# Patient Record
Sex: Female | Born: 1975
Health system: Southern US, Community
[De-identification: ages and names within clinical notes are randomized; demographics above are authoritative.]

## PROBLEM LIST (undated history)

## (undated) ENCOUNTER — Inpatient Hospital Stay: Admission: EM | Payer: Self-pay

## (undated) DIAGNOSIS — F419 Anxiety disorder, unspecified: Secondary | ICD-10-CM

## (undated) DIAGNOSIS — F329 Major depressive disorder, single episode, unspecified: Secondary | ICD-10-CM

## (undated) DIAGNOSIS — G709 Myoneural disorder, unspecified: Secondary | ICD-10-CM

## (undated) DIAGNOSIS — M1990 Unspecified osteoarthritis, unspecified site: Secondary | ICD-10-CM

## (undated) DIAGNOSIS — N946 Dysmenorrhea, unspecified: Secondary | ICD-10-CM

## (undated) DIAGNOSIS — E669 Obesity, unspecified: Secondary | ICD-10-CM

## (undated) DIAGNOSIS — I10 Essential (primary) hypertension: Secondary | ICD-10-CM

## (undated) DIAGNOSIS — G8929 Other chronic pain: Secondary | ICD-10-CM

## (undated) DIAGNOSIS — T7840XA Allergy, unspecified, initial encounter: Secondary | ICD-10-CM

## (undated) DIAGNOSIS — F319 Bipolar disorder, unspecified: Secondary | ICD-10-CM

## (undated) DIAGNOSIS — F32A Depression, unspecified: Secondary | ICD-10-CM

## (undated) HISTORY — DX: Obesity, unspecified: E66.9

## (undated) HISTORY — DX: Major depressive disorder, single episode, unspecified: F32.9

## (undated) HISTORY — DX: Depression, unspecified: F32.A

## (undated) HISTORY — PX: WISDOM TOOTH EXTRACTION: SHX21

## (undated) HISTORY — DX: Dysmenorrhea, unspecified: N94.6

## (undated) HISTORY — DX: Anxiety disorder, unspecified: F41.9

## (undated) HISTORY — DX: Myoneural disorder, unspecified: G70.9

## (undated) HISTORY — DX: Allergy, unspecified, initial encounter: T78.40XA

## (undated) HISTORY — DX: Unspecified osteoarthritis, unspecified site: M19.90

## (undated) HISTORY — PX: SPINE SURGERY: SHX786

## (undated) HISTORY — DX: Other chronic pain: G89.29

---

## 2001-09-21 HISTORY — PX: KNEE ARTHROSCOPY: SUR90

## 2005-09-21 HISTORY — PX: SHOULDER ARTHROSCOPY: SHX128

## 2005-09-21 HISTORY — PX: KNEE ARTHROSCOPY: SUR90

## 2005-09-21 HISTORY — PX: KIDNEY STONE SURGERY: SHX686

## 2009-09-29 ENCOUNTER — Emergency Department (HOSPITAL_COMMUNITY): Admission: EM | Admit: 2009-09-29 | Discharge: 2009-09-29 | Payer: Self-pay | Admitting: Family Medicine

## 2009-12-01 ENCOUNTER — Emergency Department (HOSPITAL_COMMUNITY): Admission: EM | Admit: 2009-12-01 | Discharge: 2009-12-01 | Payer: Self-pay | Admitting: Emergency Medicine

## 2010-04-23 ENCOUNTER — Emergency Department (HOSPITAL_COMMUNITY): Admission: EM | Admit: 2010-04-23 | Discharge: 2010-04-23 | Payer: Self-pay | Admitting: Family Medicine

## 2010-05-21 ENCOUNTER — Emergency Department (HOSPITAL_COMMUNITY): Admission: EM | Admit: 2010-05-21 | Discharge: 2010-05-21 | Payer: Self-pay | Admitting: Family Medicine

## 2010-07-04 ENCOUNTER — Ambulatory Visit: Payer: Self-pay | Admitting: Internal Medicine

## 2010-07-04 LAB — CONVERTED CEMR LAB
ALT: 54 units/L — ABNORMAL HIGH (ref 0–35)
AST: 35 units/L (ref 0–37)
Albumin: 4.7 g/dL (ref 3.5–5.2)
CO2: 26 meq/L (ref 19–32)
Calcium: 10.8 mg/dL — ABNORMAL HIGH (ref 8.4–10.5)
Creatinine, Ser: 0.78 mg/dL (ref 0.40–1.20)
Glucose, Bld: 84 mg/dL (ref 70–99)
Total Bilirubin: 0.3 mg/dL (ref 0.3–1.2)
Total Protein: 7.4 g/dL (ref 6.0–8.3)

## 2010-07-31 ENCOUNTER — Emergency Department (HOSPITAL_COMMUNITY)
Admission: EM | Admit: 2010-07-31 | Discharge: 2010-07-31 | Payer: Self-pay | Source: Home / Self Care | Admitting: Family Medicine

## 2010-11-10 ENCOUNTER — Inpatient Hospital Stay (INDEPENDENT_AMBULATORY_CARE_PROVIDER_SITE_OTHER)
Admission: RE | Admit: 2010-11-10 | Discharge: 2010-11-10 | Disposition: A | Discharge status: Home / Self Care | Payer: Self-pay | Source: Ambulatory Visit | Attending: Emergency Medicine | Admitting: Emergency Medicine

## 2010-11-10 DIAGNOSIS — L259 Unspecified contact dermatitis, unspecified cause: Secondary | ICD-10-CM

## 2010-11-10 DIAGNOSIS — J019 Acute sinusitis, unspecified: Secondary | ICD-10-CM

## 2010-11-10 LAB — POCT URINALYSIS DIPSTICK: Specific Gravity, Urine: 1.025 (ref 1.005–1.030)

## 2010-11-10 LAB — COMPREHENSIVE METABOLIC PANEL
ALT: 32 U/L (ref 0–35)
Alkaline Phosphatase: 51 U/L (ref 39–117)
Chloride: 106 mEq/L (ref 96–112)
Creatinine, Ser: 0.66 mg/dL (ref 0.4–1.2)
GFR calc non Af Amer: >60 mL/min (ref >60)
Glucose, Bld: 110 mg/dL — ABNORMAL HIGH (ref 70–99)
Total Bilirubin: 0.4 mg/dL (ref 0.3–1.2)
Total Protein: 7.2 g/dL (ref 6.0–8.3)

## 2010-11-10 LAB — DIFFERENTIAL
Basophils Absolute: 0.1 10*3/uL (ref 0.0–0.1)
Basophils Relative: 0 % (ref 0–1)
Eosinophils Absolute: 0.4 10*3/uL (ref 0.0–0.7)
Eosinophils Relative: 3 % (ref 0–5)
Lymphocytes Relative: 20 % (ref 12–46)
Lymphs Abs: 2.5 10*3/uL (ref 0.7–4.0)
Monocytes Relative: 7 % (ref 3–12)
Neutro Abs: 8.7 10*3/uL — ABNORMAL HIGH (ref 1.7–7.7)

## 2010-11-10 LAB — CBC
MCH: 28 pg (ref 26.0–34.0)
RBC: 4.78 MIL/uL (ref 3.87–5.11)
WBC: 12.5 10*3/uL — ABNORMAL HIGH (ref 4.0–10.5)

## 2010-12-05 LAB — POCT I-STAT, CHEM 8
HCT: 43 % (ref 36.0–46.0)
TCO2: 24 mmol/L (ref 0–100)

## 2010-12-15 LAB — POCT RAPID STREP A (OFFICE): Streptococcus, Group A Screen (Direct): NEGATIVE

## 2011-02-17 ENCOUNTER — Other Ambulatory Visit: Payer: Self-pay | Admitting: Internal Medicine

## 2011-02-17 NOTE — Telephone Encounter (Addendum)
Is this our patient?  Previous patient of Dr. Reche Dixon at [facility].  Never seen in [facility].  Prescription refill request was refused and sent back to the pharmacy with reason.

## 2011-07-15 ENCOUNTER — Inpatient Hospital Stay (INDEPENDENT_AMBULATORY_CARE_PROVIDER_SITE_OTHER)
Admission: RE | Admit: 2011-07-15 | Discharge: 2011-07-15 | Disposition: A | Discharge status: Home / Self Care | Payer: Self-pay | Source: Ambulatory Visit | Attending: Family Medicine | Admitting: Family Medicine

## 2011-07-15 DIAGNOSIS — G43009 Migraine without aura, not intractable, without status migrainosus: Secondary | ICD-10-CM

## 2011-10-28 ENCOUNTER — Encounter (HOSPITAL_COMMUNITY): Payer: Self-pay | Admitting: Emergency Medicine

## 2011-10-28 ENCOUNTER — Emergency Department (INDEPENDENT_AMBULATORY_CARE_PROVIDER_SITE_OTHER)
Admission: EM | Admit: 2011-10-28 | Discharge: 2011-10-28 | Disposition: A | Discharge status: Home / Self Care | Payer: Self-pay | Source: Home / Self Care

## 2011-10-28 DIAGNOSIS — R05 Cough: Secondary | ICD-10-CM

## 2011-10-28 DIAGNOSIS — R059 Cough, unspecified: Secondary | ICD-10-CM

## 2011-10-28 DIAGNOSIS — J019 Acute sinusitis, unspecified: Secondary | ICD-10-CM

## 2011-10-28 HISTORY — DX: Essential (primary) hypertension: I10

## 2011-10-28 MED ORDER — AMOXICILLIN 875 MG PO TABS: 875.0000 mg | ORAL_TABLET | Freq: Two times a day (BID) | ORAL | Status: AC

## 2011-10-28 NOTE — ED Provider Notes (Signed)
History     CSN: [id number]  Arrival date & time 10/28/11  1026   None     Chief Complaint  Patient presents with  . Sinusitis  . URI    (Consider location/radiation/quality/duration/timing/severity/associated sxs/prior treatment) HPI Comments: Patient presents with c/o sinus pressure, nasal congestion, post nasal drainage and cough x 1 week. Her chest hurts with deep breath only. Cough comes in "coughing fits" and is usually nonproductive. Her throat is sore from post nasal drainage and her ears are also beginning to hurt. She has been taking an over the counter decongestant without improvement.    Past Medical History  Diagnosis Date  . Hypertension     Past Surgical History  Procedure Date  . Shoulder arthroscopy   . Knee arthroscopy   . Kidney stone surgery     History reviewed. No pertinent family history.  History  Substance Use Topics  . Smoking status: Never Smoker   . Smokeless tobacco: Not on file  . Alcohol Use: No    OB History    Grav Para Term Preterm Abortions TAB SAB Ect Mult Living                  Review of Systems  Constitutional: Negative for fever, chills and fatigue.  HENT: Positive for ear pain, congestion, sore throat, rhinorrhea, postnasal drip and sinus pressure.   Respiratory: Positive for cough. Negative for shortness of breath and wheezing.   Cardiovascular: Negative for chest pain and palpitations.    Allergies  Clindamycin/lincomycin and Nubain  Home Medications   Current Outpatient Rx  Name Route Sig Dispense Refill  . CLONIDINE HCL 0.1 MG PO TABS Oral Take 0.1 mg by mouth 2 (two) times daily. RAN OUT    . CLOZAPINE 100 MG PO TABS Oral Take 100 mg by mouth daily.    Marland Kitchen GABAPENTIN 100 MG PO CAPS Oral Take 100 mg by mouth 3 (three) times daily.    Marland Kitchen LAMOTRIGINE 200 MG PO TABS Oral Take 200 mg by mouth daily.    Marland Kitchen LITHIUM CARBONATE 600 MG PO CAPS Oral Take 600 mg by mouth 3 (three) times daily with meals.    Marland Kitchen QUETIAPINE  FUMARATE 200 MG PO TABS Oral Take 200 mg by mouth at bedtime.    . AMOXICILLIN 875 MG PO TABS Oral Take 1 tablet (875 mg total) by mouth 2 (two) times daily. 20 tablet 0    BP 137/82  Pulse 100  Temp(Src) 98.2 F (36.8 C) (Oral)  Resp 16  SpO2 98%  LMP 09/14/2011  Physical Exam  Nursing note and vitals reviewed. Constitutional: She appears well-developed and well-nourished. No distress.  HENT:  Head: Normocephalic and atraumatic.  Right Ear: Tympanic membrane, external ear and ear canal normal.  Left Ear: Tympanic membrane, external ear and ear canal normal.  Nose: Mucosal edema (severe) present. Right sinus exhibits maxillary sinus tenderness. Left sinus exhibits maxillary sinus tenderness.  Mouth/Throat: Uvula is midline, oropharynx is clear and moist and mucous membranes are normal. No oropharyngeal exudate, posterior oropharyngeal edema or posterior oropharyngeal erythema.  Neck: Neck supple.  Cardiovascular: Normal rate, regular rhythm and normal heart sounds.   Pulmonary/Chest: Effort normal and breath sounds normal. No respiratory distress.  Lymphadenopathy:    She has no cervical adenopathy.  Neurological: She is alert.  Skin: Skin is warm and dry.  Psychiatric: She has a normal mood and affect.    ED Course  Procedures (including critical care time)  Labs Reviewed -  No data to display No results found.   1. Acute sinusitis   2. Cough       MDM          Melody Comas, PA 10/28/11 1128

## 2011-10-28 NOTE — ED Notes (Signed)
PT HERE WITH SINUS SX THAT STARTED LAST WEEK WITH SORE THROAT,INTERMITT COUGH WITH MIN GREEN PHLEGM THROB H/A AND NOW BILAT EARS DISCOMFORT.PT ALSO REPORTS OF FREQ CHEST PRESSURE WITH REST OR MOVEMENT.DENIES VOMITING BUT C/O NAUSEA.PT HAS BEEN TAKING OTC SINUS COLD MEDS BUT NO RELIEF

## 2011-11-02 NOTE — ED Provider Notes (Signed)
Medical screening examination/treatment/procedure(s) were performed by non-physician practitioner and as supervising physician I was immediately available for consultation/collaboration.  Corrie Mckusick, MD 11/02/11 1433

## 2011-11-17 ENCOUNTER — Emergency Department (INDEPENDENT_AMBULATORY_CARE_PROVIDER_SITE_OTHER)
Admission: EM | Admit: 2011-11-17 | Discharge: 2011-11-17 | Disposition: A | Discharge status: Home / Self Care | Payer: Self-pay | Source: Home / Self Care | Attending: Emergency Medicine | Admitting: Emergency Medicine

## 2011-11-17 ENCOUNTER — Encounter (HOSPITAL_COMMUNITY): Payer: Self-pay | Admitting: Emergency Medicine

## 2011-11-17 DIAGNOSIS — J040 Acute laryngitis: Secondary | ICD-10-CM

## 2011-11-17 DIAGNOSIS — J329 Chronic sinusitis, unspecified: Secondary | ICD-10-CM

## 2011-11-17 HISTORY — DX: Bipolar disorder, unspecified: F31.9

## 2011-11-17 LAB — POCT RAPID STREP A: Streptococcus, Group A Screen (Direct): NEGATIVE

## 2011-11-17 MED ORDER — CEFDINIR 300 MG PO CAPS: 300.0000 mg | ORAL_CAPSULE | Freq: Two times a day (BID) | ORAL | Status: AC

## 2011-11-17 NOTE — ED Notes (Signed)
Repositioned exam table, offered blankets, given pillow/dimmed lights

## 2011-11-17 NOTE — Discharge Instructions (Signed)
Laryngitis At the top of your windpipe is your voice box. It is the source of your voice. Inside your voice box are 2 bands of muscles called vocal cords. When you breathe, your vocal cords are relaxed and open so that air can get into the lungs. When you decide to say something, these cords come together and vibrate. The sound from these vibrations goes into your throat and comes out through your mouth as sound. Laryngitis is an inflammation of the vocal cords that causes hoarseness, cough, loss of voice, sore throat, and dry throat. Laryngitis can often be related to a viral infection, excessive smoking, excessive talking or yelling, inhalation of toxic fumes, allergies, and reflux of acid from your stomach. CAUSES Laryngitis can be temporary (acute) or long-term (chronic). Most cases of acute laryngitis improve with time. The typical cause of acute laryngitis is viral infection, vocal strain, measles or mumps, or bacterial infections. Chronic laryngitis lasts for more than 3 weeks. It is usually caused by vocal cord strain, vocal cord injury, postnasal drip, growths on the vocal cords, or acid reflux. RISK FACTORS  Respiratory infections.   Exposure to irritating substances, such as cigarette smoke, excessive amounts of alcohol, stomach acids, and workplace chemicals.   Voice trauma, such as vocal cord injury from shouting or speaking too loud.  DIAGNOSIS  The most common sign of laryngitis is hoarseness. This can range from partial to total loss of your voice. Laryngoscopy may be necessary. This allows your caregiver to look into the larynx in order to diagnose your condition. HOME CARE INSTRUCTIONS  Drink enough fluids to keep your urine clear or pale yellow.   Rest until you no longer have symptoms or as directed by your caregiver.   Breathe in moist air.   Take all medicine as directed by your caregiver.   Do not smoke.   Talk as little as possible (this includes whispering).    Write on paper instead of talking until your voice is back to normal.   Follow up with your caregiver if your condition has not improved after 10 days.  SEEK MEDICAL CARE IF:   You have trouble breathing.   You cough up blood.   You have persistent fever.   You have increasing pain.   You have difficulty swallowing.  Document Released: 09/07/2005 Document Revised: 05/20/2011 Document Reviewed: 11/13/2010 [facility] Patient Information 2012 [place], [place].Sinusitis Sinuses are air pockets within the bones of your face. The growth of bacteria within a sinus leads to infection. The infection prevents the sinuses from draining. This infection is called sinusitis. SYMPTOMS  There will be different areas of pain depending on which sinuses have become infected.  The maxillary sinuses often produce pain beneath the eyes.   Frontal sinusitis may cause pain in the middle of the forehead and above the eyes.  Other problems (symptoms) include:  Toothaches.   Colored, pus-like (purulent) drainage from the nose.   Swelling, warmth, and tenderness over the sinus areas may be signs of infection.  TREATMENT  Sinusitis is most often determined by an exam.X-rays may be taken. If x-rays have been taken, make sure you obtain your results or find out how you are to obtain them. Your caregiver may give you medications (antibiotics). These are medications that will help kill the bacteria causing the infection. You may also be given a medication (decongestant) that helps to reduce sinus swelling.  HOME CARE INSTRUCTIONS   Only take over-the-counter or prescription medicines for pain, discomfort, or  fever as directed by your caregiver.   Drink extra fluids. Fluids help thin the mucus so your sinuses can drain more easily.   Applying either moist heat or ice packs to the sinus areas may help relieve discomfort.   Use saline nasal sprays to help moisten your sinuses. The sprays can be found at your  local drugstore.  SEEK IMMEDIATE MEDICAL CARE IF:  You have a fever.   You have increasing pain, severe headaches, or toothache.   You have nausea, vomiting, or drowsiness.   You develop unusual swelling around the face or trouble seeing.  MAKE SURE YOU:   Understand these instructions.   Will watch your condition.   Will get help right away if you are not doing well or get worse.  Document Released: 09/07/2005 Document Revised: 05/20/2011 Document Reviewed: 04/06/2007 [facility] Patient Information 2012 [place], [place].

## 2011-11-17 NOTE — ED Provider Notes (Signed)
Chief Complaint  Patient presents with  . Sore Throat    History of Present Illness:   The patient has had a six-day history of a sore throat, hoarseness, sore ears, headache, and a slight cough. She had the same thing about 2 weeks ago and was told she had sinusitis. She was given amoxicillin and finished this up. She feels better, but doesn't feel like her symptoms have completely gone away. She denies fever, chills, nausea, vomiting, or diarrhea. She still does have some nasal congestion and drainage.  Review of Systems:  Other than noted above, the patient denies any of the following symptoms. Systemic:  No fever, chills, sweats, fatigue, myalgias, headache, or anorexia. Eye:  No redness, pain or drainage. ENT:  No earache, nasal congestion, rhinorrhea, sinus pressure, or sore throat. Lungs:  No cough, sputum production, wheezing, shortness of breath. Or chest pain. GI:  No nausea, vomiting, abdominal pain or diarrhea. Skin:  No rash or itching.  PMFSH:  Past medical history, family history, social history, meds, and allergies were reviewed.  Physical Exam:   Vital signs:  BP 125/78  Pulse 81  Temp(Src) 98.5 F (36.9 C) (Oral)  Resp 18  SpO2 99%  LMP 10/14/2010 General:  Alert, in no distress. Eye:  No conjunctival injection or drainage. ENT:  TMs and canals were normal, without erythema or inflammation.  Nasal mucosa was clear and uncongested, without drainage.  Mucous membranes were moist.  Pharynx was clear, without exudate or drainage.  There were no oral ulcerations or lesions. Neck:  Supple, no adenopathy, tenderness or mass. Lungs:  No respiratory distress.  Lungs were clear to auscultation, without wheezes, rales or rhonchi.  Breath sounds were clear and equal bilaterally. Heart:  Regular rhythm, without gallops, murmers or rubs. Skin:  Clear, warm, and dry, without rash or lesions.  Labs:   Results for orders placed during the hospital encounter of 11/17/11  POCT RAPID  STREP A ([facility] URG CARE ONLY)      Component Value Range   Streptococcus, Group A Screen (Direct) NEGATIVE  NEGATIVE      Radiology:  No results found.  Assessment:   Diagnoses that have been ruled out:  None  Diagnoses that are still under consideration:  None  Final diagnoses:  Laryngitis  Sinusitis      Plan:   1.  The following meds were prescribed:   New Prescriptions   CEFDINIR (OMNICEF) 300 MG CAPSULE    Take 1 capsule (300 mg total) by mouth 2 (two) times daily.   2.  The patient was instructed in symptomatic care and handouts were given. 3.  The patient was told to return if becoming worse in any way, if no better in 3 or 4 days, and given some red flag symptoms that would indicate earlier return.   Roque Lias, MD 11/17/11 1710

## 2011-11-17 NOTE — ED Notes (Signed)
Reports onset of symptoms was 11/14/2011.  Reports sore throat, headache, laryngitis.

## 2011-11-24 ENCOUNTER — Other Ambulatory Visit: Payer: Self-pay | Admitting: Family Medicine

## 2012-02-01 ENCOUNTER — Encounter: Payer: Self-pay | Admitting: Physical Medicine and Rehabilitation

## 2012-02-05 ENCOUNTER — Ambulatory Visit (HOSPITAL_BASED_OUTPATIENT_CLINIC_OR_DEPARTMENT_OTHER): Payer: Self-pay | Attending: Family Medicine

## 2012-02-05 VITALS — Ht 65.0 in | Wt 255.0 lb

## 2012-02-05 DIAGNOSIS — G4733 Obstructive sleep apnea (adult) (pediatric): Secondary | ICD-10-CM | POA: Insufficient documentation

## 2012-02-13 DIAGNOSIS — G4733 Obstructive sleep apnea (adult) (pediatric): Secondary | ICD-10-CM

## 2012-02-14 NOTE — Procedures (Signed)
NAME:  Maria Powers, Maria Powers NO.:  [id number]  MEDICAL RECORD NO.:  [id number]          PATIENT TYPE:  OUT  LOCATION:  SLEEP CENTER                 FACILITY[facility]  [facility]  PHYSICIAN:  Veasna Santibanez D. Maple Hudson, MD, FCCP, FACPDATE OF BIRTH:  05-11-1977  DATE OF STUDY:  02/05/2012                           NOCTURNAL POLYSOMNOGRAM  REFERRING PHYSICIAN:  Jaclyn Shaggy, MD  REFERRING PHYSICIAN:  Jaclyn Shaggy, MD  INDICATION FOR STUDY:  Hypersomnia with sleep apnea.  EPWORTH SLEEPINESS SCORE:  4/24.  BMI 42.4, weight 255 pounds, height 65 inches, neck 16 inches.  MEDICATIONS:  Home medications are charted and reviewed.  SLEEP ARCHITECTURE:  Total sleep time 391 minute with sleep efficiency 91.8%.  Stage I was 5.5%, stage II, 80.7%, stage III absent.  REM 13.8% of total sleep time.  Sleep latency 20.5 minutes.  REM latency 209 minutes.  Awake after sleep onset 15 minutes.  Arousal index 5.2.  BEDTIME MEDICATION:  Lithium, Seroquel, Lamictal, Neurontin, clonazepam, clonidine, Lo/Ovral, tramadol.  RESPIRATORY DATA:  Apnea-hypopnea index (AHI) 1.1 per hour.  A total of 7 events were scored all as hypopneas associated with supine sleep position and REM.  REM AHI 5.6 per hour.  There were insufficient numbers of events to meet protocol requirements for initiation of split protocol CPAP titration on this study night.  OXYGEN DATA:  Moderately loud snoring with oxygen desaturation to a nadir of 88% and mean oxygen saturation through the study of 95.4% on room air.  CARDIAC DATA:  Sinus rhythm with occasional PVC.  MOVEMENT-PARASOMNIA:  No significant movement disturbance.  No bathroom trips.  IMPRESSIONS-RECOMMENDATIONS: 1. Occasional respiratory event with sleep disturbance, within normal     limits.  AHI 1.1 per hour (the normal range for adults is from 0-5     events per hour).  Moderately loud snoring with oxygen desaturation     to a nadir of 88% and mean oxygen saturation through  the study of     95.4% on room air. 2. Her complaint is of daytime sleepiness and difficulty     concentrating.  Consider effects of her significant medication     list.     Prithvi Kooi D. Maple Hudson, MD, [facility] [place], FACP Diplomate, American Board of Sleep Medicine    CDY/MEDQ  D:  02/13/2012 16:18:00  T:  02/14/2012 00:20:05  Job:  [id number]

## 2012-05-18 ENCOUNTER — Encounter: Payer: Self-pay | Admitting: Physical Medicine and Rehabilitation

## 2012-05-18 ENCOUNTER — Ambulatory Visit (HOSPITAL_COMMUNITY)
Admission: RE | Admit: 2012-05-18 | Discharge: 2012-05-18 | Disposition: A | Discharge status: Home / Self Care | Payer: Self-pay | Source: Ambulatory Visit | Attending: Physical Medicine and Rehabilitation | Admitting: Physical Medicine and Rehabilitation

## 2012-05-18 ENCOUNTER — Encounter: Payer: Self-pay | Attending: Physical Medicine and Rehabilitation | Admitting: Physical Medicine and Rehabilitation

## 2012-05-18 VITALS — BP 152/97 | HR 78 | Resp 14 | Ht 64.0 in | Wt 241.0 lb

## 2012-05-18 DIAGNOSIS — I10 Essential (primary) hypertension: Secondary | ICD-10-CM | POA: Insufficient documentation

## 2012-05-18 DIAGNOSIS — M545 Low back pain, unspecified: Secondary | ICD-10-CM | POA: Insufficient documentation

## 2012-05-18 DIAGNOSIS — M5144 Schmorl's nodes, thoracic region: Secondary | ICD-10-CM | POA: Insufficient documentation

## 2012-05-18 DIAGNOSIS — M79604 Pain in right leg: Secondary | ICD-10-CM | POA: Insufficient documentation

## 2012-05-18 DIAGNOSIS — Q7649 Other congenital malformations of spine, not associated with scoliosis: Secondary | ICD-10-CM | POA: Insufficient documentation

## 2012-05-18 DIAGNOSIS — F319 Bipolar disorder, unspecified: Secondary | ICD-10-CM | POA: Insufficient documentation

## 2012-05-18 MED ORDER — DICLOFENAC SODIUM 1 % TD GEL: 2.0000 g | Freq: Four times a day (QID) | TRANSDERMAL | Status: DC

## 2012-05-18 NOTE — Patient Instructions (Signed)
Advised patient to walk , start walking for about 15 min, and then slowly increase as tolerated.

## 2012-05-18 NOTE — Progress Notes (Signed)
Subjective:    Patient ID: Maria Powers, female    DOB: 1975/09/27, 36 y.o.   MRN: [id number]  HPI The patient is a 36 year old female, who presents with LBP which radiates into the right posterior hip . The symptoms started 13 years ago, when she got pregnant. The patient complains about moderate to severe pain, which has gotten worse in the last couple month, but now she is back to baseline. She describes the pain as dull and aching pain . Applying heat,or ice, taking medications , changing positions alleviate the symptoms. Prolonged sitting aggrevates the symptoms. The patient grades her pain as a 2 /10. Patient takes Tramadol and Flexeril 1-2 times per day. Had ESI, with no success, triggerpoint injections helped some. Has not tried PT. Last MRI, X-ray over 36 years old. Pain Inventory Average Pain 2 Pain Right Now 2 My pain is intermittent  In the last 24 hours, has pain interfered with the following? General activity 0 Relation with others 0 Enjoyment of life 0 What TIME of day is your pain at its worst? varies Sleep (in general) Fair  Pain is worse with: some activites Pain improves with: not sure Relief from Meds: none  Mobility walk without assistance how many minutes can you walk? 10-15 ability to climb steps?  yes do you drive?  yes Do you have any goals in this area?  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  no  Neuro/Psych anxiety  Prior Studies Any changes since last visit?  no  Physicians involved in your care Any changes since last visit?  no   Family History  Problem Relation Age of Onset  . Addison's disease Mother   . Diabetes Mother   . Bipolar disorder Mother    History   Social History  . Marital Status: Single    Spouse Name: N/A    Number of Children: N/A  . Years of Education: N/A   Social History Main Topics  . Smoking status: Never Smoker   . Smokeless tobacco: None  .  Alcohol Use: No  . Drug Use: No  . Sexually Active:    Other Topics Concern  . None   Social History Narrative  . None   Past Surgical History  Procedure Date  . Shoulder arthroscopy   . Knee arthroscopy   . Kidney stone surgery    Past Medical History  Diagnosis Date  . Hypertension   . Bipolar 1 disorder    BP 152/97  Pulse 78  Resp 14  Ht 5\' 4"  (1.626 m)  Wt 241 lb (109.317 kg)  BMI 41.37 kg/m2  SpO2 100%     Review of Systems  Musculoskeletal: Positive for myalgias, back pain and arthralgias.  Psychiatric/Behavioral: The patient is nervous/anxious.   All other systems reviewed and are negative.       Objective:   Physical Exam  Constitutional: She is oriented to person, place, and time. She appears well-developed and well-nourished.  HENT:  Head: Normocephalic.  Neck: Neck supple.  Musculoskeletal: She exhibits tenderness.  Neurological: She is alert and oriented to person, place, and time.  Skin: Skin is warm and dry.  Psychiatric: She has a normal mood and affect.    Symmetric normal motor tone is noted throughout. Normal muscle bulk. Muscle testing reveals 5/5 muscle strength of the upper extremity, and 5/5 of the lower extremity. Full range of motion in upper and lower  extremities. ROM of spine is not restricted. Fine motor movements are normal in both hands. Sensory is intact and symmetric to light touch, pinprick and proprioception. DTR in the upper and lower extremity are present and symmetric 2+. No clonus is noted.  Patient arises from chair without difficulty. Narrow based gait with normal arm swing bilateral , able to stand on heels and toes . Tandem walk is stable. No pronator drift. Rhomberg negative. Good finger to nose and heel to shin testing. No tremor, dystaxia or dysmetria noted.  Patient is obese and deconditioned.       Assessment & Plan:  This is a 36 year old female female with 1.LBP 2.Bipolar   Plan : X-ray of L-spine,  consider treatment after, consider PT. Prescribed Voltaren gel for intermittend inflammation/ exacerbation, of L-spine/SI joint. Patient is on Lithium, considered prescribing an oral anti-inflammatory, but there is too much interaction, risk of Lithium toxicity. Follow up in 1 month

## 2012-06-07 ENCOUNTER — Emergency Department (INDEPENDENT_AMBULATORY_CARE_PROVIDER_SITE_OTHER): Payer: Self-pay

## 2012-06-07 ENCOUNTER — Emergency Department (INDEPENDENT_AMBULATORY_CARE_PROVIDER_SITE_OTHER)
Admission: EM | Admit: 2012-06-07 | Discharge: 2012-06-07 | Disposition: A | Discharge status: Home / Self Care | Payer: Self-pay | Source: Home / Self Care | Attending: Family Medicine | Admitting: Family Medicine

## 2012-06-07 ENCOUNTER — Encounter (HOSPITAL_COMMUNITY): Payer: Self-pay | Admitting: *Deleted

## 2012-06-07 DIAGNOSIS — S93609A Unspecified sprain of unspecified foot, initial encounter: Secondary | ICD-10-CM

## 2012-06-07 DIAGNOSIS — S93602A Unspecified sprain of left foot, initial encounter: Secondary | ICD-10-CM

## 2012-06-07 MED ORDER — DICLOFENAC POTASSIUM 50 MG PO TABS: 50.0000 mg | ORAL_TABLET | Freq: Three times a day (TID) | ORAL | Status: DC

## 2012-06-07 NOTE — ED Notes (Signed)
Pt  Reports  Symptoms  Of  Headache   l  Earache         With  Pain      Pain l  Side face /  Head    No  Vomiting  Pt  Reports  Symptoms  Since  Beginning of  Last  Month        -  She  Also  Reports pain l  Foot         -  Pt  Ambulatory  On affected  Foot

## 2012-06-07 NOTE — ED Notes (Signed)
Large  Female  Ortho  Shoe  Applied  To l  Foot

## 2012-06-07 NOTE — ED Provider Notes (Signed)
History     CSN: [id number]  Arrival date & time 06/07/12  1421   First MD Initiated Contact with Patient 06/07/12 1430      Chief Complaint  Patient presents with  . Headache    (Consider location/radiation/quality/duration/timing/severity/associated sxs/prior treatment) Patient is a 36 y.o. female presenting with lower extremity pain. The history is provided by the patient.  Foot Pain This is a new problem. The current episode started more than 1 week ago (for 1.5 mo , since being at beach--twisted in ocean). The problem has been gradually worsening. The symptoms are aggravated by walking.    Past Medical History  Diagnosis Date  . Hypertension   . Bipolar 1 disorder     Past Surgical History  Procedure Date  . Shoulder arthroscopy   . Knee arthroscopy   . Kidney stone surgery     Family History  Problem Relation Age of Onset  . Addison's disease Mother   . Diabetes Mother   . Bipolar disorder Mother     History  Substance Use Topics  . Smoking status: Never Smoker   . Smokeless tobacco: Not on file  . Alcohol Use: No    OB History    Grav Para Term Preterm Abortions TAB SAB Ect Mult Living                  Review of Systems  Constitutional: Negative.   Musculoskeletal: Positive for gait problem.    Allergies  Clindamycin/lincomycin and Nubain  Home Medications   Current Outpatient Rx  Name Route Sig Dispense Refill  . CLONAZEPAM 1 MG PO TABS Oral Take 1 mg by mouth 2 (two) times daily as needed.    Marland Kitchen CLONIDINE HCL 0.1 MG PO TABS Oral Take 0.2 mg by mouth 2 (two) times daily. RAN OUT    . CYCLOBENZAPRINE HCL 10 MG PO TABS Oral Take 10 mg by mouth 2 (two) times daily as needed.    Marland Kitchen DICLOFENAC POTASSIUM 50 MG PO TABS Oral Take 1 tablet (50 mg total) by mouth 3 (three) times daily. As needed for pain 30 tablet 0  . DICLOFENAC SODIUM 1 % TD GEL Topical Apply 2 g topically 4 (four) times daily. 2 Tube 2  . GABAPENTIN 100 MG PO CAPS Oral Take 100 mg  by mouth 3 (three) times daily.    Marland Kitchen LAMOTRIGINE 200 MG PO TABS Oral Take 150 mg by mouth 2 (two) times daily.     Marland Kitchen LITHIUM CARBONATE 600 MG PO CAPS Oral Take 300 mg by mouth 2 (two) times daily.     Marland Kitchen NORGESTREL-ETHINYL ESTRADIOL 0.3-30 MG-MCG PO TABS Oral Take 1 tablet by mouth daily.    . QUETIAPINE FUMARATE 200 MG PO TABS Oral Take 200 mg by mouth at bedtime.    . TRAMADOL HCL 50 MG PO TABS Oral Take 50 mg by mouth every 6 (six) hours as needed.      BP 151/78  Pulse 92  Temp 98.1 F (36.7 C) (Oral)  Resp 18  SpO2 100%  LMP 05/31/2012  Physical Exam  Nursing note and vitals reviewed. Constitutional: She appears well-developed and well-nourished.  Musculoskeletal: She exhibits tenderness.       Feet:  Skin: Skin is warm and dry.    ED Course  Procedures (including critical care time)  Labs Reviewed - No data to display Dg Foot Complete Left  06/07/2012  *RADIOLOGY REPORT*  Clinical Data: Left foot pain  LEFT FOOT - COMPLETE  3+ VIEW  Comparison: 12/01/2009  Findings: Negative for fracture.  Normal alignment.  No significant degenerative change or erosion is identified.  Joint spaces are maintained.  IMPRESSION: Negative   Original Report Authenticated By: Camelia Phenes, M.D.      1. Sprain of foot, left       MDM          Linna Hoff, MD 06/07/12 [phone]

## 2012-06-15 ENCOUNTER — Encounter: Payer: Self-pay | Admitting: Physical Medicine and Rehabilitation

## 2012-08-10 ENCOUNTER — Emergency Department (HOSPITAL_COMMUNITY): Admit: 2012-08-10 | Payer: Self-pay

## 2012-08-10 ENCOUNTER — Encounter (HOSPITAL_COMMUNITY): Payer: Self-pay | Admitting: Emergency Medicine

## 2012-08-10 ENCOUNTER — Emergency Department (INDEPENDENT_AMBULATORY_CARE_PROVIDER_SITE_OTHER)
Admission: EM | Admit: 2012-08-10 | Discharge: 2012-08-10 | Disposition: A | Discharge status: Home / Self Care | Payer: Self-pay | Source: Home / Self Care

## 2012-08-10 DIAGNOSIS — M79604 Pain in right leg: Secondary | ICD-10-CM

## 2012-08-10 DIAGNOSIS — I10 Essential (primary) hypertension: Secondary | ICD-10-CM

## 2012-08-10 DIAGNOSIS — Z23 Encounter for immunization: Secondary | ICD-10-CM

## 2012-08-10 DIAGNOSIS — M545 Low back pain: Secondary | ICD-10-CM

## 2012-08-10 DIAGNOSIS — F319 Bipolar disorder, unspecified: Secondary | ICD-10-CM

## 2012-08-10 DIAGNOSIS — F419 Anxiety disorder, unspecified: Secondary | ICD-10-CM

## 2012-08-10 MED ORDER — CLONIDINE HCL 0.2 MG PO TABS: 0.2000 mg | ORAL_TABLET | Freq: Two times a day (BID) | ORAL | Status: DC

## 2012-08-10 MED ORDER — TRAMADOL HCL 50 MG PO TABS: 50.0000 mg | ORAL_TABLET | Freq: Four times a day (QID) | ORAL | Status: DC | PRN

## 2012-08-10 MED ORDER — CYCLOBENZAPRINE HCL 10 MG PO TABS: 10.0000 mg | ORAL_TABLET | Freq: Two times a day (BID) | ORAL | Status: DC | PRN

## 2012-08-10 MED ORDER — INFLUENZA VIRUS VACC SPLIT PF IM SUSP
0.5000 mL | Freq: Once | INTRAMUSCULAR | Status: AC
  Administered 2012-08-10: 0.5 mL via INTRAMUSCULAR

## 2012-08-10 NOTE — ED Provider Notes (Signed)
History     CSN: [id number]  Arrival date & time 08/10/12  1336   None    CC: out of medications  HPI Pt presents for refills for medications.  She has bipolar disorder.  She says that she has DDD of lumbar spine, and chronic knee pain.  She says that she has been out of her BP meds for the past several days, namely clonidine.  She has an anxiety disorder and is followed by mental health specialists.     Past Medical History  Diagnosis Date  . Hypertension   . Bipolar 1 disorder     Past Surgical History  Procedure Date  . Shoulder arthroscopy   . Knee arthroscopy   . Kidney stone surgery     Family History  Problem Relation Age of Onset  . Addison's disease Mother   . Diabetes Mother   . Bipolar disorder Mother     History  Substance Use Topics  . Smoking status: Never Smoker   . Smokeless tobacco: Not on file  . Alcohol Use: No    OB History    Grav Para Term Preterm Abortions TAB SAB Ect Mult Living                  Review of Systems  Constitutional: Negative.   HENT: Negative.   Eyes: Negative.   Respiratory: Negative.   Cardiovascular: Negative.   Gastrointestinal: Negative.   Musculoskeletal: Positive for back pain and arthralgias.  Neurological: Negative.   Hematological: Negative.   Psychiatric/Behavioral: Positive for sleep disturbance and agitation.    Allergies  Clindamycin/lincomycin and Nubain  Home Medications   Current Outpatient Rx  Name  Route  Sig  Dispense  Refill  . CLONAZEPAM 1 MG PO TABS   Oral   Take 1 mg by mouth 2 (two) times daily as needed.         Marland Kitchen CLONIDINE HCL 0.1 MG PO TABS   Oral   Take 0.2 mg by mouth 2 (two) times daily. RAN OUT         . CYCLOBENZAPRINE HCL 10 MG PO TABS   Oral   Take 10 mg by mouth 2 (two) times daily as needed.         Marland Kitchen DICLOFENAC POTASSIUM 50 MG PO TABS   Oral   Take 1 tablet (50 mg total) by mouth 3 (three) times daily. As needed for pain   30 tablet   0   . DICLOFENAC  SODIUM 1 % TD GEL   Topical   Apply 2 g topically 4 (four) times daily.   2 Tube   2   . GABAPENTIN 100 MG PO CAPS   Oral   Take 100 mg by mouth 3 (three) times daily.         Marland Kitchen LAMOTRIGINE 200 MG PO TABS   Oral   Take 150 mg by mouth 2 (two) times daily.          Marland Kitchen LITHIUM CARBONATE 600 MG PO CAPS   Oral   Take 300 mg by mouth 2 (two) times daily.          Marland Kitchen NORGESTREL-ETHINYL ESTRADIOL 0.3-30 MG-MCG PO TABS   Oral   Take 1 tablet by mouth daily.         . QUETIAPINE FUMARATE 200 MG PO TABS   Oral   Take 200 mg by mouth at bedtime.         . TRAMADOL HCL 50  MG PO TABS   Oral   Take 50 mg by mouth every 6 (six) hours as needed.           There were no vitals taken for this visit.  Physical Exam  Constitutional: She is oriented to person, place, and time. She appears well-developed and well-nourished. No distress.  HENT:  Head: Normocephalic and atraumatic.  Eyes: Conjunctivae normal are normal. Pupils are equal, round, and reactive to light.  Neck: Normal range of motion. Neck supple. No JVD present. No thyromegaly present.  Cardiovascular: Normal rate, regular rhythm and normal heart sounds.   Pulmonary/Chest: Effort normal and breath sounds normal.  Abdominal: Soft. Bowel sounds are normal.  Musculoskeletal:       Tight and tender paraspinal muscles bilateral    Neurological: She is alert and oriented to person, place, and time.  Skin: Skin is warm and dry.  Psychiatric: She has a normal mood and affect. Her behavior is normal. Judgment and thought content normal.    ED Course  Procedures (including critical care time)  Labs Reviewed - No data to display No results found.   No diagnosis found.    MDM  IMPRESSION  Hypertension  Bipolar disorder  Chronic back and knee pain  RECOMMENDATIONS / PLAN  Refilled medications for patient today.   Flu vaccine today    FOLLOW UP 3 months for regular medical check.  Pt says she is trying  to establish with [facility] and may follow up there if accepted.    The patient was given clear instructions to go to ER or return to medical center if symptoms don't improve, worsen or new problems develop.  The patient verbalized understanding.  The patient was told to call to get lab results if they haven't heard anything in the next week.           Cleora Fleet, MD 08/10/12 [phone]

## 2012-08-10 NOTE — ED Notes (Signed)
Angelique Blonder, lpn triaged patient.  Medication refill

## 2012-10-11 MED ORDER — TRAMADOL HCL 50 MG PO TABS: 50.0000 mg | ORAL_TABLET | Freq: Four times a day (QID) | ORAL | Status: DC | PRN

## 2012-10-11 MED ORDER — CLONIDINE HCL 0.2 MG PO TABS: 0.2000 mg | ORAL_TABLET | Freq: Two times a day (BID) | ORAL | Status: DC

## 2012-10-11 MED ORDER — CYCLOBENZAPRINE HCL 10 MG PO TABS: 10.0000 mg | ORAL_TABLET | Freq: Two times a day (BID) | ORAL | Status: DC | PRN

## 2012-10-11 NOTE — Progress Notes (Addendum)
The patient came in requesting refills on couple of her medications.  She needed refills for Flexeril clonidine and tramadol.  We sent the prescriptions electronically to [facility] on [address].  The patient also made her followup appointment for late February 2014.  Patient says she's getting blood work done through her mental health specialist next week and will make sure the results get forwarded to our office.  Rodney Langton, MD, CDE, FAAFP [facility] [place], [place]

## 2012-12-21 ENCOUNTER — Emergency Department (HOSPITAL_COMMUNITY)
Admission: EM | Admit: 2012-12-21 | Discharge: 2012-12-21 | Disposition: A | Discharge status: Home / Self Care | Payer: BC Managed Care – PPO | Source: Home / Self Care

## 2012-12-21 ENCOUNTER — Encounter (HOSPITAL_COMMUNITY): Payer: Self-pay | Admitting: *Deleted

## 2012-12-21 DIAGNOSIS — G43909 Migraine, unspecified, not intractable, without status migrainosus: Secondary | ICD-10-CM

## 2012-12-21 MED ORDER — CYCLOBENZAPRINE HCL 10 MG PO TABS: 10.0000 mg | ORAL_TABLET | Freq: Two times a day (BID) | ORAL | Status: DC | PRN

## 2012-12-21 MED ORDER — ALMOTRIPTAN MALATE 6.25 MG PO TABS: 6.2500 mg | ORAL_TABLET | Freq: Two times a day (BID) | ORAL | Status: DC | PRN

## 2012-12-21 MED ORDER — TRAMADOL HCL 50 MG PO TABS: 50.0000 mg | ORAL_TABLET | Freq: Three times a day (TID) | ORAL | Status: DC | PRN

## 2012-12-21 MED ORDER — CLONIDINE HCL 0.2 MG PO TABS: 0.2000 mg | ORAL_TABLET | Freq: Two times a day (BID) | ORAL | Status: DC

## 2012-12-21 NOTE — ED Notes (Signed)
Patient presents for follow of hypertension medication also new back pain that feels like pins and needles. Also patient states she had a very bad headache this morning and took 0.4 Clonadine instead of prescribed 0.2 mg.

## 2012-12-21 NOTE — ED Notes (Signed)
Patient Demographics  Maria Powers, is a 37 y.o. female  WUJ:[id number]  [id number]  DOB - Feb 02, 1976  Chief Complaint  Patient presents with  . Follow-up        Subjective:   Maria Powers with history of hypertension and migraine comes in for an attack of migraine which has been going on for about 3 days now, she describes a intense headache which is throbbing in nature constant, around the right frontal area, causes mild nausea, she prefers to stay in a dark room as it relieves her headache, no exacerbating symptoms, denies any neck stiffness, denies any fever chills, denies any weakness tingling numbness in any extremity. She says she gets migraine attacks about once every 6 months and usually medication AXERT relieves it.  Objective:   Past Medical History  Diagnosis Date  . Hypertension   . Bipolar 1 disorder       Past Surgical History  Procedure Laterality Date  . Shoulder arthroscopy    . Knee arthroscopy    . Kidney stone surgery       Filed Vitals:   12/21/12 1539  BP: 122/82  Pulse: 85  Temp: 97.8 F (36.6 C)  TempSrc: Oral  Resp: 18  SpO2: 100%     Exam  Awake Alert, Oriented X 3, No new F.N deficits, Normal affect [place].AT,PERRAL Supple Neck,No JVD, No cervical lymphadenopathy appriciated.  Symmetrical Chest wall movement, Good air movement bilaterally, CTAB RRR,No Gallops,Rubs or new Murmurs, No Parasternal Heave +ve B.Sounds, Abd Soft, Non tender, No organomegaly appriciated, No rebound - guarding or rigidity. No Cyanosis, Clubbing or edema, No new Rash or bruise      Data Review   CBC No results found for this basename: WBC, HGB, HCT, PLT, MCV, MCH, MCHC, RDW, NEUTRABS, LYMPHSABS, MONOABS, EOSABS, BASOSABS, BANDABS, BANDSABD,  in the last 168 hours  Chemistries   No results found for this basename: NA, K, CL, CO2, GLUCOSE, BUN, CREATININE, GFRCGP, CALCIUM, MG, AST, ALT, ALKPHOS, BILITOT,  in the last 168  hours ------------------------------------------------------------------------------------------------------------------ No results found for this basename: HGBA1C,  in the last 72 hours ------------------------------------------------------------------------------------------------------------------ No results found for this basename: CHOL, HDL, LDLCALC, TRIG, CHOLHDL, LDLDIRECT,  in the last 72 hours ------------------------------------------------------------------------------------------------------------------ No results found for this basename: TSH, T4TOTAL, FREET3, T3FREE, THYROIDAB,  in the last 72 hours ------------------------------------------------------------------------------------------------------------------ No results found for this basename: VITAMINB12, FOLATE, FERRITIN, TIBC, IRON, RETICCTPCT,  in the last 72 hours  Coagulation profile  No results found for this basename: INR, PROTIME,  in the last 168 hours     Prior to Admission medications   Medication Sig Start Date End Date Taking? Authorizing Provider  almotriptan (AXERT) 6.25 MG tablet Take 1 tablet (6.25 mg total) by mouth 2 (two) times daily as needed for migraine. may repeat in 2 hours if needed 12/21/12   Leroy Sea, MD  clonazePAM (KLONOPIN) 1 MG tablet Take 1 mg by mouth 2 (two) times daily as needed.    Historical Provider, MD  cloNIDine (CATAPRES) 0.2 MG tablet Take 1 tablet (0.2 mg total) by mouth 2 (two) times daily. RAN OUT 12/21/12   Leroy Sea, MD  cyclobenzaprine (FLEXERIL) 10 MG tablet Take 1 tablet (10 mg total) by mouth 2 (two) times daily as needed for muscle spasms. 12/21/12   Leroy Sea, MD  gabapentin (NEURONTIN) 100 MG capsule Take 100 mg by mouth 3 (three) times daily.    Historical Provider, MD  lamoTRIgine (LAMICTAL) 200 MG tablet Take  150 mg by mouth 2 (two) times daily.     Historical Provider, MD  lithium 600 MG capsule Take 300 mg by mouth 2 (two) times daily.     Historical  Provider, MD  norgestrel-ethinyl estradiol (LO/OVRAL,CRYSELLE) 0.3-30 MG-MCG tablet Take 1 tablet by mouth daily.    Historical Provider, MD  QUEtiapine (SEROQUEL) 200 MG tablet Take 200 mg by mouth at bedtime.    Historical Provider, MD  traMADol (ULTRAM) 50 MG tablet Take 1 tablet (50 mg total) by mouth every 8 (eight) hours as needed for pain. 12/21/12   Leroy Sea, MD     Assessment & Plan   Migraine headaches.- Typically patient gets relief from Axert which was prescribed, also told to take over the counter Motrin up to 800 mg 3 times a day when necessary. Patient was advised that if headaches are not better in 24 hours she can go to the nearest ER.   Hypertension stable clonidine refilled   Chronic backache and muscle spasms. Flexeril along with tramadol refilled.   Depression anxiety stable will follow with her psychiatrist, denies any severe depression, not suicidal homicidal.    Follow-up Information   Follow up In 1 month. (As needed)        Leroy Sea M.D on 12/21/2012 at 3:55 PM   Leroy Sea, MD 12/21/12 [phone]

## 2013-01-20 ENCOUNTER — Emergency Department (HOSPITAL_COMMUNITY)
Admission: EM | Admit: 2013-01-20 | Discharge: 2013-01-20 | Disposition: A | Discharge status: Home Health | Payer: BC Managed Care – PPO | Source: Home / Self Care

## 2013-01-20 ENCOUNTER — Encounter (HOSPITAL_COMMUNITY): Payer: Self-pay

## 2013-01-20 DIAGNOSIS — I10 Essential (primary) hypertension: Secondary | ICD-10-CM

## 2013-01-20 DIAGNOSIS — F319 Bipolar disorder, unspecified: Secondary | ICD-10-CM

## 2013-01-20 DIAGNOSIS — M545 Low back pain, unspecified: Secondary | ICD-10-CM

## 2013-01-20 DIAGNOSIS — G8929 Other chronic pain: Secondary | ICD-10-CM

## 2013-01-20 MED ORDER — TRAMADOL HCL 50 MG PO TABS: 50.0000 mg | ORAL_TABLET | Freq: Four times a day (QID) | ORAL | Status: DC | PRN

## 2013-01-20 NOTE — ED Provider Notes (Signed)
Patient Demographics  Maria Powers, is a 37 y.o. female  ZOX:[id number]  [id number]:[id number]  DOB - 1975-11-27  Chief Complaint  Patient presents with  . Follow-up        Subjective:   Maria Powers today is here for a follow up visit. Patient has a past medical history of hypertension, chronic back pain and bipolar disorder. Her mood is currently stable. She has no major complaints and wants a refill on her tramadol and the blood pressure medications.  Patient has No headache, No chest pain, No abdominal pain - No Nausea, No new weakness tingling or numbness, No Cough - SOB.  Objective:    Filed Vitals:   01/20/13 1516  BP: 141/97  Pulse: 98  Temp: 98.1 F (36.7 C)  TempSrc: Oral  Resp: 16  SpO2: 98%     ALLERGIES:   Allergies  Allergen Reactions  . Clindamycin/Lincomycin   . Nubain (Nalbuphine Hcl)     PAST MEDICAL HISTORY: Past Medical History  Diagnosis Date  . Hypertension   . Bipolar 1 disorder     MEDICATIONS AT HOME: Prior to Admission medications   Medication Sig Start Date End Date Taking? Authorizing Provider  almotriptan (AXERT) 6.25 MG tablet Take 1 tablet (6.25 mg total) by mouth 2 (two) times daily as needed for migraine. may repeat in 2 hours if needed 12/21/12   Leroy Sea, MD  clonazePAM (KLONOPIN) 1 MG tablet Take 1 mg by mouth 2 (two) times daily as needed.    Historical Provider, MD  cloNIDine (CATAPRES) 0.2 MG tablet Take 1 tablet (0.2 mg total) by mouth 2 (two) times daily. RAN OUT 12/21/12   Leroy Sea, MD  cyclobenzaprine (FLEXERIL) 10 MG tablet Take 1 tablet (10 mg total) by mouth 2 (two) times daily as needed for muscle spasms. 12/21/12   Leroy Sea, MD  gabapentin (NEURONTIN) 100 MG capsule Take 100 mg by mouth 3 (three) times daily.    Historical Provider, MD  lamoTRIgine (LAMICTAL) 200 MG tablet Take 150 mg by mouth 2 (two) times daily.     Historical Provider, MD  lithium 600 MG capsule Take 300 mg by mouth 2 (two)  times daily.     Historical Provider, MD  norgestrel-ethinyl estradiol (LO/OVRAL,CRYSELLE) 0.3-30 MG-MCG tablet Take 1 tablet by mouth daily.    Historical Provider, MD  QUEtiapine (SEROQUEL) 200 MG tablet Take 200 mg by mouth at bedtime.    Historical Provider, MD  traMADol (ULTRAM) 50 MG tablet Take 1 tablet (50 mg total) by mouth every 6 (six) hours as needed for pain. 01/20/13   Marcianna Daily Levora Dredge, MD     Exam  General appearance :Awake, alert, not in any distress. Speech Clear. Not toxic Looking HEENT: Atraumatic and Normocephalic, pupils equally reactive to light and accomodation Neck: supple, no JVD. No cervical lymphadenopathy.  Chest:Good air entry bilaterally, no added sounds  CVS: S1 S2 regular, no murmurs.  Abdomen: Bowel sounds present, Non tender and not distended with no gaurding, rigidity or rebound. Extremities: B/L Lower Ext shows no edema, both legs are warm to touch Neurology: Awake alert, and oriented X 3, CN II-XII intact, Non focal Skin:No Rash Wounds:N/A    Data Review   CBC No results found for this basename: WBC, HGB, HCT, PLT, MCV, MCH, MCHC, RDW, NEUTRABS, LYMPHSABS, MONOABS, EOSABS, BASOSABS, BANDABS, BANDSABD,  in the last 168 hours  Chemistries   No results found for this basename: NA, K, CL, CO2, GLUCOSE, BUN, CREATININE,  GFRCGP, CALCIUM, MG, AST, ALT, ALKPHOS, BILITOT,  in the last 168 hours ------------------------------------------------------------------------------------------------------------------ No results found for this basename: HGBA1C,  in the last 72 hours ------------------------------------------------------------------------------------------------------------------ No results found for this basename: CHOL, HDL, LDLCALC, TRIG, CHOLHDL, LDLDIRECT,  in the last 72 hours ------------------------------------------------------------------------------------------------------------------ No results found for this basename: TSH, T4TOTAL,  FREET3, T3FREE, THYROIDAB,  in the last 72 hours ------------------------------------------------------------------------------------------------------------------ No results found for this basename: VITAMINB12, FOLATE, FERRITIN, TIBC, IRON, RETICCTPCT,  in the last 72 hours  Coagulation profile  No results found for this basename: INR, PROTIME,  in the last 168 hours    Assessment & Plan   Hypertension - Stable - Continue with clonidine  Chronic pain syndrome - Refill tramadol-90 tablets  Bipolar 1 disorder - Stable-this is being managed by her primary psychiatrist  Migraine headaches - currently headache free -on prn almotriptan   Follow up in 6 weeks-have ordered for labs prior to next visit-please follow next visit.  Follow-up Information   Schedule an appointment as soon as possible for a visit in 6 weeks to follow up.      Maretta Bees, MD 01/20/13 1623

## 2013-01-20 NOTE — ED Notes (Signed)
Follow up/ migraines

## 2013-02-27 ENCOUNTER — Ambulatory Visit: Payer: BC Managed Care – PPO | Attending: Family Medicine | Admitting: Internal Medicine

## 2013-02-27 VITALS — BP 155/108 | HR 99 | Temp 98.4°F | Resp 18 | Ht 64.5 in | Wt 245.0 lb

## 2013-02-27 DIAGNOSIS — I10 Essential (primary) hypertension: Secondary | ICD-10-CM

## 2013-02-27 MED ORDER — CYCLOBENZAPRINE HCL 10 MG PO TABS: 10.0000 mg | ORAL_TABLET | Freq: Three times a day (TID) | ORAL | Status: DC | PRN

## 2013-02-27 MED ORDER — CLONIDINE HCL 0.2 MG PO TABS: 0.2000 mg | ORAL_TABLET | Freq: Two times a day (BID) | ORAL | Status: DC

## 2013-02-27 MED ORDER — TRAMADOL HCL 50 MG PO TABS: 50.0000 mg | ORAL_TABLET | Freq: Four times a day (QID) | ORAL | Status: DC | PRN

## 2013-02-27 MED ORDER — ALMOTRIPTAN MALATE 6.25 MG PO TABS: 6.2500 mg | ORAL_TABLET | Freq: Two times a day (BID) | ORAL | Status: DC | PRN

## 2013-02-27 NOTE — Progress Notes (Signed)
Patient states she needs refills on BP rx and states that she has been trying to refill authorization from doctors in this office for her Axert.

## 2013-02-27 NOTE — Progress Notes (Signed)
Patient ID: Maria Powers, female   DOB: [birth year], 37 y.o.   MRN: [id number]  CC: Medication refill  HPI: Patient is 37 year old female who presents to clinic requiring refill on her medications for blood pressure. She denies chest pain or shortness of breath, no abdominal or urinary concerns. She explains she has been checking her blood pressure regularly and the numbers are usually less than 130/90. She denies recent sicknesses or hospitalizations, denies drug or alcohol use.  Allergies  Allergen Reactions  . Clindamycin/Lincomycin   . Nubain (Nalbuphine Hcl)    Past Medical History  Diagnosis Date  . Hypertension   . Bipolar 1 disorder    Current Outpatient Prescriptions on File Prior to Visit  Medication Sig Dispense Refill  . clonazePAM (KLONOPIN) 1 MG tablet Take 1 mg by mouth 2 (two) times daily as needed.      . gabapentin (NEURONTIN) 100 MG capsule Take 100 mg by mouth 3 (three) times daily.      Marland Kitchen lamoTRIgine (LAMICTAL) 200 MG tablet Take 150 mg by mouth 2 (two) times daily.       Marland Kitchen lithium 600 MG capsule Take 300 mg by mouth 2 (two) times daily.       . QUEtiapine (SEROQUEL) 200 MG tablet Take 200 mg by mouth at bedtime.      . norgestrel-ethinyl estradiol (LO/OVRAL,CRYSELLE) 0.3-30 MG-MCG tablet Take 1 tablet by mouth daily.       No current facility-administered medications on file prior to visit.   Family History  Problem Relation Age of Onset  . Addison's disease Mother   . Diabetes Mother   . Bipolar disorder Mother    History   Social History  . Marital Status: Single    Spouse Name: N/A    Number of Children: N/A  . Years of Education: N/A   Occupational History  . Not on file.   Social History Main Topics  . Smoking status: Never Smoker   . Smokeless tobacco: Not on file  . Alcohol Use: No  . Drug Use: No  . Sexually Active:    Other Topics Concern  . Not on file   Social History Narrative  . No narrative on file    Review of Systems   Constitutional: Negative for fever, chills, diaphoresis, activity change, appetite change and fatigue.  HENT: Negative for ear pain, nosebleeds, congestion, facial swelling, rhinorrhea, neck pain, neck stiffness and ear discharge.   Eyes: Negative for pain, discharge, redness, itching and visual disturbance.  Respiratory: Negative for cough, choking, chest tightness, shortness of breath, wheezing and stridor.   Cardiovascular: Negative for chest pain, palpitations and leg swelling.  Gastrointestinal: Negative for abdominal distention.  Genitourinary: Negative for dysuria, urgency, frequency, hematuria, flank pain, decreased urine volume, difficulty urinating and dyspareunia.  Musculoskeletal: Negative for back pain, joint swelling, arthralgias and gait problem.  Neurological: Negative for dizziness, tremors, seizures, syncope, facial asymmetry, speech difficulty, weakness, light-headedness, numbness and headaches.  Hematological: Negative for adenopathy. Does not bruise/bleed easily.  Psychiatric/Behavioral: Negative for hallucinations, behavioral problems, confusion, dysphoric mood, decreased concentration and agitation.    Objective:   Filed Vitals:   02/27/13 1550  BP: 155/108  Pulse: 99  Temp: 98.4 F (36.9 C)  Resp: 18    Physical Exam  Constitutional: Appears well-developed and well-nourished. No distress.  CVS: RRR, S1/S2 +, no murmurs, no gallops, no carotid bruit.  Pulmonary: Effort and breath sounds normal, no stridor, rhonchi, wheezes, rales.  Abdominal: Soft. BS +,  no distension, tenderness, rebound or guarding.  Neuro: Alert. Normal reflexes, muscle tone coordination. No cranial nerve deficit.   Lab Results  Component Value Date   WBC 12.5* 11/10/2010   HGB 13.4 11/10/2010   HCT 40.3 11/10/2010   MCV 84.3 11/10/2010   PLT 246 11/10/2010   Lab Results  Component Value Date   CREATININE 0.66 11/10/2010   BUN 8 11/10/2010   NA 140 11/10/2010   K 3.5 11/10/2010   CL  106 11/10/2010   CO2 25 11/10/2010    No results found for this basename: HGBA1C   Lipid Panel  No results found for this basename: chol, trig, hdl, cholhdl, vldl, ldlcalc       Assessment and plan:   Patient Active Problem List   Diagnosis Date Noted  .  hypertension  - slightly above target range today, we have discussed importance of compliance with medications, I have also advised patient to check her blood pressure regularly and to call me back if the numbers are persistently higher to 140/90. We'll provide prescription for clonidine 0.2 mg tablet to take twice daily by mouth. We may need to add additional antihypertensive regimen if blood pressure remains persistently uncontrolled. We have discussed obtaining electrolyte panel and patient explains that she has already had that done and will bring [place] the results to the clinic.  05/18/2012

## 2013-02-27 NOTE — Patient Instructions (Signed)
Migraine Headache A migraine headache is an intense, throbbing pain on one or both sides of your head. A migraine can last for 30 minutes to several hours. CAUSES  The exact cause of a migraine headache is not always known. However, a migraine may be caused when nerves in the brain become irritated and release chemicals that cause inflammation. This causes pain. SYMPTOMS  Pain on one or both sides of your head.  Pulsating or throbbing pain.  Severe pain that prevents daily activities.  Pain that is aggravated by any physical activity.  Nausea, vomiting, or both.  Dizziness.  Pain with exposure to bright lights, loud noises, or activity.  General sensitivity to bright lights, loud noises, or smells. Before you get a migraine, you may get warning signs that a migraine is coming (aura). An aura may include:  Seeing flashing lights.  Seeing bright spots, halos, or zig-zag lines.  Having tunnel vision or blurred vision.  Having feelings of numbness or tingling.  Having trouble talking.  Having muscle weakness. MIGRAINE TRIGGERS  Alcohol.  Smoking.  Stress.  Menstruation.  Aged cheeses.  Foods or drinks that contain nitrates, glutamate, aspartame, or tyramine.  Lack of sleep.  Chocolate.  Caffeine.  Hunger.  Physical exertion.  Fatigue.  Medicines used to treat chest pain (nitroglycerine), birth control pills, estrogen, and some blood pressure medicines. DIAGNOSIS  A migraine headache is often diagnosed based on:  Symptoms.  Physical examination.  A CT scan or MRI of your head. TREATMENT Medicines may be given for pain and nausea. Medicines can also be given to help prevent recurrent migraines.  HOME CARE INSTRUCTIONS  Only take over-the-counter or prescription medicines for pain or discomfort as directed by your caregiver. The use of long-term narcotics is not recommended.  Lie down in a dark, quiet room when you have a migraine.  Keep a journal  to find out what may trigger your migraine headaches. For example, write down:  What you eat and drink.  How much sleep you get.  Any change to your diet or medicines.  Limit alcohol consumption.  Quit smoking if you smoke.  Get 7 to 9 hours of sleep, or as recommended by your caregiver.  Limit stress.  Keep lights dim if bright lights bother you and make your migraines worse. SEEK IMMEDIATE MEDICAL CARE IF:   Your migraine becomes severe.  You have a fever.  You have a stiff neck.  You have vision loss.  You have muscular weakness or loss of muscle control.  You start losing your balance or have trouble walking.  You feel faint or pass out.  You have severe symptoms that are different from your first symptoms. MAKE SURE YOU:   Understand these instructions.  Will watch your condition.  Will get help right away if you are not doing well or get worse. Document Released: 09/07/2005 Document Revised: 11/30/2011 Document Reviewed: 08/28/2011 [employer] Patient Information 2014 [employer].  

## 2013-05-15 ENCOUNTER — Ambulatory Visit: Payer: BC Managed Care – PPO

## 2013-07-10 ENCOUNTER — Ambulatory Visit: Payer: BC Managed Care – PPO | Attending: Internal Medicine | Admitting: Internal Medicine

## 2013-07-10 ENCOUNTER — Encounter: Payer: Self-pay | Admitting: Internal Medicine

## 2013-07-10 VITALS — BP 167/106 | HR 102 | Temp 97.9°F | Resp 16 | Ht 65.0 in | Wt 240.0 lb

## 2013-07-10 DIAGNOSIS — G43909 Migraine, unspecified, not intractable, without status migrainosus: Secondary | ICD-10-CM | POA: Insufficient documentation

## 2013-07-10 DIAGNOSIS — I10 Essential (primary) hypertension: Secondary | ICD-10-CM

## 2013-07-10 DIAGNOSIS — Z23 Encounter for immunization: Secondary | ICD-10-CM

## 2013-07-10 DIAGNOSIS — H9209 Otalgia, unspecified ear: Secondary | ICD-10-CM | POA: Insufficient documentation

## 2013-07-10 DIAGNOSIS — H9202 Otalgia, left ear: Secondary | ICD-10-CM

## 2013-07-10 MED ORDER — CLONIDINE HCL 0.1 MG PO TABS
0.1000 mg | ORAL_TABLET | Freq: Once | ORAL | Status: AC
  Administered 2013-07-10: 0.1 mg via ORAL

## 2013-07-10 MED ORDER — CLONIDINE HCL 0.2 MG PO TABS: 0.2000 mg | ORAL_TABLET | Freq: Two times a day (BID) | ORAL | Status: DC

## 2013-07-10 MED ORDER — CYCLOBENZAPRINE HCL 10 MG PO TABS: 10.0000 mg | ORAL_TABLET | Freq: Three times a day (TID) | ORAL | Status: DC | PRN

## 2013-07-10 MED ORDER — TRAMADOL HCL 50 MG PO TABS: 50.0000 mg | ORAL_TABLET | Freq: Four times a day (QID) | ORAL | Status: DC | PRN

## 2013-07-10 NOTE — Patient Instructions (Signed)

## 2013-07-10 NOTE — Progress Notes (Signed)
Pt is here for a f/u visit. Pt needs a medication refill. Pt is requesting a flu shot and have her ear checked out. Pt reports having migraines and not being able to get medications for that due to prior authorization.

## 2013-07-10 NOTE — Progress Notes (Signed)
Patient ID: Maria Powers, female   DOB: Aug 11, 1976, 37 y.o.   MRN: [id number] Patient Demographics  Maria Powers, is a 37 y.o. female  WUJ:[id number]  [id number]  DOB - 1976-08-27  Chief Complaint  Patient presents with  . Follow-up        Subjective:   Maria Powers is a 37 y.o. female here today for a follow up visit. Patient wants to get flu shot, she also noted a refill on 2 of her medications including tramadol. She has no major complaints today except that she has not gotten her medications for migraine due to insurance prior authorization daily. She is on clonidine for hypertension but she doesn't take it as she should.  Patient has No headache, No chest pain, No abdominal pain - No Nausea, No new weakness tingling or numbness, No Cough - SOB.  ALLERGIES: Allergies  Allergen Reactions  . Clindamycin/Lincomycin   . Nubain [Nalbuphine Hcl]     PAST MEDICAL HISTORY: Past Medical History  Diagnosis Date  . Hypertension   . Bipolar 1 disorder     MEDICATIONS AT HOME: Prior to Admission medications   Medication Sig Start Date End Date Taking? Authorizing Provider  clonazePAM (KLONOPIN) 1 MG tablet Take 1 mg by mouth 2 (two) times daily as needed.   Yes Historical Provider, MD  cloNIDine (CATAPRES) 0.2 MG tablet Take 1 tablet (0.2 mg total) by mouth 2 (two) times daily. RAN OUT 07/10/13  Yes Jeanann Lewandowsky, MD  cyclobenzaprine (FLEXERIL) 10 MG tablet Take 1 tablet (10 mg total) by mouth 3 (three) times daily as needed for muscle spasms. 07/10/13  Yes Jeanann Lewandowsky, MD  gabapentin (NEURONTIN) 100 MG capsule Take 100 mg by mouth 3 (three) times daily.   Yes Historical Provider, MD  lamoTRIgine (LAMICTAL) 200 MG tablet Take 150 mg by mouth 2 (two) times daily.    Yes Historical Provider, MD  lithium 600 MG capsule Take 300 mg by mouth 2 (two) times daily.    Yes Historical Provider, MD  norgestrel-ethinyl estradiol (LO/OVRAL,CRYSELLE) 0.3-30 MG-MCG tablet Take  1 tablet by mouth daily.   Yes Historical Provider, MD  QUEtiapine (SEROQUEL) 200 MG tablet Take 200 mg by mouth at bedtime.   Yes Historical Provider, MD  traMADol (ULTRAM) 50 MG tablet Take 1 tablet (50 mg total) by mouth every 6 (six) hours as needed for pain. 07/10/13  Yes Jeanann Lewandowsky, MD  almotriptan (AXERT) 6.25 MG tablet Take 1 tablet (6.25 mg total) by mouth 2 (two) times daily as needed for migraine. may repeat in 2 hours if needed 02/27/13   Dorothea Ogle, MD     Objective:   Filed Vitals:   07/10/13 1805  BP: 167/106  Pulse: 102  Temp: 97.9 F (36.6 C)  TempSrc: Oral  Resp: 16  Height: 5\' 5"  (1.651 m)  Weight: 240 lb (108.863 kg)  SpO2: 99%    Exam General appearance : Awake, alert, not in any distress. Speech Clear. Not toxic looking HEENT: Atraumatic and Normocephalic, pupils equally reactive to light and accomodation Neck: supple, no JVD. No cervical lymphadenopathy.  Chest:Good air entry bilaterally, no added sounds  CVS: S1 S2 regular, no murmurs.  Abdomen: Bowel sounds present, Non tender and not distended with no gaurding, rigidity or rebound. Extremities: B/L Lower Ext shows no edema, both legs are warm to touch Neurology: Awake alert, and oriented X 3, CN II-XII intact, Non focal Skin:No Rash Wounds:N/A   Data Review   CBC No results found  for this basename: WBC, HGB, HCT, PLT, MCV, MCH, MCHC, RDW, NEUTRABS, LYMPHSABS, MONOABS, EOSABS, BASOSABS, BANDABS, BANDSABD,  in the last 168 hours  Chemistries   No results found for this basename: NA, K, CL, CO2, GLUCOSE, BUN, CREATININE, GFRCGP, CALCIUM, MG, AST, ALT, ALKPHOS, BILITOT,  in the last 168 hours ------------------------------------------------------------------------------------------------------------------ No results found for this basename: HGBA1C,  in the last 72 hours ------------------------------------------------------------------------------------------------------------------ No  results found for this basename: CHOL, HDL, LDLCALC, TRIG, CHOLHDL, LDLDIRECT,  in the last 72 hours ------------------------------------------------------------------------------------------------------------------ No results found for this basename: TSH, T4TOTAL, FREET3, T3FREE, THYROIDAB,  in the last 72 hours ------------------------------------------------------------------------------------------------------------------ No results found for this basename: VITAMINB12, FOLATE, FERRITIN, TIBC, IRON, RETICCTPCT,  in the last 72 hours  Coagulation profile  No results found for this basename: INR, PROTIME,  in the last 168 hours    Assessment & Plan   Patient Active Problem List   Diagnosis Date Noted  . Ear pain 07/10/2013  . Migraine headache 07/10/2013  . Accelerated hypertension 02/27/2013  . Low back pain radiating to right leg 05/18/2012     Plan: Clonidine 0.1 mg tablet by mouth now for accelerated hypertension  Refill medications  Patient encouraged to be compliant with medications, blood pressure goals were discussed and the complications of uncontrolled hypertension discussed. I educated patient about uncontrolled hypertension complicating migraine headache, make her headache worse.  Health Maintenance -Vaccinations:  -Influenza given today  Follow up in 3 months or when necessary   The patient was given clear instructions to go to ER or return to medical center if symptoms don't improve, worsen or new problems develop. The patient verbalized understanding. The patient was told to call to get lab results if they haven't heard anything in the next week.    Jeanann Lewandowsky, MD, MHA, FACP, FAAP [facility] [place], [place] [phone]   07/10/2013, 6:23 PM

## 2013-08-08 ENCOUNTER — Other Ambulatory Visit: Payer: Self-pay | Admitting: Internal Medicine

## 2013-08-22 ENCOUNTER — Telehealth: Payer: Self-pay | Admitting: Internal Medicine

## 2013-08-22 NOTE — Telephone Encounter (Signed)
Pt calling because she is running out of cyclobenzaprine (FLEXERIL) 10 MG tablet prescribed on 08/08/13. Pt says has been taking her med 3x per day as was prescribed in the past by a different  physician and had not noticed that script was changed 2 times daily until her pharmacist pointed it out to her. Please f/u with pt.

## 2013-08-24 MED ORDER — CYCLOBENZAPRINE HCL 10 MG PO TABS: 10.0000 mg | ORAL_TABLET | Freq: Three times a day (TID) | ORAL | Status: DC

## 2013-08-24 NOTE — Telephone Encounter (Signed)
New script for flexeril for three times a days  Sent to the [facility] on [address]

## 2013-09-25 ENCOUNTER — Other Ambulatory Visit: Payer: Self-pay | Admitting: Internal Medicine

## 2013-09-25 MED ORDER — CYCLOBENZAPRINE HCL 10 MG PO TABS: 10.0000 mg | ORAL_TABLET | Freq: Three times a day (TID) | ORAL | Status: DC

## 2013-10-10 ENCOUNTER — Ambulatory Visit: Payer: BC Managed Care – PPO

## 2013-10-18 ENCOUNTER — Other Ambulatory Visit: Payer: Self-pay | Admitting: Internal Medicine

## 2013-10-23 ENCOUNTER — Telehealth: Payer: Self-pay | Admitting: Emergency Medicine

## 2013-10-23 ENCOUNTER — Telehealth: Payer: Self-pay | Admitting: Internal Medicine

## 2013-10-23 ENCOUNTER — Other Ambulatory Visit: Payer: Self-pay | Admitting: Internal Medicine

## 2013-10-23 NOTE — Telephone Encounter (Signed)
Left message for pt to call clinic regarding script reorder. [facility]

## 2013-10-23 NOTE — Telephone Encounter (Signed)
Pt is calling in today to see if she can get a refill on script cyclobenzaprine (FLEXERIL) 10 MG tablet; Pt is completely out of medication, please f/u

## 2013-10-30 ENCOUNTER — Other Ambulatory Visit: Payer: Self-pay | Admitting: Internal Medicine

## 2013-10-31 ENCOUNTER — Telehealth: Payer: Self-pay

## 2013-10-31 NOTE — Telephone Encounter (Signed)
Patient requesting refill on tramadol Will need hard script

## 2013-11-05 ENCOUNTER — Encounter: Payer: Self-pay | Admitting: Internal Medicine

## 2013-11-08 ENCOUNTER — Telehealth: Payer: Self-pay

## 2013-11-08 NOTE — Telephone Encounter (Signed)
This patient would like a refill on her tramadol

## 2013-11-30 ENCOUNTER — Ambulatory Visit: Payer: BC Managed Care – PPO | Attending: Internal Medicine | Admitting: Internal Medicine

## 2013-11-30 ENCOUNTER — Encounter: Payer: Self-pay | Admitting: Internal Medicine

## 2013-11-30 VITALS — BP 153/97 | HR 93 | Temp 98.7°F | Resp 18 | Ht 65.0 in | Wt 255.0 lb

## 2013-11-30 DIAGNOSIS — I10 Essential (primary) hypertension: Secondary | ICD-10-CM

## 2013-11-30 DIAGNOSIS — J329 Chronic sinusitis, unspecified: Secondary | ICD-10-CM | POA: Insufficient documentation

## 2013-11-30 DIAGNOSIS — H9209 Otalgia, unspecified ear: Secondary | ICD-10-CM | POA: Insufficient documentation

## 2013-11-30 DIAGNOSIS — F319 Bipolar disorder, unspecified: Secondary | ICD-10-CM | POA: Insufficient documentation

## 2013-11-30 DIAGNOSIS — G43909 Migraine, unspecified, not intractable, without status migrainosus: Secondary | ICD-10-CM

## 2013-11-30 DIAGNOSIS — Z09 Encounter for follow-up examination after completed treatment for conditions other than malignant neoplasm: Secondary | ICD-10-CM | POA: Insufficient documentation

## 2013-11-30 MED ORDER — CLONAZEPAM 1 MG PO TABS: 1.0000 mg | ORAL_TABLET | Freq: Two times a day (BID) | ORAL | Status: DC | PRN

## 2013-11-30 MED ORDER — AMOXICILLIN 500 MG PO TABS: 500.0000 mg | ORAL_TABLET | Freq: Two times a day (BID) | ORAL | Status: DC

## 2013-11-30 MED ORDER — CLONIDINE HCL 0.2 MG PO TABS: 0.2000 mg | ORAL_TABLET | Freq: Two times a day (BID) | ORAL | Status: DC

## 2013-11-30 MED ORDER — CYCLOBENZAPRINE HCL 10 MG PO TABS: 10.0000 mg | ORAL_TABLET | Freq: Three times a day (TID) | ORAL | Status: DC

## 2013-11-30 MED ORDER — TRAMADOL HCL 50 MG PO TABS: 50.0000 mg | ORAL_TABLET | Freq: Four times a day (QID) | ORAL | Status: DC | PRN

## 2013-11-30 NOTE — Progress Notes (Signed)
Pt here for medication refills on medications and pain meds Denies cp or sob BP- 153/87 93

## 2013-11-30 NOTE — Progress Notes (Signed)
Patient ID: Maria Powers, female   DOB: 22-Oct-1975, 38 y.o.   MRN: [id number]   CC: Followup  HPI: Patient is 38 year old female who presents to clinic for regular followup and requires refills on medications. She also reports several days duration of left ear pain associated with yellowish drainage, pain is throbbing and intermittent, 5/10 in severity, nonradiating, no specific alleviating or ironing factors, no similar events in the past. She denies fevers and chills, no headaches, no visual changes. She reports history of chronic sinusitis and has been having nasal congestion worse over the past month. No known sick contacts or exposures. No recent sicknesses or hospitalizations.  Allergies  Allergen Reactions  . Clindamycin/Lincomycin   . Nubain [Nalbuphine Hcl]    Past Medical History  Diagnosis Date  . Hypertension   . Bipolar 1 disorder    Current Outpatient Prescriptions on File Prior to Visit  Medication Sig Dispense Refill  . gabapentin (NEURONTIN) 100 MG capsule Take 100 mg by mouth 3 (three) times daily.      Marland Kitchen lamoTRIgine (LAMICTAL) 200 MG tablet Take 150 mg by mouth 2 (two) times daily.       Marland Kitchen lithium 600 MG capsule Take 450 mg by mouth 2 (two) times daily with a meal.       . norgestrel-ethinyl estradiol (LO/OVRAL,CRYSELLE) 0.3-30 MG-MCG tablet Take 1 tablet by mouth daily.      Marland Kitchen almotriptan (AXERT) 6.25 MG tablet Take 1 tablet (6.25 mg total) by mouth 2 (two) times daily as needed for migraine. may repeat in 2 hours if needed  20 tablet  0  . QUEtiapine (SEROQUEL) 200 MG tablet Take 200 mg by mouth at bedtime.       No current facility-administered medications on file prior to visit.   Family History  Problem Relation Age of Onset  . Addison's disease Mother   . Diabetes Mother   . Bipolar disorder Mother    History   Social History  . Marital Status: Single    Spouse Name: N/A    Number of Children: N/A  . Years of Education: N/A   Occupational History   . Not on file.   Social History Main Topics  . Smoking status: Never Smoker   . Smokeless tobacco: Not on file  . Alcohol Use: No  . Drug Use: No  . Sexual Activity:    Other Topics Concern  . Not on file   Social History Narrative  . No narrative on file    Review of Systems  Constitutional: Negative for fever, chills, diaphoresis, activity change, appetite change and fatigue.  HENT: Per history of present illness.   Eyes: Negative for pain, discharge, redness, itching and visual disturbance.  Respiratory: Negative for cough, choking, chest tightness, shortness of breath, wheezing and stridor.   Cardiovascular: Negative for chest pain, palpitations and leg swelling.  Gastrointestinal: Negative for abdominal distention.  Genitourinary: Negative for dysuria, urgency, frequency, hematuria, flank pain, decreased urine volume, difficulty urinating and dyspareunia.  Musculoskeletal: Negative for back pain, joint swelling, arthralgias and gait problem.  Neurological: Negative for dizziness, tremors, seizures, syncope, facial asymmetry, speech difficulty, weakness, light-headedness, numbness and headaches.  Hematological: Negative for adenopathy. Does not bruise/bleed easily.  Psychiatric/Behavioral: Negative for hallucinations, behavioral problems, confusion, dysphoric mood, decreased concentration and agitation.    Objective:   Filed Vitals:   11/30/13 1454  BP: 153/97  Pulse: 93  Temp: 98.7 F (37.1 C)  Resp: 18    Physical Exam  Constitutional:  Appears well-developed and well-nourished. No distress.  HENT: Normocephalic. External right and left ear normal. Oropharynx is clear and moist.  left ear tympanic membrane erythematous and very tender with examination, small amount of drainage noted on the left side Eyes: Conjunctivae and EOM are normal. PERRLA, no scleral icterus.  Neck: Normal ROM. Neck supple. No JVD. No tracheal deviation. No thyromegaly.  CVS: RRR, S1/S2 +,  no murmurs, no gallops, no carotid bruit.  Pulmonary: Effort and breath sounds normal, no stridor, rhonchi, wheezes, rales.  Abdominal: Soft. BS +,  no distension, tenderness, rebound or guarding.   Lab Results  Component Value Date   WBC 12.5* 11/10/2010   HGB 13.4 11/10/2010   HCT 40.3 11/10/2010   MCV 84.3 11/10/2010   PLT 246 11/10/2010   Lab Results  Component Value Date   CREATININE 0.66 11/10/2010   BUN 8 11/10/2010   NA 140 11/10/2010   K 3.5 11/10/2010   CL 106 11/10/2010   CO2 25 11/10/2010    No results found for this basename: HGBA1C   Lipid Panel  No results found for this basename: chol, trig, hdl, cholhdl, vldl, ldlcalc       Assessment and plan:   Patient Active Problem List   Diagnosis Date Noted  . Ear pain - symptoms and physical exam findings consistent with otitis media. Will place on amoxicillin therapy for 7 days. Patient advised to come back and see us if her symptoms do not improve or get worse.  07/10/2013

## 2014-01-01 ENCOUNTER — Ambulatory Visit (INDEPENDENT_AMBULATORY_CARE_PROVIDER_SITE_OTHER): Payer: BC Managed Care – PPO | Admitting: Family Medicine

## 2014-01-01 ENCOUNTER — Encounter: Payer: Self-pay | Admitting: Family Medicine

## 2014-01-01 VITALS — BP 120/84 | Temp 98.9°F | Ht 65.0 in | Wt 250.0 lb

## 2014-01-01 DIAGNOSIS — I10 Essential (primary) hypertension: Secondary | ICD-10-CM

## 2014-01-01 DIAGNOSIS — G43909 Migraine, unspecified, not intractable, without status migrainosus: Secondary | ICD-10-CM

## 2014-01-01 MED ORDER — RIZATRIPTAN BENZOATE 5 MG PO TBDP: 5.0000 mg | ORAL_TABLET | ORAL | Status: DC | PRN

## 2014-01-01 NOTE — Progress Notes (Signed)
Pre visit review using our clinic review tool, if applicable. No additional management support is needed unless otherwise documented below in the visit note. 

## 2014-01-01 NOTE — Patient Instructions (Signed)
Maxalt 5 mg.......Marland Kitchen. 1 immediately at the first sign of a migraine  Return sometime this summer for general physical examination

## 2014-01-01 NOTE — Progress Notes (Signed)
   Subjective:    Patient ID: Maria Powers, female    DOB: 06/22/1976, 38 y.o.   MRN: [id number]  HPI Maria Powers is a 38 year old single female G0 P0 nonsmoker who comes in to get established as a new patient  I explained to her that I would be retiring any year and would be happy to care for her until that time and then transfer her to another physician. However she wishes to get established in her physical with somebody's front of the we'll follow her long-term. I certainly understand that  She takes axert for migraine headaches. She's out of her medication  She goes to mental health and is on Klonopin 0.5 doses one in the morning 2 in the AmPm, Prozac 40 mg daily, Lamictal 300 mg daily, lithium 900 mg daily. She has bipolar 1 depression and is clinically stable  She's off BCPs for birth control she is using abstinence  She has a history of degenerative disc disease takes Flexeril and tramadol when necessary for back pain  She has a history of hypertension she takes clonidine 0.2 twice a day BP 120/84  Her last physical exam was 2013.  Now she has insurance.  She states she's had 2 surgeries to her right knee  And allergic to Cleocin and Nubain but it description of her reaction sounds more like side effects and true allergy   Review of Systems Review of systems otherwise negative    Objective:   Physical Exam  Well-developed and nourished female no acute distress vital signs stable she is afebrile she has some red bumps on her back in her abdomen she's concerned he might be bed bugs. The extremities are has checked there apartment and there are no bed but      Assessment & Plan:  Migraine headaches,,,,,,, switch to Maxalt  Eczema,,,,,,,,, OTC cortisone cream  Bipolar depression on medication clinically stable followed by mental health  Hypertension at goal continue current therapy  Hip and back pain etiology unknown previously treated with Flexeril and tramadol when  necessary  Overweight..... 250 pounds........... discussed diet exercise and weight loss,,

## 2014-01-02 ENCOUNTER — Telehealth: Payer: Self-pay | Admitting: Family Medicine

## 2014-01-02 NOTE — Telephone Encounter (Signed)
Relevant patient education mailed to patient.  

## 2014-03-06 ENCOUNTER — Telehealth: Payer: Self-pay | Admitting: Family Medicine

## 2014-03-06 NOTE — Telephone Encounter (Addendum)
Pt states she is on her monthy cycle. Pt has cpe scheduled for 6/18. Pt wants to know if its ok to keep that appt due to her cycle.pt decided to keep appt. No note is now needed.

## 2014-03-08 ENCOUNTER — Telehealth: Payer: Self-pay | Admitting: Obstetrics and Gynecology

## 2014-03-08 ENCOUNTER — Ambulatory Visit (INDEPENDENT_AMBULATORY_CARE_PROVIDER_SITE_OTHER): Payer: BC Managed Care – PPO | Admitting: Family Medicine

## 2014-03-08 ENCOUNTER — Encounter: Payer: Self-pay | Admitting: Family Medicine

## 2014-03-08 VITALS — BP 122/80 | HR 81 | Temp 98.9°F | Ht 65.0 in | Wt 244.0 lb

## 2014-03-08 DIAGNOSIS — F319 Bipolar disorder, unspecified: Secondary | ICD-10-CM

## 2014-03-08 DIAGNOSIS — M79604 Pain in right leg: Secondary | ICD-10-CM

## 2014-03-08 DIAGNOSIS — N946 Dysmenorrhea, unspecified: Secondary | ICD-10-CM

## 2014-03-08 DIAGNOSIS — M545 Low back pain, unspecified: Secondary | ICD-10-CM

## 2014-03-08 DIAGNOSIS — E669 Obesity, unspecified: Secondary | ICD-10-CM

## 2014-03-08 DIAGNOSIS — I10 Essential (primary) hypertension: Secondary | ICD-10-CM

## 2014-03-08 DIAGNOSIS — N920 Excessive and frequent menstruation with regular cycle: Secondary | ICD-10-CM

## 2014-03-08 LAB — CBC WITH DIFFERENTIAL/PLATELET
BASOS ABS: 0 10*3/uL (ref 0.0–0.1)
Basophils Relative: 0.6 % (ref 0.0–3.0)
EOS PCT: 5.9 % — AB (ref 0.0–5.0)
Eosinophils Absolute: 0.4 10*3/uL (ref 0.0–0.7)
HCT: 38.3 % (ref 36.0–46.0)
Hemoglobin: 12.5 g/dL (ref 12.0–15.0)
LYMPHS PCT: 21.3 % (ref 12.0–46.0)
Lymphs Abs: 1.4 10*3/uL (ref 0.7–4.0)
MCHC: 32.7 g/dL (ref 30.0–36.0)
MCV: 84.9 fl (ref 78.0–100.0)
MONOS PCT: 6.4 % (ref 3.0–12.0)
Monocytes Absolute: 0.4 10*3/uL (ref 0.1–1.0)
NEUTROS PCT: 65.8 % (ref 43.0–77.0)
Neutro Abs: 4.2 10*3/uL (ref 1.4–7.7)
PLATELETS: 278 10*3/uL (ref 150.0–400.0)
RBC: 4.51 Mil/uL (ref 3.87–5.11)
RDW: 13.5 % (ref 11.5–15.5)
WBC: 6.4 10*3/uL (ref 4.0–10.5)

## 2014-03-08 LAB — LIPID PANEL
CHOLESTEROL: 204 mg/dL — AB (ref 0–200)
HDL: 45.6 mg/dL (ref >39.00)
LDL Cholesterol: 137 mg/dL — ABNORMAL HIGH (ref 0–99)
NONHDL: 158.4
Total CHOL/HDL Ratio: 4
Triglycerides: 109 mg/dL (ref 0.0–149.0)
VLDL: 21.8 mg/dL (ref 0.0–40.0)

## 2014-03-08 LAB — BASIC METABOLIC PANEL
BUN: 9 mg/dL (ref 6–23)
CHLORIDE: 108 meq/L (ref 96–112)
CO2: 25 mEq/L (ref 19–32)
Calcium: 9.7 mg/dL (ref 8.4–10.5)
Creatinine, Ser: 0.8 mg/dL (ref 0.4–1.2)
GFR: 83.31 mL/min (ref >60.00)
Glucose, Bld: 101 mg/dL — ABNORMAL HIGH (ref 70–99)
POTASSIUM: 3.9 meq/L (ref 3.5–5.1)
Sodium: 139 mEq/L (ref 135–145)

## 2014-03-08 LAB — HEMOGLOBIN A1C: HEMOGLOBIN A1C: 5.7 % (ref 4.6–6.5)

## 2014-03-08 LAB — TSH: TSH: 0.67 u[IU]/mL (ref 0.35–4.50)

## 2014-03-08 LAB — T4, FREE: FREE T4: 0.9 ng/dL (ref 0.60–1.60)

## 2014-03-08 NOTE — Progress Notes (Signed)
Pre visit review using our clinic review tool, if applicable. No additional management support is needed unless otherwise documented below in the visit note. 

## 2014-03-08 NOTE — Progress Notes (Signed)
No chief complaint on file.   HPI:  Maria Powers is here to establish care.  Last PCP and physical: can't remember last physical - thinks last pap in 2013 and normal. Can't remember last physical labs.  Has the following chronic problems and concerns today:  Dysmenorrhea: -worsening for a year -with onset of periods, heavier bleeding - uses heavy duty tampons and pads and has to change every 1-3 hours, some clotting, pelvic pain with periods, cramping -periods regular and monthly, no bleeding between between periods, hot flashes, vaginal discharge, no concern for STIs, fevers, pain today -wants to see gynecologist about this -has tried naproxen, not helping, not taking OCP  Chronic Pain in back hip and R leg: -[facility] -saw a pain clinic once but insurance didn't cover at that time -had MRI in the past with specialist and told DDD, had two surgeries for R knee pain -she wants to go back to pain specialist -weakness, numbness (oc in R foot), bowel or bladder incontinence  Migraines: -axert prn  Bipolar and GAD: -goes to [facility] -on clonazepam, prozac, lamictal, lithium -feels shaky at times, radiating tingling feeling at times, "feels like alien implanted in her" -denies SI, stable  HTN: -on clonidine - not sure why on this -stable -denies:CP, SOB, swelling, palpitations   Patient Active Problem List   Diagnosis Date Noted  . Ear pain 07/10/2013  . Migraine headache 07/10/2013  . Accelerated hypertension 02/27/2013  . Low back pain radiating to right leg 05/18/2012   Health Maintenance:  -needs to do physical  ROS: See pertinent positives and negatives per HPI.  Past Medical History  Diagnosis Date  . Hypertension   . Bipolar 1 disorder     Family History  Problem Relation Age of Onset  . Addison's disease Mother   . Diabetes Mother   . Bipolar disorder Mother     History    Social History  . Marital Status: Single    Spouse Name: N/A    Number of Children: N/A  . Years of Education: N/A   Social History Main Topics  . Smoking status: Former Games developermoker  . Smokeless tobacco: None  . Alcohol Use: No  . Drug Use: No  . Sexual Activity: None   Other Topics Concern  . None   Social History Narrative   Work or School: Pharmacist, [employer] dry cleaning      Home Situation: roommate      Spiritual Beliefs:mormon      Lifestyle: no regular exercise; horrible diet             Current outpatient prescriptions:Biotin (BIOTIN 5000) 5 MG CAPS, Take by mouth., Disp: , Rfl: ;  Cholecalciferol (D3 DOTS) 2000 UNITS TBDP, Take by mouth., Disp: , Rfl: ;  clonazePAM (KLONOPIN) 0.5 MG tablet, Take 0.5 mg by mouth. 1 in the AM and 2 in the PM, Disp: , Rfl: ;  cloNIDine (CATAPRES) 0.2 MG tablet, Take 1 tablet (0.2 mg total) by mouth 2 (two) times daily. RAN OUT, Disp: 60 tablet, Rfl: 7 co-enzyme Q-10 50 MG capsule, Take 50 mg by mouth daily., Disp: , Rfl: ;  cyclobenzaprine (FLEXERIL) 10 MG tablet, Take 1 tablet (10 mg total) by mouth 3 (three) times daily., Disp: 90 tablet, Rfl: 7;  FLUoxetine (PROZAC) 40 MG capsule, Take 40 mg by mouth daily., Disp: , Rfl: ;  lamoTRIgine (LAMICTAL) 150 MG tablet, Take 150 mg by mouth. 2 tabs  at bedtime, Disp: , Rfl:  LITHIUM CARBONATE ER PO, Take 450 mg by mouth. 2 tabs at bedtime, Disp: , Rfl: ;  Potassium Gluconate 595 MG CAPS, Take by mouth., Disp: , Rfl: ;  traMADol (ULTRAM) 50 MG tablet, Take 1 tablet (50 mg total) by mouth every 6 (six) hours as needed., Disp: 90 tablet, Rfl: 5;  almotriptan (AXERT) 6.25 MG tablet, Take 1 tablet (6.25 mg total) by mouth 2 (two) times daily as needed for migraine. may repeat in 2 hours if needed, Disp: 20 tablet, Rfl: 0  EXAM:  Filed Vitals:   03/08/14 0923  BP: 122/80  Pulse: 81  Temp: 98.9 F (37.2 C)    Body mass index is 40.6 kg/(m^2).  GENERAL: vitals reviewed and listed above, alert,  oriented, appears well hydrated and in no acute distress  HEENT: atraumatic, conjunttiva clear, no obvious abnormalities on inspection of external nose and ears  NECK: no obvious masses on inspection  LUNGS: clear to auscultation bilaterally, no wheezes, rales or rhonchi, good air movement  CV: HRRR, no peripheral edema  MS: moves all extremities without noticeable abnormality  PSYCH: pleasant and cooperative, no obvious depression or anxiety  ASSESSMENT AND PLAN:  Discussed the following assessment and plan:  Low back pain radiating to right leg - Plan: Ambulatory referral to Physical Medicine Rehab  Menorrhagia - Plan: CBC with Differential, Ambulatory referral to Gynecology, TSH, T4, Free  Dysmenorrhea - Plan: CBC with Differential, Ambulatory referral to Gynecology, TSH, T4, Free  Obesity - Plan: Lipid Panel, Hemoglobin A1c  Accelerated hypertension - Plan: Basic metabolic panel  Bipolar disorder - Plan: Lithium level  -We reviewed the PMH, PSH, FH, SH, Meds and Allergies. -We provided refills for any medications we will prescribe as needed. -We addressed current concerns per orders and patient instructions. -We have asked for records for pertinent exams, studies, vaccines and notes from previous providers. -We have advised patient to follow up per instructions below. -greater then 40 minutes  spent face to face with this patient, > 50% of time in counseling this patient on the etiology, treatment, risks of chronic pain treatment and her other medical problems. We discuss polypharmacy and risks and potential for serious intreactions, limitations of opiods in chronic pain management, importance of exercise, healthy diet and counseling.  -Patient advised to return or notify a doctor immediately if symptoms worsen or persist or new concerns arise.  Patient Instructions  -We have ordered labs or studies at this visit. It can take up to 1-2 weeks for results and processing. We  will contact you with instructions IF your results are abnormal. Normal results will be released to your [facility]. If you have not heard from us or can not find your results in [facility] in 2 weeks please contact our office.  -We placed a referral for you as discussed to the gynecologist and physical medicine doctor. It usually takes about 1-2 weeks to process and schedule this referral. If you have not heard from us regarding this appointment in 2 weeks please contact our office.  -PLEASE SIGN UP FOR MYCHART TODAY   We recommend the following healthy lifestyle measures: - eat a healthy diet consisting of lots of vegetables, fruits, beans, nuts, seeds, healthy meats such as white chicken and fish and whole grains.  - avoid fried foods, fast food, processed foods, sodas, red meet and other fattening foods.  - get a least 150 minutes of aerobic exercise per week.   Follow up in: 3  months or sooner as needed      Kriste Basque R.

## 2014-03-08 NOTE — Patient Instructions (Signed)
-  We have ordered labs or studies at this visit. It can take up to 1-2 weeks for results and processing. We will contact you with instructions IF your results are abnormal. Normal results will be released to your [facility]. If you have not heard from us or can not find your results in [facility] in 2 weeks please contact our office.  -We placed a referral for you as discussed to the gynecologist and physical medicine doctor. It usually takes about 1-2 weeks to process and schedule this referral. If you have not heard from us regarding this appointment in 2 weeks please contact our office.  -PLEASE SIGN UP FOR MYCHART TODAY   We recommend the following healthy lifestyle measures: - eat a healthy diet consisting of lots of vegetables, fruits, beans, nuts, seeds, healthy meats such as white chicken and fish and whole grains.  - avoid fried foods, fast food, processed foods, sodas, red meet and other fattening foods.  - get a least 150 minutes of aerobic exercise per week.   Follow up in: 3 months or sooner as needed

## 2014-03-08 NOTE — Telephone Encounter (Signed)
LMTCB to schedule a new patient doctor referral appointment. °

## 2014-03-09 ENCOUNTER — Telehealth: Payer: Self-pay | Admitting: Family Medicine

## 2014-03-09 LAB — LITHIUM LEVEL: LITHIUM LVL: 0.7 meq/L — AB (ref 0.80–1.40)

## 2014-03-09 NOTE — Telephone Encounter (Signed)
Scheduled

## 2014-03-09 NOTE — Telephone Encounter (Signed)
Relevant patient education assigned to patient using Emmi. ° °

## 2014-03-29 ENCOUNTER — Encounter: Payer: Self-pay | Admitting: Physical Medicine & Rehabilitation

## 2014-04-18 ENCOUNTER — Encounter: Payer: Self-pay | Admitting: Obstetrics and Gynecology

## 2014-04-18 ENCOUNTER — Ambulatory Visit (INDEPENDENT_AMBULATORY_CARE_PROVIDER_SITE_OTHER): Payer: BC Managed Care – PPO | Admitting: Obstetrics and Gynecology

## 2014-04-18 VITALS — BP 118/70 | HR 72 | Resp 16 | Ht 65.0 in | Wt 249.0 lb

## 2014-04-18 DIAGNOSIS — R159 Full incontinence of feces: Secondary | ICD-10-CM

## 2014-04-18 DIAGNOSIS — N946 Dysmenorrhea, unspecified: Secondary | ICD-10-CM

## 2014-04-18 DIAGNOSIS — N920 Excessive and frequent menstruation with regular cycle: Secondary | ICD-10-CM

## 2014-04-18 DIAGNOSIS — Z0000 Encounter for general adult medical examination without abnormal findings: Secondary | ICD-10-CM

## 2014-04-18 LAB — POCT URINALYSIS DIPSTICK
Leukocytes, UA: NEGATIVE
UROBILINOGEN UA: NEGATIVE
pH, UA: 7

## 2014-04-18 NOTE — Progress Notes (Signed)
GYNECOLOGY VISIT  PCP: Kriste Basque, MD  Referring provider:  Kriste Basque, MD  HPI: 38 y.o.   Single  Caucasian  female   G1P1001 with Patient's last menstrual period was 03/25/2014.   here for 1 - 2 year history of increasingly heavy menses and cramping.  "I want you to take it all out."  Monthly cycles. Uses poise pads for heavy bleeding.  Feels swollen inside and has difficulty voiding.  Pain in the LLQ just during her menses.  Light spotting prior to menses for the last two cycles.  No prior pelvic ultrasound. No prior laparoscopy.   Has used oral contraceptives in the past and did not get benefit of treating cramping or the bleeding.  Used Lo-Ovral several years ago.   Declines future childbearing.  Child that she had was placed for adoption.  Does not have contact with the child.   Had a normal CBC and TSH with PCP one month ago.  Lithium level was low one month ago and patient is transferring to a new Psychiatry service.  Has loss of control of stool. Occurring for years.  Not constant.  Is minimal.  Can have pain with bowel movements. Had a vaginal delivery. Does not recall if had extensive tearing.   History of internal hemorrhoid and rectal prolapse.   Hgb:  PCP Urine:  Trace RBC'S  GYNECOLOGIC HISTORY: Patient's last menstrual period was 03/25/2014. Sexually active:  No.  Not sexually active since 2008. Partner preference: Female  Contraception: None Menopausal hormone therapy: None DES exposure:  none Blood transfusions:   none Sexually transmitted diseases:   none GYN procedures and prior surgeries:  none Last mammogram: never had one                Last pap and high risk HPV testing:   11/24/11 NEG no HPV History of abnormal pap smear:  no   OB History   Grav Para Term Preterm Abortions TAB SAB Ect Mult Living   1 1 1       1        LIFESTYLE: Exercise: not currently exercising (walking at work)          Tobacco: no Alcohol: no Drug use:   no  OTHER HEALTH MAINTENANCE: Tetanus/TDap: not sure Gardisil: n/a Influenza:  06/2013 Zostavax: n/a  Bone density: n/a Colonoscopy: n/a  Cholesterol check: 03/08/14 ; Total Cholesterol- 204   Family History  Problem Relation Age of Onset  . Addison's disease Mother   . Diabetes Mother   . Bipolar disorder Mother   . Fibroids Maternal Aunt     Patient Active Problem List   Diagnosis Date Noted  . Ear pain 07/10/2013  . Migraine headache 07/10/2013  . Accelerated hypertension 02/27/2013  . Low back pain radiating to right leg 05/18/2012   Past Medical History  Diagnosis Date  . Hypertension   . Bipolar 1 disorder   . Anxiety   . Depression   . Dysmenorrhea     Past Surgical History  Procedure Laterality Date  . Shoulder arthroscopy Right 2007  . Knee arthroscopy Right 2003  . Kidney stone surgery  2007  . Wisdom tooth extraction  Highschool  . Knee arthroscopy Right 2007    ALLERGIES: Clindamycin/lincomycin and Nubain  Current Outpatient Prescriptions  Medication Sig Dispense Refill  . almotriptan (AXERT) 6.25 MG tablet Take 1 tablet (6.25 mg total) by mouth 2 (two) times daily as needed for migraine. may repeat in 2 hours if  needed  20 tablet  0  . Biotin (BIOTIN 5000) 5 MG CAPS Take by mouth.      . Cholecalciferol (D3 DOTS) 2000 UNITS TBDP Take by mouth.      . clonazePAM (KLONOPIN) 0.5 MG tablet Take 0.5 mg by mouth. 1 in the AM and 2 in the PM      . cloNIDine (CATAPRES) 0.2 MG tablet Take 1 tablet (0.2 mg total) by mouth 2 (two) times daily. RAN OUT  60 tablet  7  . co-enzyme Q-10 50 MG capsule Take 50 mg by mouth daily.      . cyclobenzaprine (FLEXERIL) 10 MG tablet Take 1 tablet (10 mg total) by mouth 3 (three) times daily.  90 tablet  7  . FLUoxetine (PROZAC) 40 MG capsule Take 40 mg by mouth daily.      Marland Kitchen lamoTRIgine (LAMICTAL) 150 MG tablet Take 150 mg by mouth. 2 tabs at bedtime      . LITHIUM CARBONATE ER PO Take 450 mg by mouth. 2 tabs at bedtime       . Potassium Gluconate 595 MG CAPS Take by mouth.      . rizatriptan (MAXALT-MLT) 5 MG disintegrating tablet       . traMADol (ULTRAM) 50 MG tablet Take 1 tablet (50 mg total) by mouth every 6 (six) hours as needed.  90 tablet  5   No current facility-administered medications for this visit.     ROS:  Pertinent items are noted in HPI.  SOCIAL HISTORY:    PHYSICAL EXAMINATION:    BP 118/70  Pulse 72  Resp 16  Ht 5\' 5"  (1.651 m)  Wt 249 lb (112.946 kg)  BMI 41.44 kg/m2  LMP 03/25/2014   Wt Readings from Last 3 Encounters:  04/18/14 249 lb (112.946 kg)  03/08/14 244 lb (110.678 kg)  01/01/14 250 lb (113.399 kg)     Ht Readings from Last 3 Encounters:  04/18/14 5\' 5"  (1.651 m)  03/08/14 5\' 5"  (1.651 m)  01/01/14 5\' 5"  (1.651 m)    General appearance: alert, cooperative and appears stated age Head: Normocephalic, without obvious abnormality, atraumatic Neck: no adenopathy, supple, symmetrical, trachea midline and thyroid not enlarged, symmetric, no tenderness/mass/nodules Lungs: clear to auscultation bilaterally Heart: regular rate and rhythm Abdomen: obese, soft, non-tender; no masses,  no organomegaly Extremities: extremities normal, atraumatic, no cyanosis or edema Skin: Skin color, texture, turgor normal. No rashes or lesions Lymph nodes: Cervical, supraclavicular, and axillary nodes normal. No abnormal inguinal nodes palpated Neurologic: Grossly normal  Pelvic: External genitalia:  no lesions              Urethra:  normal appearing urethra with no masses, tenderness or lesions              Bartholins and Skenes: normal                 Vagina: normal appearing vagina with normal color and discharge, no lesions              Cervix: normal appearance                       Bimanual Exam:  Uterus:  uterus is normal size, shape, consistency and nontender                                      Adnexa: normal  adnexa in size, nontender and no masses                                        ASSESSMENT  Dysmenorrhea. Menorrhagia.  Fecal incontinence.  Bipolar disorder.  Low lithium level.   PLAN  Preliminary discussion about dysmenorrhea and menorrhagia.  Will do GC/CT.  Return for pelvic ultrasound.  Options for care of above after completes ultrasound visit. Metamucil daily.  Needs to see psychiatry for re-evaluation of lithium level.  Will need full annual examination also.     An After Visit Summary was printed and given to the patient.

## 2014-04-18 NOTE — Patient Instructions (Signed)

## 2014-04-19 ENCOUNTER — Telehealth: Payer: Self-pay | Admitting: Obstetrics and Gynecology

## 2014-04-19 LAB — GC/CHLAMYDIA PROBE AMP, URINE
CHLAMYDIA, SWAB/URINE, PCR: NEGATIVE
GC PROBE AMP, URINE: NEGATIVE

## 2014-04-19 NOTE — Telephone Encounter (Signed)
Left message for patient to call back. Need to go over benefits and schedule PUS. PR: $5

## 2014-04-30 ENCOUNTER — Encounter: Payer: Self-pay | Admitting: Internal Medicine

## 2014-04-30 ENCOUNTER — Ambulatory Visit (INDEPENDENT_AMBULATORY_CARE_PROVIDER_SITE_OTHER): Payer: BC Managed Care – PPO | Admitting: Internal Medicine

## 2014-04-30 VITALS — BP 130/90 | HR 95 | Temp 98.5°F | Resp 20 | Ht 65.0 in | Wt 252.0 lb

## 2014-04-30 DIAGNOSIS — J018 Other acute sinusitis: Secondary | ICD-10-CM

## 2014-04-30 DIAGNOSIS — I10 Essential (primary) hypertension: Secondary | ICD-10-CM

## 2014-04-30 DIAGNOSIS — G43109 Migraine with aura, not intractable, without status migrainosus: Secondary | ICD-10-CM

## 2014-04-30 MED ORDER — AMOXICILLIN-POT CLAVULANATE 875-125 MG PO TABS: 1.0000 | ORAL_TABLET | Freq: Two times a day (BID) | ORAL | Status: DC

## 2014-04-30 NOTE — Patient Instructions (Signed)
Take your antibiotic as prescribed until ALL of it is gone, but stop if you develop a rash, swelling, or any side effects of the medication.  Contact our office as soon as possible if  there are side effects of the medication.   Use saline irrigation, warm  moist compresses and over-the-counter decongestants only as directed.  Call if there is no improvement in 5 to 7 days, or sooner if you develop increasing pain, fever, or any new symptoms. 

## 2014-04-30 NOTE — Progress Notes (Signed)
Subjective:    Patient ID: Maria Powers, female    DOB: June 12, 1976, 38 y.o.   MRN: [id number]  HPI 38 year old patient who has a history of migraine headaches.  She also states she has had a history of sinus infections in the past.  For the past week.  She has had sinus congestion and some postnasal drip.  Yesterday she had the onset of severe generalized headache.  She describes some postnasal drip, but no significant purulent nasal discharge.  Headache is diffuse and nonfocal.  Headache is described as severe and qualitatively different from her usual migraine headaches.  She states the headache is summer to headaches associated with sinus infections in the past.  Past Medical History  Diagnosis Date  . Hypertension   . Bipolar 1 disorder   . Anxiety   . Depression   . Dysmenorrhea     History   Social History  . Marital Status: Single    Spouse Name: N/A    Number of Children: N/A  . Years of Education: N/A   Occupational History  . Not on file.   Social History Main Topics  . Smoking status: Former Games developer  . Smokeless tobacco: Not on file  . Alcohol Use: No  . Drug Use: No  . Sexual Activity: Not on file   Other Topics Concern  . Not on file   Social History Narrative   Work or School: Pharmacist, hospital      Home Situation: roommate      Spiritual Beliefs:mormon      Lifestyle: no regular exercise; horrible diet             Past Surgical History  Procedure Laterality Date  . Shoulder arthroscopy Right 2007  . Knee arthroscopy Right 2003  . Kidney stone surgery  2007  . Wisdom tooth extraction  Highschool  . Knee arthroscopy Right 2007    Family History  Problem Relation Age of Onset  . Addison's disease Mother   . Diabetes Mother   . Bipolar disorder Mother   . Fibroids Maternal Aunt     Allergies  Allergen Reactions  . Clindamycin/Lincomycin Nausea And Vomiting  . Nubain [Nalbuphine Hcl]     Current Outpatient  Prescriptions on File Prior to Visit  Medication Sig Dispense Refill  . almotriptan (AXERT) 6.25 MG tablet Take 1 tablet (6.25 mg total) by mouth 2 (two) times daily as needed for migraine. may repeat in 2 hours if needed  20 tablet  0  . Biotin (BIOTIN 5000) 5 MG CAPS Take by mouth.      . Cholecalciferol (D3 DOTS) 2000 UNITS TBDP Take by mouth.      . clonazePAM (KLONOPIN) 0.5 MG tablet Take 0.5 mg by mouth. 1 in the AM and 2 in the PM      . cloNIDine (CATAPRES) 0.2 MG tablet Take 1 tablet (0.2 mg total) by mouth 2 (two) times daily. RAN OUT  60 tablet  7  . co-enzyme Q-10 50 MG capsule Take 50 mg by mouth daily.      . cyclobenzaprine (FLEXERIL) 10 MG tablet Take 1 tablet (10 mg total) by mouth 3 (three) times daily.  90 tablet  7  . FLUoxetine (PROZAC) 40 MG capsule Take 40 mg by mouth daily.      Marland Kitchen lamoTRIgine (LAMICTAL) 150 MG tablet Take 150 mg by mouth. 2 tabs at bedtime      . LITHIUM CARBONATE ER PO Take 450 mg  by mouth. 2 tabs at bedtime      . Potassium Gluconate 595 MG CAPS Take by mouth.      . rizatriptan (MAXALT-MLT) 5 MG disintegrating tablet       . traMADol (ULTRAM) 50 MG tablet Take 1 tablet (50 mg total) by mouth every 6 (six) hours as needed.  90 tablet  5   No current facility-administered medications on file prior to visit.    BP 130/90  Pulse 95  Temp(Src) 98.5 F (36.9 C) (Oral)  Resp 20  Ht 5\' 5"  (1.651 m)  Wt 252 lb (114.306 kg)  BMI 41.93 kg/m2  SpO2 99%  LMP 03/25/2014      Review of Systems  Constitutional: Positive for activity change, appetite change and fatigue. Negative for fever.  HENT: Positive for ear pain, postnasal drip, rhinorrhea and sinus pressure. Negative for congestion, dental problem, hearing loss, sore throat and tinnitus.   Eyes: Negative for pain, discharge and visual disturbance.  Respiratory: Negative for cough and shortness of breath.   Cardiovascular: Negative for chest pain, palpitations and leg swelling.    Gastrointestinal: Negative for nausea, vomiting, abdominal pain, diarrhea, constipation, blood in stool and abdominal distention.  Genitourinary: Negative for dysuria, urgency, frequency, hematuria, flank pain, vaginal bleeding, vaginal discharge, difficulty urinating, vaginal pain and pelvic pain.  Musculoskeletal: Negative for arthralgias, gait problem and joint swelling.  Skin: Negative for rash.  Neurological: Positive for headaches. Negative for dizziness, syncope, speech difficulty, weakness and numbness.  Hematological: Negative for adenopathy.  Psychiatric/Behavioral: Negative for behavioral problems, dysphoric mood and agitation. The patient is not nervous/anxious.        Objective:   Physical Exam  Constitutional: She is oriented to person, place, and time. She appears well-developed and well-nourished.  Appears unwell, but in no acute distress Afebrile Normal blood pressure  HENT:  Head: Normocephalic.  Right Ear: External ear normal.  Left Ear: External ear normal.  Mouth/Throat: Oropharynx is clear and moist.  Subjective Tenderness in both frontal and both maxillary sinus areas  The left tympanic membrane was slightly dull with loss of light reflex, but not erythematous.  The right tympanic membrane was normal  Eyes: Conjunctivae and EOM are normal. Pupils are equal, round, and reactive to light.  Neck: Normal range of motion. Neck supple. No thyromegaly present.  Cardiovascular: Normal rate, regular rhythm, normal heart sounds and intact distal pulses.   Pulmonary/Chest: Effort normal and breath sounds normal.  Abdominal: Soft. Bowel sounds are normal. She exhibits no mass. There is no tenderness.  Musculoskeletal: Normal range of motion.  Lymphadenopathy:    She has no cervical adenopathy.  Neurological: She is alert and oriented to person, place, and time.  Skin: Skin is warm and dry. No rash noted.  Psychiatric: She has a normal mood and affect. Her behavior is  normal.          Assessment & Plan:   Headache syndrome/possible early sinusitis.  We'll treat with Augmentin and saline irrigation, as well as fluticasone Hypertension stable History migraine headaches

## 2014-04-30 NOTE — Progress Notes (Signed)
Pre visit review using our clinic review tool, if applicable. No additional management support is needed unless otherwise documented below in the visit note. 

## 2014-05-03 ENCOUNTER — Ambulatory Visit (INDEPENDENT_AMBULATORY_CARE_PROVIDER_SITE_OTHER): Payer: BC Managed Care – PPO | Admitting: Obstetrics and Gynecology

## 2014-05-03 ENCOUNTER — Encounter: Payer: Self-pay | Admitting: Obstetrics and Gynecology

## 2014-05-03 ENCOUNTER — Ambulatory Visit (INDEPENDENT_AMBULATORY_CARE_PROVIDER_SITE_OTHER): Payer: BC Managed Care – PPO

## 2014-05-03 VITALS — BP 124/84 | HR 88 | Resp 20 | Ht 65.0 in | Wt 249.0 lb

## 2014-05-03 DIAGNOSIS — N946 Dysmenorrhea, unspecified: Secondary | ICD-10-CM

## 2014-05-03 DIAGNOSIS — N920 Excessive and frequent menstruation with regular cycle: Secondary | ICD-10-CM

## 2014-05-03 MED ORDER — NORETHINDRONE 0.35 MG PO TABS: 1.0000 | ORAL_TABLET | Freq: Every day | ORAL | Status: DC

## 2014-05-03 NOTE — Progress Notes (Signed)
Patient ID: Maria Powers, female   DOB: 14-Oct-1975, 38 y.o.   MRN: [id number] GYNECOLOGY  VISIT   HPI: 38 y.o.   Single  Caucasian  female   G1P1001 with Patient's last menstrual period was 05/03/2014.   here for  Pelvic ultrasound to evaluate dysmenorrhea and  Menorrhagia. GC/CT negative.   GYNECOLOGIC HISTORY: Patient's last menstrual period was 05/03/2014. Contraception:  None   Menopausal hormone therapy: n/a        OB History   Grav Para Term Preterm Abortions TAB SAB Ect Mult Living   1 1 1       1          Patient Active Problem List   Diagnosis Date Noted  . Essential hypertension, benign 04/30/2014  . Menorrhagia 04/18/2014  . Dysmenorrhea 04/18/2014  . Ear pain 07/10/2013  . Migraine headache 07/10/2013  . Accelerated hypertension 02/27/2013  . Low back pain radiating to right leg 05/18/2012    Past Medical History  Diagnosis Date  . Hypertension   . Bipolar 1 disorder   . Anxiety   . Depression   . Dysmenorrhea     Past Surgical History  Procedure Laterality Date  . Shoulder arthroscopy Right 2007  . Knee arthroscopy Right 2003  . Kidney stone surgery  2007  . Wisdom tooth extraction  Highschool  . Knee arthroscopy Right 2007    Current Outpatient Prescriptions  Medication Sig Dispense Refill  . almotriptan (AXERT) 6.25 MG tablet Take 1 tablet (6.25 mg total) by mouth 2 (two) times daily as needed for migraine. may repeat in 2 hours if needed  20 tablet  0  . amoxicillin-clavulanate (AUGMENTIN) 875-125 MG per tablet Take 1 tablet by mouth 2 (two) times daily.  20 tablet  0  . Biotin (BIOTIN 5000) 5 MG CAPS Take by mouth.      . Cholecalciferol (D3 DOTS) 2000 UNITS TBDP Take by mouth.      . clonazePAM (KLONOPIN) 0.5 MG tablet Take 0.5 mg by mouth. 1 in the AM and 2 in the PM      . cloNIDine (CATAPRES) 0.2 MG tablet Take 1 tablet (0.2 mg total) by mouth 2 (two) times daily. RAN OUT  60 tablet  7  . co-enzyme Q-10 50 MG capsule Take 50 mg by mouth  daily.      . cyclobenzaprine (FLEXERIL) 10 MG tablet Take 1 tablet (10 mg total) by mouth 3 (three) times daily.  90 tablet  7  . FLUoxetine (PROZAC) 40 MG capsule Take 40 mg by mouth daily.      Marland Kitchen lamoTRIgine (LAMICTAL) 150 MG tablet Take 150 mg by mouth. 2 tabs at bedtime      . LITHIUM CARBONATE ER PO Take 450 mg by mouth. 2 tabs at bedtime      . Potassium Gluconate 595 MG CAPS Take by mouth.      . rizatriptan (MAXALT-MLT) 5 MG disintegrating tablet       . traMADol (ULTRAM) 50 MG tablet Take 1 tablet (50 mg total) by mouth every 6 (six) hours as needed.  90 tablet  5   No current facility-administered medications for this visit.     ALLERGIES: Clindamycin/lincomycin and Nubain  Family History  Problem Relation Age of Onset  . Addison's disease Mother   . Diabetes Mother   . Bipolar disorder Mother   . Fibroids Maternal Aunt     History   Social History  . Marital Status: Single  Spouse Name: N/A    Number of Children: N/A  . Years of Education: N/A   Occupational History  . Not on file.   Social History Main Topics  . Smoking status: Former Games developermoker  . Smokeless tobacco: Not on file  . Alcohol Use: No  . Drug Use: No  . Sexual Activity: Not on file   Other Topics Concern  . Not on file   Social History Narrative   Work or School: Pharmacist, [employer] service dry cleaning      Home Situation: roommate      Spiritual Beliefs:mormon      Lifestyle: no regular exercise; horrible diet             ROS:  Pertinent items are noted in HPI.  PHYSICAL EXAMINATION:    Ht 5\' 5"  (1.651 m)  Wt 249 lb (112.946 kg)  BMI 41.44 kg/m2  LMP 05/03/2014     General appearance: alert, cooperative and appears stated age  Ultrasound today showing normal uterus, EMS and ovaries.  No free fluid.  Images and reports reviewed with patient.   ASSESSMENT  Menorrhagia. Dysmenorrhea.  HTN.  Migraine headaches. Bipolar disorder.   PLAN    Counseled Ortho Micronor use and  side effects.  Will start today.  See labs:  No. Return in 3 months for a recheck and annual exam.   An After Visit Summary was printed and given to the patient.  __15____ minutes face to face time of which over 50% was spent in counseling.

## 2014-05-03 NOTE — Progress Notes (Signed)
See above

## 2014-05-03 NOTE — Patient Instructions (Signed)
Norethindrone tablets (contraception) What is this medicine? NORETHINDRONE (nor eth IN drone) is an oral contraceptive. The product contains a female hormone known as a progestin. It is used to prevent pregnancy. This medicine may be used for other purposes; ask your health care provider or pharmacist if you have questions. COMMON BRAND NAME(S): Camila, Deblitane 28-Day, Errin, Heather, Jencycla, Jolivette, Lyza, Nor-QD, Nora-BE, Norlyroc, Ortho Micronor, Sharobel 28-Day What should I tell my health care provider before I take this medicine? They need to know if you have any of these conditions: -blood vessel disease or blood clots -breast, cervical, or vaginal cancer -diabetes -heart disease -kidney disease -liver disease -mental depression -migraine -seizures -stroke -vaginal bleeding -an unusual or allergic reaction to norethindrone, other medicines, foods, dyes, or preservatives -pregnant or trying to get pregnant -breast-feeding How should I use this medicine? Take this medicine by mouth with a glass of water. You may take it with or without food. Follow the directions on the prescription label. Take this medicine at the same time each day and in the order directed on the package. Do not take your medicine more often than directed. Contact your pediatrician regarding the use of this medicine in children. Special care may be needed. This medicine has been used in female children who have started having menstrual periods. A patient package insert for the product will be given with each prescription and refill. Read this sheet carefully each time. The sheet may change frequently. Overdosage: If you think you have taken too much of this medicine contact a poison control center or emergency room at once. NOTE: This medicine is only for you. Do not share this medicine with others. What if I miss a dose? Try not to miss a dose. Every time you miss a dose or take a dose late your chance of  pregnancy increases. When 1 pill is missed (even if only 3 hours late), take the missed pill as soon as possible and continue taking a pill each day at the regular time (use a back up method of birth control for the next 48 hours). If more than 1 dose is missed, use an additional birth control method for the rest of your pill pack until menses occurs. Contact your health care professional if more than 1 dose has been missed. What may interact with this medicine? Do not take this medicine with any of the following medications: -amprenavir or fosamprenavir -bosentan This medicine may also interact with the following medications: -antibiotics or medicines for infections, especially rifampin, rifabutin, rifapentine, and griseofulvin, and possibly penicillins or tetracyclines -aprepitant -barbiturate medicines, such as phenobarbital -carbamazepine -felbamate -modafinil -oxcarbazepine -phenytoin -ritonavir or other medicines for HIV infection or AIDS -St. John's wort -topiramate This list may not describe all possible interactions. Give your health care provider a list of all the medicines, herbs, non-prescription drugs, or dietary supplements you use. Also tell them if you smoke, drink alcohol, or use illegal drugs. Some items may interact with your medicine. What should I watch for while using this medicine? Visit your doctor or health care professional for regular checks on your progress. You will need a regular breast and pelvic exam and Pap smear while on this medicine. Use an additional method of birth control during the first cycle that you take these tablets. If you have any reason to think you are pregnant, stop taking this medicine right away and contact your doctor or health care professional. If you are taking this medicine for hormone related problems, it   may take several cycles of use to see improvement in your condition. This medicine does not protect you against HIV infection (AIDS)  or any other sexually transmitted diseases. What side effects may I notice from receiving this medicine? Side effects that you should report to your doctor or health care professional as soon as possible: -breast tenderness or discharge -pain in the abdomen, chest, groin or leg -severe headache -skin rash, itching, or hives -sudden shortness of breath -unusually weak or tired -vision or speech problems -yellowing of skin or eyes Side effects that usually do not require medical attention (report to your doctor or health care professional if they continue or are bothersome): -changes in sexual desire -change in menstrual flow -facial hair growth -fluid retention and swelling -headache -irritability -nausea -weight gain or loss This list may not describe all possible side effects. Call your doctor for medical advice about side effects. You may report side effects to FDA at 1-800-FDA-1088. Where should I keep my medicine? Keep out of the reach of children. Store at room temperature between 15 and 30 degrees C (59 and 86 degrees F). Throw away any unused medicine after the expiration date. NOTE: This sheet is a summary. It may not cover all possible information. If you have questions about this medicine, talk to your doctor, pharmacist, or health care provider.  2015, [employer] Standard. (2012-05-27 16:41:35)  

## 2014-05-18 ENCOUNTER — Encounter: Payer: Self-pay | Admitting: Obstetrics and Gynecology

## 2014-05-25 ENCOUNTER — Ambulatory Visit (HOSPITAL_BASED_OUTPATIENT_CLINIC_OR_DEPARTMENT_OTHER): Payer: BC Managed Care – PPO | Admitting: Physical Medicine & Rehabilitation

## 2014-05-25 ENCOUNTER — Encounter: Payer: Self-pay | Admitting: Physical Medicine & Rehabilitation

## 2014-05-25 ENCOUNTER — Encounter: Payer: BC Managed Care – PPO | Attending: Physical Medicine & Rehabilitation

## 2014-05-25 VITALS — BP 147/77 | HR 90 | Resp 14 | Wt 250.0 lb

## 2014-05-25 DIAGNOSIS — Z79899 Other long term (current) drug therapy: Secondary | ICD-10-CM | POA: Insufficient documentation

## 2014-05-25 DIAGNOSIS — Z5181 Encounter for therapeutic drug level monitoring: Secondary | ICD-10-CM | POA: Insufficient documentation

## 2014-05-25 DIAGNOSIS — G8929 Other chronic pain: Secondary | ICD-10-CM | POA: Insufficient documentation

## 2014-05-25 DIAGNOSIS — M533 Sacrococcygeal disorders, not elsewhere classified: Secondary | ICD-10-CM

## 2014-05-25 DIAGNOSIS — M549 Dorsalgia, unspecified: Secondary | ICD-10-CM | POA: Insufficient documentation

## 2014-05-25 MED ORDER — TRAMADOL HCL 50 MG PO TABS: 50.0000 mg | ORAL_TABLET | Freq: Four times a day (QID) | ORAL | Status: DC | PRN

## 2014-05-25 NOTE — Patient Instructions (Signed)
Next appointment will be for sacroiliac injection. This will help was determined because of your low back and buttocks pain.
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
## 2014-07-03 ENCOUNTER — Ambulatory Visit (HOSPITAL_BASED_OUTPATIENT_CLINIC_OR_DEPARTMENT_OTHER): Payer: BC Managed Care – PPO | Admitting: Physical Medicine & Rehabilitation

## 2014-07-03 ENCOUNTER — Encounter: Payer: Self-pay | Admitting: Physical Medicine & Rehabilitation

## 2014-07-03 ENCOUNTER — Encounter: Payer: BC Managed Care – PPO | Attending: Physical Medicine & Rehabilitation

## 2014-07-03 VITALS — BP 153/91 | HR 108 | Resp 14 | Wt 247.8 lb

## 2014-07-03 DIAGNOSIS — M533 Sacrococcygeal disorders, not elsewhere classified: Secondary | ICD-10-CM | POA: Diagnosis present

## 2014-07-03 MED ORDER — CYCLOBENZAPRINE HCL 10 MG PO TABS: 10.0000 mg | ORAL_TABLET | Freq: Three times a day (TID) | ORAL | Status: DC

## 2014-07-03 NOTE — Patient Instructions (Signed)
Sacroiliac injection was performed today. A combination of a naming medicine plus a cortisone medicine was injected. The injection was done under x-ray guidance. This procedure has been performed to help reduce low back and buttocks pain as well as potentially hip pain. The duration of this injection is variable lasting from hours to  Months. It may repeated if needed. 

## 2014-07-03 NOTE — Progress Notes (Signed)

## 2014-07-03 NOTE — Progress Notes (Signed)
  PROCEDURE RECORD [facility]   Name: Rosita Keadrianne Defalco DOB:12/17/1975 MRN: [id number]  Date:07/03/2014  Physician: Claudette LawsAndrew Kirsteins, MD    Nurse/CMA: Ralene Gasparyan RN   Allergies:  Allergies  Allergen Reactions  . Clindamycin/Lincomycin Nausea And Vomiting  . Nubain [Nalbuphine Hcl]     Consent Signed: Yes.    Is patient diabetic? No.  CBG today?  Pregnant: No. LMP: No LMP recorded. (age 38-55)  LMP 06/25/14  Anticoagulants: no Anti-inflammatory: no Antibiotics: no  Procedure: Right Sacroiliac Position: Prone Start Time: 4:07 End Time: 4:15 Fluoro Time: 13 seconds  RN/CMA Designer, multimediahumaker RN Terrez Ander RN    Time 3:45 4:20    BP 153/91 154/93    Pulse 108 89    Respirations 14 14    O2 Sat 98 100    S/S 6 6    Pain Level 2/10 0/10     D/C home with , patient A & O X 3, D/C instructions reviewed, and sits independently.

## 2014-07-12 ENCOUNTER — Ambulatory Visit: Payer: BC Managed Care – PPO | Admitting: Obstetrics and Gynecology

## 2014-07-12 ENCOUNTER — Ambulatory Visit (INDEPENDENT_AMBULATORY_CARE_PROVIDER_SITE_OTHER): Payer: BC Managed Care – PPO | Admitting: Obstetrics and Gynecology

## 2014-07-12 ENCOUNTER — Encounter: Payer: Self-pay | Admitting: Obstetrics and Gynecology

## 2014-07-12 VITALS — BP 138/90 | HR 72 | Ht 65.0 in | Wt 251.0 lb

## 2014-07-12 DIAGNOSIS — Z23 Encounter for immunization: Secondary | ICD-10-CM

## 2014-07-12 DIAGNOSIS — Z0000 Encounter for general adult medical examination without abnormal findings: Secondary | ICD-10-CM

## 2014-07-12 DIAGNOSIS — Z01419 Encounter for gynecological examination (general) (routine) without abnormal findings: Secondary | ICD-10-CM

## 2014-07-12 LAB — POCT URINALYSIS DIPSTICK
BILIRUBIN UA: NEGATIVE
Glucose, UA: NEGATIVE
Ketones, UA: NEGATIVE
LEUKOCYTES UA: NEGATIVE
NITRITE UA: NEGATIVE
Protein, UA: NEGATIVE
UROBILINOGEN UA: NEGATIVE
pH, UA: 6

## 2014-07-12 NOTE — Patient Instructions (Signed)
EXERCISE AND DIET:  We recommended that you start or continue a regular exercise program for good health. Regular exercise means any activity that makes your heart beat faster and makes you sweat.  We recommend exercising at least 30 minutes per day at least 3 days a week, preferably 4 or 5.  We also recommend a diet low in fat and sugar.  Inactivity, poor dietary choices and obesity can cause diabetes, heart attack, stroke, and kidney damage, among others.    ALCOHOL AND SMOKING:  Women should limit their alcohol intake to no more than 7 drinks/beers/glasses of wine (combined, not each!) per week. Moderation of alcohol intake to this level decreases your risk of breast cancer and liver damage. And of course, no recreational drugs are part of a healthy lifestyle.  And absolutely no smoking or even second hand smoke. Most people know smoking can cause heart and lung diseases, but did you know it also contributes to weakening of your bones? Aging of your skin?  Yellowing of your teeth and nails?  CALCIUM AND VITAMIN D:  Adequate intake of calcium and Vitamin D are recommended.  The recommendations for exact amounts of these supplements seem to change often, but generally speaking 600 mg of calcium (either carbonate or citrate) and 800 units of Vitamin D per day seems prudent. Certain women may benefit from higher intake of Vitamin D.  If you are among these women, your doctor will have told you during your visit.    PAP SMEARS:  Pap smears, to check for cervical cancer or precancers,  have traditionally been done yearly, although recent scientific advances have shown that most women can have pap smears less often.  However, every woman still should have a physical exam from her gynecologist every year. It will include a breast check, inspection of the vulva and vagina to check for abnormal growths or skin changes, a visual exam of the cervix, and then an exam to evaluate the size and shape of the uterus and  ovaries.  And after 37 years of age, a rectal exam is indicated to check for rectal cancers. We will also provide age appropriate advice regarding health maintenance, like when you should have certain vaccines, screening for sexually transmitted diseases, bone density testing, colonoscopy, mammograms, etc.   MAMMOGRAMS:  All women over 40 years old should have a yearly mammogram. Many facilities now offer a "3D" mammogram, which may cost around $50 extra out of pocket. If possible,  we recommend you accept the option to have the 3D mammogram performed.  It both reduces the number of women who will be called back for extra views which then turn out to be normal, and it is better than the routine mammogram at detecting truly abnormal areas.    COLONOSCOPY:  Colonoscopy to screen for colon cancer is recommended for all women at age 50.  We know, you hate the idea of the prep.  We agree, BUT, having colon cancer and not knowing it is worse!!  Colon cancer so often starts as a polyp that can be seen and removed at colonscopy, which can quite literally save your life!  And if your first colonoscopy is normal and you have no family history of colon cancer, most women don't have to have it again for 10 years.  Once every ten years, you can do something that may end up saving your life, right?  We will be happy to help you get it scheduled when you are ready.    Be sure to check your insurance coverage so you understand how much it will cost.  It may be covered as a preventative service at no cost, but you should check your particular policy.     Medroxyprogesterone injection [Contraceptive] What is this medicine? MEDROXYPROGESTERONE (me DROX ee proe JES te rone) contraceptive injections prevent pregnancy. They provide effective birth control for 3 months. Depo-subQ Provera 104 is also used for treating pain related to endometriosis. This medicine may be used for other purposes; ask your health care provider or  pharmacist if you have questions. COMMON BRAND NAME(S): Depo-Provera, Depo-subQ Provera 104 What should I tell my health care provider before I take this medicine? They need to know if you have any of these conditions: -frequently drink alcohol -asthma -blood vessel disease or a history of a blood clot in the lungs or legs -bone disease such as osteoporosis -breast cancer -diabetes -eating disorder (anorexia nervosa or bulimia) -high blood pressure -HIV infection or AIDS -kidney disease -liver disease -mental depression -migraine -seizures (convulsions) -stroke -tobacco smoker -vaginal bleeding -an unusual or allergic reaction to medroxyprogesterone, other hormones, medicines, foods, dyes, or preservatives -pregnant or trying to get pregnant -breast-feeding How should I use this medicine? Depo-Provera Contraceptive injection is given into a muscle. Depo-subQ Provera 104 injection is given under the skin. These injections are given by a health care professional. You must not be pregnant before getting an injection. The injection is usually given during the first 5 days after the start of a menstrual period or 6 weeks after delivery of a baby. Talk to your pediatrician regarding the use of this medicine in children. Special care may be needed. These injections have been used in female children who have started having menstrual periods. Overdosage: If you think you have taken too much of this medicine contact a poison control center or emergency room at once. NOTE: This medicine is only for you. Do not share this medicine with others. What if I miss a dose? Try not to miss a dose. You must get an injection once every 3 months to maintain birth control. If you cannot keep an appointment, call and reschedule it. If you wait longer than 13 weeks between Depo-Provera contraceptive injections or longer than 14 weeks between Depo-subQ Provera 104 injections, you could get pregnant. Use another  method for birth control if you miss your appointment. You may also need a pregnancy test before receiving another injection. What may interact with this medicine? Do not take this medicine with any of the following medications: -bosentan This medicine may also interact with the following medications: -aminoglutethimide -antibiotics or medicines for infections, especially rifampin, rifabutin, rifapentine, and griseofulvin -aprepitant -barbiturate medicines such as phenobarbital or primidone -bexarotene -carbamazepine -medicines for seizures like ethotoin, felbamate, oxcarbazepine, phenytoin, topiramate -modafinil -St. John's wort This list may not describe all possible interactions. Give your health care provider a list of all the medicines, herbs, non-prescription drugs, or dietary supplements you use. Also tell them if you smoke, drink alcohol, or use illegal drugs. Some items may interact with your medicine. What should I watch for while using this medicine? This drug does not protect you against HIV infection (AIDS) or other sexually transmitted diseases. Use of this product may cause you to lose calcium from your bones. Loss of calcium may cause weak bones (osteoporosis). Only use this product for more than 2 years if other forms of birth control are not right for you. The longer you use this product for birth  control the more likely you will be at risk for weak bones. Ask your health care professional how you can keep strong bones. You may have a change in bleeding pattern or irregular periods. Many females stop having periods while taking this drug. If you have received your injections on time, your chance of being pregnant is very low. If you think you may be pregnant, see your health care professional as soon as possible. Tell your health care professional if you want to get pregnant within the next year. The effect of this medicine may last a long time after you get your last  injection. What side effects may I notice from receiving this medicine? Side effects that you should report to your doctor or health care professional as soon as possible: -allergic reactions like skin rash, itching or hives, swelling of the face, lips, or tongue -breast tenderness or discharge -breathing problems -changes in vision -depression -feeling faint or lightheaded, falls -fever -pain in the abdomen, chest, groin, or leg -problems with balance, talking, walking -unusually weak or tired -yellowing of the eyes or skin Side effects that usually do not require medical attention (report to your doctor or health care professional if they continue or are bothersome): -acne -fluid retention and swelling -headache -irregular periods, spotting, or absent periods -temporary pain, itching, or skin reaction at site where injected -weight gain This list may not describe all possible side effects. Call your doctor for medical advice about side effects. You may report side effects to FDA at 1-800-FDA-1088. Where should I keep my medicine? This does not apply. The injection will be given to you by a health care professional. NOTE: This sheet is a summary. It may not cover all possible information. If you have questions about this medicine, talk to your doctor, pharmacist, or health care provider.  2015, Elsevier/Gold Standard. (2008-09-28 18:37:56)

## 2014-07-12 NOTE — Progress Notes (Signed)
38 y.o. G1P1001 SingleCaucasianF here for annual exam.    On Micronor for 3rd month.  Menses are longer but lighter.  Achiness/cramping every day, but it is not heavy.  Having sharp pain more often on her left side.  This pain occurs randomly.  Lasts or a few minutes at at time.  This is much less pain than when she was having her menstrual pain and was not on OCPs. Feels on the fence if she really likes the Micronor.  Wants to try Depo Provera.   Has HTN and migraine headaches.   Patient's last menstrual period was 06/24/2014.          Sexually active: No.  The current method of family planning is oral progesterone-only contraceptive.    Exercising: No.  The patient has a physically strenuous job, but has no regular exercise apart from work.  Smoker:  no  Health Maintenance: Pap:  11/25/11 neg History of abnormal Pap:  No but does have history of condyloma.  MMG:  never Colonoscopy:  never BMD:   never TDaP:  unsure Screening Labs: pcp, Hb today: pcp, Urine today: RBC-small, ph: 6.0   reports that she has quit smoking. She does not have any smokeless tobacco history on file. She reports that she does not drink alcohol or use illicit drugs.  Past Medical History  Diagnosis Date  . Hypertension   . Bipolar 1 disorder   . Anxiety   . Depression   . Dysmenorrhea     Past Surgical History  Procedure Laterality Date  . Shoulder arthroscopy Right 2007  . Knee arthroscopy Right 2003  . Kidney stone surgery  2007  . Wisdom tooth extraction  Highschool  . Knee arthroscopy Right 2007    Current Outpatient Prescriptions  Medication Sig Dispense Refill  . almotriptan (AXERT) 6.25 MG tablet Take 1 tablet (6.25 mg total) by mouth 2 (two) times daily as needed for migraine. may repeat in 2 hours if needed  20 tablet  0  . Biotin (BIOTIN 5000) 5 MG CAPS Take by mouth.      . Cholecalciferol (D3 DOTS) 2000 UNITS TBDP Take by mouth.      . clonazePAM (KLONOPIN) 0.5 MG tablet Take 0.5  mg by mouth. 1 in the AM and 2 in the PM      . cloNIDine (CATAPRES) 0.2 MG tablet Take 1 tablet (0.2 mg total) by mouth 2 (two) times daily. RAN OUT  60 tablet  7  . co-enzyme Q-10 50 MG capsule Take 50 mg by mouth daily.      . cyclobenzaprine (FLEXERIL) 10 MG tablet Take 1 tablet (10 mg total) by mouth 3 (three) times daily.  90 tablet  7  . FLUoxetine (PROZAC) 40 MG capsule Take 40 mg by mouth daily.      Marland Kitchen. gabapentin (NEURONTIN) 100 MG capsule       . lamoTRIgine (LAMICTAL) 150 MG tablet Take 150 mg by mouth. 2 tabs at bedtime      . LITHIUM CARBONATE ER PO Take 450 mg by mouth. 2 tabs at bedtime      . norethindrone (MICRONOR,CAMILA,ERRIN) 0.35 MG tablet Take 1 tablet (0.35 mg total) by mouth daily.  1 Package  2  . Potassium Gluconate 595 MG CAPS Take by mouth.      . rizatriptan (MAXALT-MLT) 5 MG disintegrating tablet       . traMADol (ULTRAM) 50 MG tablet Take 1 tablet (50 mg total) by mouth every 6 (  six) hours as needed.  90 tablet  5   No current facility-administered medications for this visit.    Family History  Problem Relation Age of Onset  . Addison's disease Mother   . Diabetes Mother   . Bipolar disorder Mother   . Fibroids Maternal Aunt     ROS:  Pertinent items are noted in HPI.  Otherwise, a comprehensive ROS was negative.  Exam:   BP 138/90  Pulse 72  Ht 5\' 5"  (1.651 m)  Wt 251 lb (113.853 kg)  BMI 41.77 kg/m2  LMP 06/24/2014    Height: 5\' 5"  (165.1 cm)  Ht Readings from Last 3 Encounters:  07/12/14 5\' 5"  (1.651 m)  05/03/14 5\' 5"  (1.651 m)  04/30/14 5\' 5"  (1.651 m)    General appearance: alert, cooperative and appears stated age Head: Normocephalic, without obvious abnormality, atraumatic Neck: no adenopathy, supple, symmetrical, trachea midline and thyroid normal to inspection and palpation Lungs: clear to auscultation bilaterally Breasts: normal appearance, no masses or tenderness, Inspection negative, No nipple retraction or dimpling, No nipple  discharge or bleeding, No axillary or supraclavicular adenopathy Heart: regular rate and rhythm Abdomen: soft, non-tender; bowel sounds normal; no masses,  no organomegaly Extremities: extremities normal, atraumatic, no cyanosis or edema Skin: Skin color, texture, turgor normal. No rashes or lesions Lymph nodes: Cervical, supraclavicular, and axillary nodes normal. No abnormal inguinal nodes palpated Neurologic: Grossly normal   Pelvic: External genitalia:  no lesions              Urethra:  normal appearing urethra with no masses, tenderness or lesions              Bartholins and Skenes: normal                 Vagina: normal appearing vagina with normal color and discharge, no lesions              Cervix: anteverted              Pap taken: Yes.   Bimanual Exam:  Uterus:  normal size, contour, position, consistency, mobility, non-tender              Adnexa: normal adnexa and no mass, fullness, tenderness               Rectovaginal: Confirms               Anus:  normal sphincter tone, no lesions  A:  Well Woman with normal exam Dysmenorrhea and menorrhagia improved with Micronor but not completely satisfactory.  History of bipolar disorder.  Controlled on medication.   P:   pap smear and HR HPV testing. TDap vaccine.  Will stop Micronor after gets first Depo Provera. Depo Provera 150 mg IM q 3 months for one year.  Call with next cycle. Discussed side effects.  Discussed historical concerns over use in patients with depression and that more recent literature supports its use.  return annually or prn  An After Visit Summary was printed and given to the patient.

## 2014-07-14 MED ORDER — NORETHINDRONE 0.35 MG PO TABS: 1.0000 | ORAL_TABLET | Freq: Every day | ORAL | Status: DC

## 2014-07-18 LAB — IPS PAP TEST WITH HPV

## 2014-07-23 ENCOUNTER — Encounter: Payer: Self-pay | Admitting: Obstetrics and Gynecology

## 2014-07-31 ENCOUNTER — Ambulatory Visit (INDEPENDENT_AMBULATORY_CARE_PROVIDER_SITE_OTHER): Payer: BC Managed Care – PPO | Admitting: *Deleted

## 2014-07-31 VITALS — BP 120/80 | HR 88 | Resp 16 | Wt 247.0 lb

## 2014-07-31 DIAGNOSIS — Z3040 Encounter for surveillance of contraceptives, unspecified: Secondary | ICD-10-CM

## 2014-07-31 MED ORDER — MEDROXYPROGESTERONE ACETATE 150 MG/ML IM SUSP
150.0000 mg | Freq: Once | INTRAMUSCULAR | Status: AC
  Administered 2014-07-31: 150 mg via INTRAMUSCULAR

## 2014-07-31 NOTE — Progress Notes (Signed)
Patient in today for First Depo Injection.  Patient LMP 07/28/14. Patient on Micronor. - Advise pt next Depo 01/26-02/09. Gave her Depo Provera Calendar and advise her not to go over that 2 week period.  - Advise pt to stop Micronor today. - Advise pt to call us if any reactions occur.  - Patient tolerated shot well. Waited 15 minutes before release.

## 2014-08-14 ENCOUNTER — Encounter: Payer: BC Managed Care – PPO | Attending: Physical Medicine & Rehabilitation

## 2014-08-14 ENCOUNTER — Encounter: Payer: Self-pay | Admitting: Physical Medicine & Rehabilitation

## 2014-08-14 ENCOUNTER — Ambulatory Visit (HOSPITAL_BASED_OUTPATIENT_CLINIC_OR_DEPARTMENT_OTHER): Payer: BC Managed Care – PPO | Admitting: Physical Medicine & Rehabilitation

## 2014-08-14 VITALS — BP 140/80 | HR 86 | Resp 14 | Ht 65.0 in | Wt 246.4 lb

## 2014-08-14 DIAGNOSIS — M7918 Myalgia, other site: Secondary | ICD-10-CM

## 2014-08-14 DIAGNOSIS — M533 Sacrococcygeal disorders, not elsewhere classified: Secondary | ICD-10-CM | POA: Diagnosis not present

## 2014-08-14 DIAGNOSIS — M797 Fibromyalgia: Secondary | ICD-10-CM | POA: Diagnosis not present

## 2014-08-14 MED ORDER — GABAPENTIN 100 MG PO CAPS: 100.0000 mg | ORAL_CAPSULE | Freq: Every day | ORAL | Status: DC

## 2014-08-14 NOTE — Progress Notes (Signed)
Subjective:    Patient ID: Maria Powers, female    DOB: 03/29/1976, 38 y.o.   MRN: [id number]  HPI  Right Sacroiliac injection 100% pain relief for several, deep joint pain improved  Some "nerve pain" in R>L leg may go down to ankle, had some left over neurontin 100mg  tabs which is very helpful.  This pain is worse at night    Pain Inventory Average Pain 5 Pain Right Now 2 My pain is constant, dull, stabbing and aching  In the last 24 hours, has pain interfered with the following? General activity 1 Relation with others 0 Enjoyment of life 4 What TIME of day is your pain at its worst? morning and night Sleep (in general) Poor  Pain is worse with: unsure Pain improves with: medication Relief from Meds: 2  Mobility walk without assistance ability to climb steps?  yes do you drive?  yes  Function employed # of hrs/week 40  Neuro/Psych tremor anxiety  Prior Studies Any changes since last visit?  no  Physicians involved in your care Any changes since last visit?  no   Family History  Problem Relation Age of Onset  . Addison's disease Mother   . Diabetes Mother   . Bipolar disorder Mother   . Fibroids Maternal Aunt    History   Social History  . Marital Status: Single    Spouse Name: N/A    Number of Children: N/A  . Years of Education: N/A   Social History Main Topics  . Smoking status: Former Games developermoker  . Smokeless tobacco: None  . Alcohol Use: No  . Drug Use: No  . Sexual Activity:    Partners: Male    Birth Control/ Protection: None   Other Topics Concern  . 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 Shoulder arthroscopy Right 2007  . Knee arthroscopy Right 2003  . Kidney stone surgery  2007  . Wisdom tooth extraction   Highschool  . Knee arthroscopy Right 2007   Past Medical History  Diagnosis Date  . Hypertension   . Bipolar 1 disorder   . Anxiety   . Depression   . Dysmenorrhea    BP 140/80 mmHg  Pulse 86  Resp 14  Ht 5\' 5"  (1.651 m)  Wt 246 lb 6.4 oz (111.766 kg)  BMI 41.00 kg/m2  SpO2 100%  LMP 07/28/2014 (Exact Date)  Opioid Risk Score:   Fall Risk Score: Low Fall Risk (0-5 points)   Review of Systems  Constitutional: Negative.   HENT: Negative.   Eyes: Negative.   Respiratory: Negative.   Cardiovascular: Negative.   Gastrointestinal: Negative.   Endocrine: Negative.   Genitourinary: Negative.   Musculoskeletal: Negative.   Skin: Negative.   Allergic/Immunologic: Negative.   Neurological: Positive for tremors.  Hematological: Negative.   Psychiatric/Behavioral: The patient is nervous/anxious.        Objective:   Physical Exam  Constitutional: She is oriented to person, place, and time. She appears well-developed and well-nourished.  HENT:  Head: Normocephalic.  Musculoskeletal:       Right hip: She exhibits tenderness.       Lumbar back: She exhibits tenderness.  Tenderness over  the right gluteus  No tenderness over right PSIS  Mild tenderness over the right greater trochanter of the hip  Neurological: She is alert and oriented to person, place, and time. She has normal strength. Gait normal.  Psychiatric: She has a normal mood and affect.  Nursing note and vitals reviewed.         Assessment & Plan:  1. Right sacroiliac pain improved after injection. No need for reinjection at the current time  2. Right gluteus pain, previous good relief with trigger point injections. She did feel prior trigger point injections caused a flareup of pain for a couple days and she would like to do this on a Friday so she can recover over a weekend and not have to go to Work  3. Shooting pains right lower extremity, possibly radicular x-ray showing L5-S1 disc Space narrowing,  good relief in the past from Neurontin will write for 100 mg daily at bedtime, if this worsens may need MRI

## 2014-08-28 ENCOUNTER — Encounter: Payer: Self-pay | Admitting: Family Medicine

## 2014-08-28 ENCOUNTER — Ambulatory Visit (INDEPENDENT_AMBULATORY_CARE_PROVIDER_SITE_OTHER): Payer: BC Managed Care – PPO | Admitting: Family Medicine

## 2014-08-28 VITALS — BP 138/82 | HR 92 | Temp 98.3°F | Ht 65.0 in | Wt 248.5 lb

## 2014-08-28 DIAGNOSIS — H9202 Otalgia, left ear: Secondary | ICD-10-CM

## 2014-08-28 DIAGNOSIS — I10 Essential (primary) hypertension: Secondary | ICD-10-CM

## 2014-08-28 MED ORDER — CLONIDINE HCL 0.2 MG PO TABS: 0.2000 mg | ORAL_TABLET | Freq: Two times a day (BID) | ORAL | Status: DC

## 2014-08-28 NOTE — Progress Notes (Signed)
Pre visit review using our clinic review tool, if applicable. No additional management support is needed unless otherwise documented below in the visit note. 

## 2014-08-28 NOTE — Progress Notes (Signed)
HPI:  Acute visit for:  1) L ear pain: -pain, sometimes better, sometimes worse -on abx in the past and this helped a little -does have chronic allergy issues with nasal congestion and sneezing - takes claritin which helps a little -for about 1 year -denies: hearing loss, tinnitus, popping, clicking, TMJ  2)HTN: -on clonidine prior to establishing with me -reports needs refill on this -denies: CP, SOB, swelling   ROS: See pertinent positives and negatives per HPI.  Past Medical History  Diagnosis Date  . Hypertension   . Bipolar 1 disorder   . Anxiety   . Depression   . Dysmenorrhea     Past Surgical History  Procedure Laterality Date  . Shoulder arthroscopy Right 2007  . Knee arthroscopy Right 2003  . Kidney stone surgery  2007  . Wisdom tooth extraction  Highschool  . Knee arthroscopy Right 2007    Family History  Problem Relation Age of Onset  . Addison's disease Mother   . Diabetes Mother   . Bipolar disorder Mother   . Fibroids Maternal Aunt     History   Social History  . Marital Status: Single    Spouse Name: N/A    Number of Children: N/A  . Years of Education: N/A   Social History Main Topics  . Smoking status: Former Games developermoker  . Smokeless tobacco: None  . Alcohol Use: No  . Drug Use: No  . Sexual Activity:    Partners: Male    Birth Control/ Protection: None   Other Topics Concern  . 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 may repeat in 2 hours if needed, Disp: 20 tablet, Rfl: 0;  Biotin (BIOTIN 5000) 5 MG CAPS, Take by mouth., Disp: , Rfl: ;  Cholecalciferol (D3 DOTS) 2000 UNITS TBDP, Take by mouth., Disp: , Rfl: ;   clonazePAM (KLONOPIN) 0.5 MG tablet, Take 0.5 mg by mouth. 1 in the AM and 2 in the PM, Disp: , Rfl:  cloNIDine (CATAPRES) 0.2 MG tablet, Take 1 tablet (0.2 mg total) by mouth 2 (two) times daily. RAN OUT, Disp: 60 tablet, Rfl: 7;  co-enzyme Q-10 50 MG capsule, Take 50 mg by mouth daily., Disp: , Rfl: ;  cyclobenzaprine (FLEXERIL) 10 MG tablet, Take 1 tablet (10 mg total) by mouth 3 (three) times daily., Disp: 90 tablet, Rfl: 7;  FLUoxetine (PROZAC) 40 MG capsule, Take 40 mg by mouth daily., Disp: , Rfl:  gabapentin (NEURONTIN) 100 MG capsule, Take 1 capsule (100 mg total) by mouth at bedtime. Take at bedtime, Disp: 30 capsule, Rfl: 6;  lamoTRIgine (LAMICTAL) 150 MG tablet, Take 150 mg by mouth. 2 tabs at bedtime, Disp: , Rfl: ;  LITHIUM CARBONATE ER PO, Take 450 mg by mouth. 2 tabs at bedtime, Disp: , Rfl: ;  medroxyPROGESTERone (DEPO-PROVERA) 150 MG/ML injection, Inject 150 mg into the muscle every 3 (three) months., Disp: , Rfl:  Potassium Gluconate 595 MG CAPS, Take by mouth., Disp: , Rfl: ;  rizatriptan (MAXALT-MLT) 5 MG disintegrating tablet, , Disp: , Rfl: ;  traMADol (ULTRAM) 50 MG tablet, Take  1 tablet (50 mg total) by mouth every 6 (six) hours as needed., Disp: 90 tablet, Rfl: 5  EXAM:  Filed Vitals:   08/28/14 0907  BP: 138/82  Pulse: 92  Temp: 98.3 F (36.8 C)    Body mass index is 41.35 kg/(m^2).  GENERAL: vitals reviewed and listed above, alert, oriented, appears well hydrated and in no acute distress  HEENT: atraumatic, conjunttiva clear, no obvious abnormalities on inspection of external nose and ears, normal inspection of ear canals, and TMs bilaterally, normal inspection of nose abd mouth, no TMJ crepitus or deviation of jaw or TTP TMJ joints  NECK: no obvious masses on inspection  LUNGS: clear to auscultation bilaterally, no wheezes, rales or rhonchi, good air movement  CV: HRRR, no peripheral edema  MS: moves all extremities without noticeable abnormality  PSYCH:  pleasant and cooperative, no obvious depression or anxiety  ASSESSMENT AND PLAN:  Discussed the following assessment and plan:  Accelerated hypertension  Ear pain, left  -unsure of etiology of her ear pain, normal exam, will have her see ENT - number provided to call -refill BP medication -Patient advised to return or notify a doctor immediately if symptoms worsen or persist or new concerns arise.  There are no Patient Instructions on file for this visit.   Kriste BasqueKIM, Kenadee Gates R.

## 2014-09-07 ENCOUNTER — Encounter: Payer: BC Managed Care – PPO | Attending: Physical Medicine & Rehabilitation

## 2014-09-07 ENCOUNTER — Ambulatory Visit (HOSPITAL_BASED_OUTPATIENT_CLINIC_OR_DEPARTMENT_OTHER): Payer: BC Managed Care – PPO | Admitting: Physical Medicine & Rehabilitation

## 2014-09-07 ENCOUNTER — Encounter: Payer: Self-pay | Admitting: Physical Medicine & Rehabilitation

## 2014-09-07 VITALS — BP 127/69 | HR 81 | Resp 14

## 2014-09-07 DIAGNOSIS — M797 Fibromyalgia: Secondary | ICD-10-CM

## 2014-09-07 DIAGNOSIS — M7918 Myalgia, other site: Secondary | ICD-10-CM

## 2014-09-07 DIAGNOSIS — G8929 Other chronic pain: Secondary | ICD-10-CM

## 2014-09-07 DIAGNOSIS — M533 Sacrococcygeal disorders, not elsewhere classified: Secondary | ICD-10-CM | POA: Diagnosis not present

## 2014-09-07 DIAGNOSIS — M549 Dorsalgia, unspecified: Secondary | ICD-10-CM

## 2014-09-07 NOTE — Patient Instructions (Signed)
Trigger Point Injection Trigger points are areas where you have muscle pain. A trigger point injection is a shot given in the trigger point to relieve that pain. A trigger point might feel like a knot in your muscle. It hurts to press on a trigger point. Sometimes the pain spreads out (radiates) to other parts of the body. For example, pressing on a trigger point in your shoulder might cause pain in your arm or neck. You might have one trigger point. Or, you might have more than one. People often have trigger points in their upper back and lower back. They also occur often in the neck and shoulders. Pain from a trigger point lasts for a long time. It can make it hard to keep moving. You might not be able to do the exercise or physical therapy that could help you deal with the pain. A trigger point injection may help. It does not work for everyone. But, it may relieve your pain for a few days or a few months. A trigger point injection does not cure long-lasting (chronic) pain. LET YOUR CAREGIVER KNOW ABOUT:  Any allergies (especially to latex, lidocaine, or steroids).  Blood-thinning medicines that you take. These drugs can lead to bleeding or bruising after an injection. They include:  Aspirin.  Ibuprofen.  Clopidogrel.  Warfarin.  Other medicines you take. This includes all vitamins, herbs, eyedrops, over-the-counter medicines, and creams.  Use of steroids.  Recent infections.  Past problems with numbing medicines.  Bleeding problems.  Surgeries you have had.  Other health problems. RISKS AND COMPLICATIONS A trigger point injection is a safe treatment. However, problems may develop, such as:  Minor side effects usually go away in 1 to 2 days. These may include:  Soreness.  Bruising.  Stiffness.  More serious problems are rare. But, they may include:  Bleeding under the skin (hematoma).  Skin infection.  Breaking off of the needle under your skin.  Lung  puncture.  The trigger point injection may not work for you. BEFORE THE PROCEDURE You may need to stop taking any medicine that thins your blood. This is to prevent bleeding and bruising. Usually these medicines are stopped several days before the injection. No other preparation is needed. PROCEDURE  A trigger point injection can be given in your caregiver's office or in a clinic. Each injection takes 2 minutes or less.  Your caregiver will feel for trigger points. The caregiver may use a marker to circle the area for the injection.  The skin over the trigger point will be washed with a germ-killing (antiseptic) solution.  The caregiver pinches the spot for the injection.  Then, a very thin needle is used for the shot. You may feel pain or a twitching feeling when the needle enters the trigger point.  A numbing solution may be injected into the trigger point. Sometimes a drug to keep down swelling, redness, and warmth (inflammation) is also injected.  Your caregiver moves the needle around the trigger zone until the tightness and twitching goes away.  After the injection, your caregiver may put gentle pressure over the injection site.  Then it is covered with a bandage. AFTER THE PROCEDURE  You can go right home after the injection.  The bandage can be taken off after a few hours.  You may feel sore and stiff for 1 to 2 days.  Go back to your regular activities slowly. Your caregiver may ask you to stretch your muscles. Do not do anything that takes   extra energy for a few days.  Follow your caregiver's instructions to manage and treat other pain. Document Released: 08/27/2011 Document Revised: 01/02/2013 Document Reviewed: 08/27/2011 Exit[employer] Patient Information 2015 [employer]. This information is not intended to replace advice given to you by your health care provider. Make sure you discuss any questions you have with your health care provider.  

## 2014-09-07 NOTE — Progress Notes (Signed)
Subjective:    Patient ID: Maria Powers, female    DOB: 11/12/1975, 38 y.o.   MRN: [id number]  HPI   Pain Inventory Average Pain 5 Pain Right Now 5 My pain is constant  In the last 24 hours, has pain interfered with the following? General activity 2 Relation with others 0 Enjoyment of life 0 What TIME of day is your pain at its worst? night Sleep (in general) Fair  Pain is worse with: unsure Pain improves with: medication and injections Relief from Meds: 4  Mobility walk without assistance ability to climb steps?  yes do you drive?  yes Do you have any goals in this area?  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  no  Neuro/Psych depression anxiety  Prior Studies Any changes since last visit?  no  Physicians involved in your care Any changes since last visit?  no   Family History  Problem Relation Age of Onset  . Addison's disease Mother   . Diabetes Mother   . Bipolar disorder Mother   . Fibroids Maternal Aunt    History   Social History  . Marital Status: Single    Spouse Name: N/A    Number of Children: N/A  . Years of Education: N/A   Social History Main Topics  . Smoking status: Former Games developermoker  . Smokeless tobacco: None  . Alcohol Use: No  . Drug Use: No  . Sexual Activity:    Partners: Male    Birth Control/ Protection: None   Other Topics Concern  . 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 Shoulder arthroscopy Right 2007  . Knee arthroscopy Right 2003  . Kidney stone surgery  2007  . Wisdom tooth extraction  Highschool  . Knee arthroscopy Right 2007   Past Medical History  Diagnosis Date  . Hypertension   . Bipolar 1 disorder   .  Anxiety   . Depression   . Dysmenorrhea    BP 127/69 mmHg  Pulse 81  Resp 14  SpO2 97%  Opioid Risk Score:   Fall Risk Score: Low Fall Risk (0-5 points) (pt has rec'd pamphlet during previous visit) Review of Systems  Psychiatric/Behavioral: Positive for dysphoric mood. The patient is nervous/anxious.   All other systems reviewed and are negative.      Objective:   Physical Exam        Assessment & Plan:  1.  R gluteus myofascial pain  Trigger Point Injection  Indication: R gluteus Myofascial pain not relieved by medication management and other conservative care.  Informed consent was obtained after describing risk and benefits of the procedure with the patient, this includes bleeding, bruising, infection and medication side effects.  The patient wishes to proceed and has given written consent.  The patient was placed in a prone position.  The R Glut maximus area was marked and prepped with Betadine.  It was entered with a 25-gauge 1-1/2 inch needle and 1 mL of 1% lidocaine was injected into each of 4 trigger points, after negative draw back for blood.  The patient tolerated the procedure  well.  Post procedure instructions were given.

## 2014-10-18 ENCOUNTER — Ambulatory Visit (INDEPENDENT_AMBULATORY_CARE_PROVIDER_SITE_OTHER): Payer: BLUE CROSS/BLUE SHIELD | Admitting: *Deleted

## 2014-10-18 ENCOUNTER — Encounter: Payer: Self-pay | Admitting: Physical Medicine & Rehabilitation

## 2014-10-18 ENCOUNTER — Ambulatory Visit (HOSPITAL_BASED_OUTPATIENT_CLINIC_OR_DEPARTMENT_OTHER): Payer: BLUE CROSS/BLUE SHIELD | Admitting: Physical Medicine & Rehabilitation

## 2014-10-18 ENCOUNTER — Encounter: Payer: BLUE CROSS/BLUE SHIELD | Attending: Physical Medicine & Rehabilitation

## 2014-10-18 VITALS — BP 122/72 | HR 76 | Resp 18 | Ht 65.5 in | Wt 249.6 lb

## 2014-10-18 VITALS — BP 141/81 | HR 86 | Resp 14

## 2014-10-18 DIAGNOSIS — M797 Fibromyalgia: Secondary | ICD-10-CM | POA: Insufficient documentation

## 2014-10-18 DIAGNOSIS — M533 Sacrococcygeal disorders, not elsewhere classified: Secondary | ICD-10-CM | POA: Diagnosis present

## 2014-10-18 DIAGNOSIS — Z3040 Encounter for surveillance of contraceptives, unspecified: Secondary | ICD-10-CM

## 2014-10-18 LAB — HEMOGLOBIN, FINGERSTICK: HEMOGLOBIN, FINGERSTICK: 10.6 g/dL — AB (ref 12.0–16.0)

## 2014-10-18 LAB — POCT URINE PREGNANCY: Preg Test, Ur: NEGATIVE

## 2014-10-18 MED ORDER — MEDROXYPROGESTERONE ACETATE 150 MG/ML IM SUSP
150.0000 mg | Freq: Once | INTRAMUSCULAR | Status: AC
  Administered 2014-10-18: 150 mg via INTRAMUSCULAR

## 2014-10-18 NOTE — Progress Notes (Signed)
Patient is within Depo Provera Calender Limits 1/26-10/30/14 Next Depo Due between: 4/15-4/29/16 Last AEX: 07/12/14 with Dr. Jose PersiaSilva AEX Scheduled: No AEX scheduled for 2016  Patient states she hasn't had any reaction to previous depo. Patient goes on to state that she's had heavy bleeding for the last 2 weeks. I consulted with Dr. Hyacinth MeekerMiller and she said that having bleeding is pretty normal especially when a patient is first starting Depo. Dr. Hyacinth MeekerMiller requested that I get patient's Hemoglobin and a UPT.  Hemoglobin: 10.6 UPT: Negative   Consulted with Dr. Hyacinth MeekerMiller and she said that the patient needs otc iron daily, and if she experiences any dizziness or severe bleeding over the weekend then she needs to go to the emergency room. She would also like patient to come in sometime Monday for follow up with her.  Patient is aware of Dr. Rondel BatonMiller's recommendations.   Is also aware that her AEX is due in October she will call us back to schedule.   Pt tolerated Injection well.  Talked with Kennon RoundsSally, RN in regards to scheduling patient for f/u a appointment is available for 10/22/14 at 10:30.   Left Message To Call Back

## 2014-10-18 NOTE — Progress Notes (Signed)
Called and s/w patient offered her appointment time for Monday morning but Dr. Hyacinth MeekerMiller is in Surgery, offered Tuesday afternoon, but due to patient's work schedule she declined. Doctors are in surgery 2/1 ; 2/2 (Mon/Tuesday) mornings which is the only time when patient is available. Per Kennon RoundsSally, RN advised patient to give us a call mon/tuesday with a update and at that time we will reassess her further. If patient has any dizziness, light headedness, headache, or severe bleeding she is aware to go to the emergency room.  Routed to provider for review (Dr. Hyacinth MeekerMiller), encounter closed. CC: Dr. Edward JollySilva

## 2014-10-18 NOTE — Patient Instructions (Signed)
Sacroiliac injection was performed today. A combination of a naming medicine plus a cortisone medicine was injected. The injection was done under x-ray guidance. This procedure has been performed to help reduce low back and buttocks pain as well as potentially hip pain. The duration of this injection is variable lasting from hours to  Months. It may repeated if needed. 

## 2014-10-18 NOTE — Progress Notes (Signed)
Reviewed personally.  M. Suzanne Katerina Zurn, MD.  

## 2014-10-18 NOTE — Progress Notes (Signed)

## 2014-10-18 NOTE — Progress Notes (Signed)
  PROCEDURE RECORD [facility]   Name: Rosita Keadrianne Alred DOB:07/27/1976 MRN: [id number]  Date:10/18/2014  Physician: Claudette LawsAndrew Kirsteins, MD    Nurse/CMA: Ulysess Witz RN  Allergies:  Allergies  Allergen Reactions  . Clindamycin/Lincomycin Nausea And Vomiting  . Nubain [Nalbuphine Hcl]     Consent Signed: Yes.    Is patient diabetic? No.  CBG today?   Pregnant: No. LMP: Patient's last menstrual period was 10/04/2014. (age 38-56)  Anticoagulants: no Anti-inflammatory: no Antibiotics: no  Procedure: Right Sacroiliac Steroid Injection Position: Prone Start Time: 10:36 End Time: 10:38 Fluoro Time: 10 seconds  RN/CMA Haematologisthumaker RN Perle Brickhouse RN    Time 10:15 10:48    BP 141/81 158/89    Pulse 86 91    Respirations 14 14    O2 Sat 100 99    S/S 6 6    Pain Level 4/10 3/10     D/C home with [name], patient A & O X 3, D/C instructions reviewed, and sits independently.

## 2014-10-21 NOTE — Progress Notes (Signed)
Please schedule a follow up appointment for the patient to see me in 2 weeks to re-evaluate her bleeding profile and response to the Depo Provera.    If she is still having heavy bleeding, please work her in to my schedule so that I may see her sooner.   Thank you.

## 2014-10-24 ENCOUNTER — Telehealth: Payer: Self-pay | Admitting: *Deleted

## 2014-10-24 NOTE — Telephone Encounter (Signed)
Follow-up call to patient. She states bleeding is much better since Depo injection. Bleeding is a little more than spotting but no more than 2 tampons per day.  Denies dizziness, lightheadedness, or SOB. Is taking MVI. Advised if bleeding did not resolve over next two weeks, Dr Edward JollySilva would recommend she call for office visit. Patient verbalized understanding.   Routing to provider for final review. Patient agreeable to disposition. Will close encounter

## 2015-01-03 ENCOUNTER — Ambulatory Visit (HOSPITAL_BASED_OUTPATIENT_CLINIC_OR_DEPARTMENT_OTHER): Payer: BLUE CROSS/BLUE SHIELD | Admitting: Physical Medicine & Rehabilitation

## 2015-01-03 ENCOUNTER — Encounter: Payer: Self-pay | Admitting: Physical Medicine & Rehabilitation

## 2015-01-03 ENCOUNTER — Encounter: Payer: BLUE CROSS/BLUE SHIELD | Attending: Physical Medicine & Rehabilitation

## 2015-01-03 VITALS — BP 137/84 | HR 85 | Resp 14

## 2015-01-03 DIAGNOSIS — M533 Sacrococcygeal disorders, not elsewhere classified: Secondary | ICD-10-CM | POA: Insufficient documentation

## 2015-01-03 DIAGNOSIS — G8929 Other chronic pain: Secondary | ICD-10-CM | POA: Diagnosis not present

## 2015-01-03 DIAGNOSIS — M797 Fibromyalgia: Secondary | ICD-10-CM | POA: Insufficient documentation

## 2015-01-03 DIAGNOSIS — M549 Dorsalgia, unspecified: Secondary | ICD-10-CM

## 2015-01-03 MED ORDER — CYCLOBENZAPRINE HCL 10 MG PO TABS: 10.0000 mg | ORAL_TABLET | Freq: Three times a day (TID) | ORAL | Status: DC

## 2015-01-03 MED ORDER — GABAPENTIN 100 MG PO CAPS: 100.0000 mg | ORAL_CAPSULE | Freq: Every day | ORAL | Status: DC

## 2015-01-03 MED ORDER — TRAMADOL HCL 50 MG PO TABS: 50.0000 mg | ORAL_TABLET | Freq: Four times a day (QID) | ORAL | Status: DC | PRN
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
## 2015-01-03 NOTE — Patient Instructions (Signed)
May schedule SI injection sooner if needed.

## 2015-01-04 ENCOUNTER — Telehealth: Payer: Self-pay | Admitting: Obstetrics and Gynecology

## 2015-01-04 ENCOUNTER — Ambulatory Visit (INDEPENDENT_AMBULATORY_CARE_PROVIDER_SITE_OTHER): Payer: BLUE CROSS/BLUE SHIELD | Admitting: *Deleted

## 2015-01-04 VITALS — BP 126/80 | HR 96 | Resp 18 | Ht 65.0 in | Wt 249.0 lb

## 2015-01-04 DIAGNOSIS — Z3040 Encounter for surveillance of contraceptives, unspecified: Secondary | ICD-10-CM | POA: Diagnosis not present

## 2015-01-04 MED ORDER — MEDROXYPROGESTERONE ACETATE 150 MG/ML IM SUSP
150.0000 mg | Freq: Once | INTRAMUSCULAR | Status: AC
  Administered 2015-01-04: 150 mg via INTRAMUSCULAR

## 2015-01-04 MED ORDER — MEDROXYPROGESTERONE ACETATE 150 MG/ML IM SUSP: 150.0000 mg | Freq: Once | INTRAMUSCULAR | Status: DC

## 2015-01-04 NOTE — Progress Notes (Signed)
Patient in today for Depo Provera Injection.  Last Depo 10/18/14. Patient is on time today Last AEX 07/12/14  Patient tolerated shot well.  Patient states she still bleeding continuously, but it is not heavy. She states she still taking iron pills. Advised pt to schedule an OV with Dr. Edward JollySilva. Last Depo Dr. Edward JollySilva advised that patient needs OV. Patient states she was going to make appt for next week.   Routed to Dr. Edward JollySilva for review. -Encounter closed.

## 2015-01-04 NOTE — Telephone Encounter (Signed)
Called and left patient a message to call back to schedule an appointment with Dr. Edward JollySilva for abnormal bleeding per Kennon RoundsSally.

## 2015-01-07 NOTE — Progress Notes (Signed)
Encounter reviewed by Dr. Ruperto Kiernan Silva.  

## 2015-01-08 NOTE — Telephone Encounter (Signed)
Left message to call back  

## 2015-01-08 NOTE — Telephone Encounter (Signed)
Scheduled 01/09/15

## 2015-01-09 ENCOUNTER — Ambulatory Visit (INDEPENDENT_AMBULATORY_CARE_PROVIDER_SITE_OTHER): Payer: BLUE CROSS/BLUE SHIELD | Admitting: Obstetrics and Gynecology

## 2015-01-09 ENCOUNTER — Encounter: Payer: Self-pay | Admitting: Obstetrics and Gynecology

## 2015-01-09 VITALS — BP 128/82 | HR 88 | Resp 18 | Ht 65.0 in | Wt 248.0 lb

## 2015-01-09 DIAGNOSIS — N926 Irregular menstruation, unspecified: Secondary | ICD-10-CM

## 2015-01-09 NOTE — Progress Notes (Signed)
GYNECOLOGY  VISIT   HPI: 39 y.o.   Single  Caucasian  female   G1P1001 with No LMP recorded. Patient has had an injection.   here for   Abnormal bleeding with Depo.  This is third injection.  Bleeding since January.  Mucousy blood. This has now stopped since she received her Depo shot last week. Some pain in LLQ but it is less.   History of dysmenorrhea and menorrhagia.  Did not do well with Micronor.   Not sexually active since 2008.   Not sleeping well.  Was supposed to have labs done and her lithium level recheck with her psychiatrist, but has not done this.  She has the lab requisition for this in her car today.  GYNECOLOGIC HISTORY: No LMP recorded. Patient has had an injection. Contraception:  Depo Provera   Menopausal hormone therapy: none  Last Pap: 07/12/14 ASCUS. HR HPV:neg Last MMG: Never        OB History    Gravida Para Term Preterm AB TAB SAB Ectopic Multiple Living   1 1 1       1          Patient Active Problem List   Diagnosis Date Noted  . Sacroiliac dysfunction 08/14/2014  . Muscle pain, myofascial 08/14/2014  . Chronic back pain 05/25/2014  . Essential hypertension, benign 04/30/2014  . Menorrhagia 04/18/2014  . Dysmenorrhea 04/18/2014  . Ear pain 07/10/2013  . Migraine headache 07/10/2013  . Accelerated hypertension 02/27/2013  . Low back pain radiating to right leg 05/18/2012    Past Medical History  Diagnosis Date  . Hypertension   . Bipolar 1 disorder   . Anxiety   . Depression   . Dysmenorrhea     Past Surgical History  Procedure Laterality Date  . Shoulder arthroscopy Right 2007  . Knee arthroscopy Right 2003  . Kidney stone surgery  2007  . Wisdom tooth extraction  Highschool  . Knee arthroscopy Right 2007    Current Outpatient Prescriptions  Medication Sig Dispense Refill  . almotriptan (AXERT) 6.25 MG tablet Take 1 tablet (6.25 mg total) by mouth 2 (two) times daily as needed for migraine. may repeat in 2 hours if needed  20 tablet 0  . Biotin (BIOTIN 5000) 5 MG CAPS Take by mouth.    . busPIRone (BUSPAR) 10 MG tablet Take 10 mg by mouth 2 (two) times daily.    . Cholecalciferol (D3 DOTS) 2000 UNITS TBDP Take by mouth.    . cloNIDine (CATAPRES) 0.2 MG tablet Take 1 tablet (0.2 mg total) by mouth 2 (two) times daily. RAN OUT 60 tablet 7  . co-enzyme Q-10 50 MG capsule Take 50 mg by mouth daily.    . cyclobenzaprine (FLEXERIL) 10 MG tablet Take 1 tablet (10 mg total) by mouth 3 (three) times daily. 90 tablet 7  . FLUoxetine (PROZAC) 40 MG capsule Take 40 mg by mouth daily.    Marland Kitchen. gabapentin (NEURONTIN) 100 MG capsule Take 1 capsule (100 mg total) by mouth at bedtime. Take at bedtime 30 capsule 6  . lamoTRIgine (LAMICTAL) 150 MG tablet Take 150 mg by mouth. 2 tabs at bedtime    . lithium carbonate (ESKALITH) 450 MG CR tablet Take 2 tablets by mouth at bedtime.  0  . medroxyPROGESTERone (DEPO-PROVERA) 150 MG/ML injection Inject 150 mg into the muscle every 3 (three) months.    . Potassium Gluconate 595 MG CAPS Take by mouth.    . rizatriptan (MAXALT-MLT) 5 MG disintegrating  tablet     . traMADol (ULTRAM) 50 MG tablet Take 1 tablet (50 mg total) by mouth every 6 (six) hours as needed. 90 tablet 5  . zolpidem (AMBIEN) 10 MG tablet Take 10 mg by mouth at bedtime as needed for sleep.     No current facility-administered medications for this visit.     ALLERGIES: Clindamycin/lincomycin and Nubain  Family History  Problem Relation Age of Onset  . Addison's disease Mother   . Diabetes Mother   . Bipolar disorder Mother   . Fibroids Maternal Aunt     History   Social History  . Marital Status: Single    Spouse Name: N/A  . Number of Children: N/A  . Years of Education: N/A   Occupational History  . Not on file.   Social History Main Topics  . Smoking status: Former Games developer  . Smokeless tobacco: Never Used  . Alcohol Use: No  . Drug Use: No  . Sexual Activity:    Partners: Male    Birth Control/  Protection: None   Other Topics Concern  . 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  PHYSICAL EXAMINATION:    BP 128/82 mmHg  Pulse 88  Resp 18  Ht  (1.651 m)  Wt 248 lb (112.492 kg)  BMI 41.27 kg/m2     General appearance: cooperative and appears stated age.  Seems tired.   Pelvic: External genitalia:  no lesions              Urethra:  normal appearing urethra with no masses, tenderness or lesions              Bartholins and Skenes: normal                 Vagina: normal appearing vagina with normal color and discharge, no lesions              Cervix: normal appearance.  No blood noted.                   Bimanual Exam:  Uterus:  uterus is normal size, shape, consistency and nontender                                      Adnexa: normal adnexa in size, nontender and no masses                                      ASSESSMENT  Irregular menses on Depo Provera.  Now stopped since having injection last week.  Prior normal pelvic ultrasound.  Not sexually active.  History of bipolar.  Needs labs drawn for her psychiatrist.   PLAN  Hgb now - 12.3. Counseled regarding Depo Provera and normal side effect of irregular bleeding.  Continue Depo Provera.  Will do a course of Premarin .625 or Estrace 0.5 mg mg daily for 2 weeks if bleeding recurs.  Will go to lab upstairs and have her routine labs drawn now.  Return  for annual exam and prn.   An After Visit Summary was printed and given to the patient.  _15_____ minutes face to face time of which over 50% was spent in counseling.

## 2015-01-10 LAB — HEMOGLOBIN, FINGERSTICK: Hemoglobin, fingerstick: 12.3 g/dL (ref 12.0–16.0)

## 2015-01-11 ENCOUNTER — Telehealth: Payer: Self-pay | Admitting: *Deleted

## 2015-01-11 NOTE — Telephone Encounter (Signed)
Opened to follow-up with patient but she has already been in to see Dr Edward JollySilva on 01-09-15.  Routing to provider for final review. Patient agreeable to disposition. Will close encounter.

## 2015-01-31 ENCOUNTER — Ambulatory Visit (HOSPITAL_BASED_OUTPATIENT_CLINIC_OR_DEPARTMENT_OTHER): Payer: BLUE CROSS/BLUE SHIELD | Admitting: Physical Medicine & Rehabilitation

## 2015-01-31 ENCOUNTER — Encounter: Payer: Self-pay | Admitting: Physical Medicine & Rehabilitation

## 2015-01-31 ENCOUNTER — Encounter: Payer: BLUE CROSS/BLUE SHIELD | Attending: Physical Medicine & Rehabilitation

## 2015-01-31 VITALS — BP 149/88 | HR 99 | Resp 14

## 2015-01-31 DIAGNOSIS — M797 Fibromyalgia: Secondary | ICD-10-CM | POA: Diagnosis not present

## 2015-01-31 DIAGNOSIS — M545 Low back pain, unspecified: Secondary | ICD-10-CM

## 2015-01-31 DIAGNOSIS — M79604 Pain in right leg: Secondary | ICD-10-CM

## 2015-01-31 DIAGNOSIS — M7918 Myalgia, other site: Secondary | ICD-10-CM

## 2015-01-31 DIAGNOSIS — M533 Sacrococcygeal disorders, not elsewhere classified: Secondary | ICD-10-CM | POA: Diagnosis not present

## 2015-01-31 NOTE — Patient Instructions (Signed)
Back Exercises These exercises may help you when beginning to rehabilitate your injury. Your symptoms may resolve with or without further involvement from your physician, physical therapist or athletic trainer. While completing these exercises, remember:   Restoring tissue flexibility helps normal motion to return to the joints. This allows healthier, less painful movement and activity.  An effective stretch should be held for at least 30 seconds.  A stretch should never be painful. You should only feel a gentle lengthening or release in the stretched tissue. STRETCH - Extension, Prone on Elbows   Lie on your stomach on the floor, a bed will be too soft. Place your palms about shoulder width apart and at the height of your head.  Place your elbows under your shoulders. If this is too painful, stack pillows under your chest.  Allow your body to relax so that your hips drop lower and make contact more completely with the floor.  Hold this position for __________ seconds.  Slowly return to lying flat on the floor. Repeat __________ times. Complete this exercise __________ times per day.  RANGE OF MOTION - Extension, Prone Press Ups   Lie on your stomach on the floor, a bed will be too soft. Place your palms about shoulder width apart and at the height of your head.  Keeping your back as relaxed as possible, slowly straighten your elbows while keeping your hips on the floor. You may adjust the placement of your hands to maximize your comfort. As you gain motion, your hands will come more underneath your shoulders.  Hold this position __________ seconds.  Slowly return to lying flat on the floor. Repeat __________ times. Complete this exercise __________ times per day.  RANGE OF MOTION- Quadruped, Neutral Spine   Assume a hands and knees position on a firm surface. Keep your hands under your shoulders and your knees under your hips. You may place padding under your knees for  comfort.  Drop your head and point your tail bone toward the ground below you. This will round out your low back like an angry cat. Hold this position for __________ seconds.  Slowly lift your head and release your tail bone so that your back sags into a large arch, like an old horse.  Hold this position for __________ seconds.  Repeat this until you feel limber in your low back.  Now, find your "sweet spot." This will be the most comfortable position somewhere between the two previous positions. This is your neutral spine. Once you have found this position, tense your stomach muscles to support your low back.  Hold this position for __________ seconds. Repeat __________ times. Complete this exercise __________ times per day.  STRETCH - Flexion, Single Knee to Chest   Lie on a firm bed or floor with both legs extended in front of you.  Keeping one leg in contact with the floor, bring your opposite knee to your chest. Hold your leg in place by either grabbing behind your thigh or at your knee.  Pull until you feel a gentle stretch in your low back. Hold __________ seconds.  Slowly release your grasp and repeat the exercise with the opposite side. Repeat __________ times. Complete this exercise __________ times per day.  STRETCH - Hamstrings, Standing  Stand or sit and extend your right / left leg, placing your foot on a chair or foot stool  Keeping a slight arch in your low back and your hips straight forward.  Lead with your chest and   lean forward at the waist until you feel a gentle stretch in the back of your right / left knee or thigh. (When done correctly, this exercise requires leaning only a small distance.)  Hold this position for __________ seconds. Repeat __________ times. Complete this stretch __________ times per day. STRENGTHENING - Deep Abdominals, Pelvic Tilt   Lie on a firm bed or floor. Keeping your legs in front of you, bend your knees so they are both pointed  toward the ceiling and your feet are flat on the floor.  Tense your lower abdominal muscles to press your low back into the floor. This motion will rotate your pelvis so that your tail bone is scooping upwards rather than pointing at your feet or into the floor.  With a gentle tension and even breathing, hold this position for __________ seconds. Repeat __________ times. Complete this exercise __________ times per day.  STRENGTHENING - Abdominals, Crunches   Lie on a firm bed or floor. Keeping your legs in front of you, bend your knees so they are both pointed toward the ceiling and your feet are flat on the floor. Cross your arms over your chest.  Slightly tip your chin down without bending your neck.  Tense your abdominals and slowly lift your trunk high enough to just clear your shoulder blades. Lifting higher can put excessive stress on the low back and does not further strengthen your abdominal muscles.  Control your return to the starting position. Repeat __________ times. Complete this exercise __________ times per day.  STRENGTHENING - Quadruped, Opposite UE/LE Lift   Assume a hands and knees position on a firm surface. Keep your hands under your shoulders and your knees under your hips. You may place padding under your knees for comfort.  Find your neutral spine and gently tense your abdominal muscles so that you can maintain this position. Your shoulders and hips should form a rectangle that is parallel with the floor and is not twisted.  Keeping your trunk steady, lift your right hand no higher than your shoulder and then your left leg no higher than your hip. Make sure you are not holding your breath. Hold this position __________ seconds.  Continuing to keep your abdominal muscles tense and your back steady, slowly return to your starting position. Repeat with the opposite arm and leg. Repeat __________ times. Complete this exercise __________ times per day. Document Released:  09/25/2005 Document Revised: 11/30/2011 Document Reviewed: 12/20/2008 ExitCare Patient Information 2015 [employer]. This information is not intended to replace advice given to you by your health care provider. Make sure you discuss any questions you have with your health care provider.  

## 2015-01-31 NOTE — Progress Notes (Signed)
Subjective:    Patient ID: Maria Powers, female    DOB: 06/13/1976, 39 y.o.   MRN: [id number]  HPI States her pain is essentially unchanged in back.  Here for follow up.  Previous note said return in June for SI Pain is worse Reviewed meds-includes OTC checked interaction ibuprofen and lithium.OK , takes 600-800mg  qhs Also takes neurontin for shooting pain down RIght leg most days, this is also helpful Takes tramadol TID Cyclobenzaprine prn, averages 3-4 per day, Discussed this should be limited 3 times a day to potential interaction with Prozac as well as tramadol in terms of serotonin syndrome Pain Inventory Average Pain 4 Pain Right Now 5 My pain is sharp, stabbing and aching  In the last 24 hours, has pain interfered with the following? General activity 2 Relation with others 2 Enjoyment of life 2 What TIME of day is your pain at its worst? night Sleep (in general) Poor  Pain is worse with: no answer  Pain improves with: no answer Relief from Meds: 5  Mobility walk without assistance ability to climb steps?  yes do you drive?  yes  Function employed # of hrs/week 35-40  Neuro/Psych anxiety  Prior Studies Any changes since last visit?  no  Physicians involved in your care Any changes since last visit?  no   Family History  Problem Relation Age of Onset  . Addison's disease Mother   . Diabetes Mother   . Bipolar disorder Mother   . Fibroids Maternal Aunt    History   Social History  . Marital Status: Single    Spouse Name: N/A  . Number of Children: N/A  . Years of Education: N/A   Social History Main Topics  . Smoking status: Former Games developermoker  . Smokeless tobacco: Never Used  . Alcohol Use: No  . Drug Use: No  . Sexual Activity:    Partners: Male    Birth Control/ Protection: None   Other Topics Concern  . 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 Shoulder arthroscopy Right 2007  . Knee arthroscopy Right 2003  . Kidney stone surgery  2007  . Wisdom tooth extraction  Highschool  . Knee arthroscopy Right 2007   Past Medical History  Diagnosis Date  . Hypertension   . Bipolar 1 disorder   . Anxiety   . Depression   . Dysmenorrhea    BP 149/88 mmHg  Pulse 99  Resp 14  SpO2 98%  Opioid Risk Score:   Fall Risk Score: Low Fall Risk (0-5 points)`1  Depression screen PHQ 2/9  Dep[facility] screen [facility] 01/03/2015 02/27/2013  Decreased Interest 1 0  Down, Depressed, Hopeless 1 -  PHQ - 2 Score 2 0  Altered sleeping 3 -  Tired, decreased energy 2 -  Change in appetite 0 -  Feeling bad or failure about yourself  1 -  Trouble concentrating 1 -  Moving slowly or fidgety/restless 0 -  Suicidal thoughts 0 -  PHQ-9 Score 9 -      Review of Systems  Musculoskeletal: Positive for back pain.  Psychiatric/Behavioral: The patient is nervous/anxious.        Bipolar  All  other systems reviewed and are negative.      Objective:   Physical Exam  Constitutional: She is oriented to person, place, and time. She appears well-developed and well-nourished.  HENT:  Head: Normocephalic and atraumatic.  Neurological: She is alert and oriented to person, place, and time.  Psychiatric: She has a normal mood and affect.  Nursing note and vitals reviewed.  -SLR Full ROM flexion lumbar, 75% ext, and 50% lateral bending Sensation intact to pp in BLE      Assessment & Plan:  Treatment iliac pain is the primary pain generator, patient gets good temporary relief with sacroiliac injection, average duration about 2-3 months. Overdue for injection but work schedule is not allowing injection until June. In the meantime we'll continue medications as noted above  We'll also give some back exercises, after June  injection would recommend physical therapy.

## 2015-02-14 ENCOUNTER — Ambulatory Visit: Payer: BLUE CROSS/BLUE SHIELD | Admitting: Physical Medicine & Rehabilitation

## 2015-02-28 ENCOUNTER — Ambulatory Visit: Payer: BLUE CROSS/BLUE SHIELD | Admitting: Physical Medicine & Rehabilitation

## 2015-03-14 ENCOUNTER — Other Ambulatory Visit: Payer: Self-pay | Admitting: *Deleted

## 2015-03-14 ENCOUNTER — Encounter: Payer: BLUE CROSS/BLUE SHIELD | Attending: Physical Medicine & Rehabilitation

## 2015-03-14 ENCOUNTER — Ambulatory Visit (HOSPITAL_BASED_OUTPATIENT_CLINIC_OR_DEPARTMENT_OTHER): Payer: BLUE CROSS/BLUE SHIELD | Admitting: Physical Medicine & Rehabilitation

## 2015-03-14 ENCOUNTER — Encounter: Payer: Self-pay | Admitting: Physical Medicine & Rehabilitation

## 2015-03-14 VITALS — BP 136/90 | HR 84 | Resp 14

## 2015-03-14 DIAGNOSIS — M533 Sacrococcygeal disorders, not elsewhere classified: Secondary | ICD-10-CM

## 2015-03-14 DIAGNOSIS — M797 Fibromyalgia: Secondary | ICD-10-CM | POA: Insufficient documentation

## 2015-03-14 MED ORDER — GABAPENTIN 100 MG PO CAPS: 100.0000 mg | ORAL_CAPSULE | Freq: Two times a day (BID) | ORAL | Status: DC

## 2015-03-14 NOTE — Patient Instructions (Signed)
Sacroiliac injection was performed today. A combination of a naming medicine plus a cortisone medicine was injected. The injection was done under x-ray guidance. This procedure has been performed to help reduce low back and buttocks pain as well as potentially hip pain. The duration of this injection is variable lasting from hours to  Months. It may repeated if needed. 

## 2015-03-14 NOTE — Progress Notes (Signed)

## 2015-03-14 NOTE — Progress Notes (Signed)
  PROCEDURE RECORD  Physical Medicine and Rehabilitation   Name: Maria Powers DOB:12/29/1975 MRN: [id number]  Date:03/14/2015  Physician: Claudette Laws, MD    Nurse/CMA: Paylin Hailu   Allergies:  Allergies  Allergen Reactions  . Clindamycin/Lincomycin Nausea And Vomiting  . Nubain [Nalbuphine Hcl]     Consent Signed: Yes.    Is patient diabetic? No.  CBG today? .  Pregnant: No. LMP: No LMP recorded. Patient has had an injection. (age [age])  Anticoagulants: no Anti-inflammatory: no Antibiotics: no  Procedure: Sacroiliac ESI right  Position: Prone Start Time: 9:48  End Time: 9:53 Fluoro Time: 7  RN/CMA Penne Rosenstock  Jalyssa Fleisher     Time 9:18am 9:53am    BP 136/90 158/74    Pulse 84 92    Respirations 14 14    O2 Sat 99 100    S/S 6 6    Pain Level 4/10  3/10     D/C home with Friend, patient A & O X 3, D/C instructions reviewed, and sits independently.

## 2015-03-14 NOTE — Telephone Encounter (Signed)
E-prescribing error - resent Gabapentin 100 mg # 60  #RF6 increased to a tablet 2 times daily

## 2015-03-22 ENCOUNTER — Ambulatory Visit (INDEPENDENT_AMBULATORY_CARE_PROVIDER_SITE_OTHER): Payer: BLUE CROSS/BLUE SHIELD | Admitting: *Deleted

## 2015-03-22 VITALS — BP 118/70 | HR 88 | Resp 18 | Ht 65.0 in | Wt 248.0 lb

## 2015-03-22 DIAGNOSIS — Z3040 Encounter for surveillance of contraceptives, unspecified: Secondary | ICD-10-CM

## 2015-03-22 MED ORDER — MEDROXYPROGESTERONE ACETATE 150 MG/ML IM SUSP
150.0000 mg | Freq: Once | INTRAMUSCULAR | Status: AC
  Administered 2015-03-22: 150 mg via INTRAMUSCULAR

## 2015-03-22 NOTE — Progress Notes (Signed)
Patient is within Depo Provera Calender Limits 03/22/15-04/05/15 Next Depo Due between: 06/08/15-06/21/15 Last AEX: 07/12/14 with BS AEX Scheduled: AEX not schld  Patient is aware  Pt tolerated Injection well.   Patient states she started bleeding heavily 3 weeks ago.  Made 05/01/15 appt for her to see Dr. Edward [facility]Silva [facility] - per Dr. Rica RecordsSilva's recommendations.  Patient will call back if bleeding gets worse.

## 2015-03-22 NOTE — Progress Notes (Signed)
Encounter reviewed by Dr. Brook Amundson C. Silva.  

## 2015-04-21 ENCOUNTER — Other Ambulatory Visit: Payer: Self-pay | Admitting: Family Medicine

## 2015-05-01 ENCOUNTER — Encounter: Payer: Self-pay | Admitting: Obstetrics and Gynecology

## 2015-05-01 ENCOUNTER — Ambulatory Visit (INDEPENDENT_AMBULATORY_CARE_PROVIDER_SITE_OTHER): Payer: BLUE CROSS/BLUE SHIELD | Admitting: Obstetrics and Gynecology

## 2015-05-01 VITALS — BP 122/78 | HR 72 | Resp 14 | Wt 250.0 lb

## 2015-05-01 DIAGNOSIS — N921 Excessive and frequent menstruation with irregular cycle: Secondary | ICD-10-CM | POA: Diagnosis not present

## 2015-05-01 LAB — CBC
HCT: 35.2 % — ABNORMAL LOW (ref 36.0–46.0)
Hemoglobin: 11.5 g/dL — ABNORMAL LOW (ref 12.0–15.0)
MCH: 27.6 pg (ref 26.0–34.0)
MCHC: 32.7 g/dL (ref 30.0–36.0)
MCV: 84.4 fL (ref 78.0–100.0)
MPV: 9.8 fL (ref 8.6–12.4)
Platelets: 336 10*3/uL (ref 150–400)
RBC: 4.17 MIL/uL (ref 3.87–5.11)
RDW: 13.9 % (ref 11.5–15.5)
WBC: 8 10*3/uL (ref 4.0–10.5)

## 2015-05-01 NOTE — Progress Notes (Signed)
GYNECOLOGY  VISIT   HPI: 39 y.o.   Single  Caucasian  female   G1P1001 with Patient's last menstrual period was 03/01/2015 (exact date). here for to discuss birth control options.   Patient has hx of menorrhagia and dysmenorrhea.  On Depo Provera currently.  Has had break through bleeding on Depo Provera.  States her injections are on time.   LMP started 03/01/15 - until now.  Clots can push tampons out.  Had one episode where passed clots every 5 minutes. This lasted 2 days. Only occasional cramping.   Had normal pelvic ultrasound in August 2015.  EMS 13.79 mm.  No fibroids.  Normal ovaries.   TFTs normal 03/08/14.  Not sexually active since 2008.   Hx vaginal delivery.  Declines future childbearing.   Asking if I received her labs from her psychiatrist.  States she has a difficult time getting her records from them.   GYNECOLOGIC HISTORY: Patient's last menstrual period was 03/01/2015 (exact date). Contraception: Depo Provera Menopausal hormone therapy: n/a Last mammogram: n/a  Last pap smear: 07/12/14 ASCUS and neg HR HPV        OB History    Gravida Para Term Preterm AB TAB SAB Ectopic Multiple Living   1 1 1       1          Patient Active Problem List   Diagnosis Date Noted  . Sacroiliac dysfunction 08/14/2014  . Muscle pain, myofascial 08/14/2014  . Chronic back pain 05/25/2014  . Essential hypertension, benign 04/30/2014  . Menorrhagia 04/18/2014  . Dysmenorrhea 04/18/2014  . Ear pain 07/10/2013  . Migraine headache 07/10/2013  . Accelerated hypertension 02/27/2013  . Low back pain radiating to right leg 05/18/2012    Past Medical History  Diagnosis Date  . Hypertension   . Bipolar 1 disorder   . Anxiety   . Depression   . Dysmenorrhea     Past Surgical History  Procedure Laterality Date  . Shoulder arthroscopy Right 2007  . Knee arthroscopy Right 2003  . Kidney stone surgery  2007  . Wisdom tooth extraction  Highschool  . Knee arthroscopy  Right 2007    Current Outpatient Prescriptions  Medication Sig Dispense Refill  . almotriptan (AXERT) 6.25 MG tablet Take 1 tablet (6.25 mg total) by mouth 2 (two) times daily as needed for migraine. may repeat in 2 hours if needed 20 tablet 0  . Biotin (BIOTIN 5000) 5 MG CAPS Take by mouth.    . busPIRone (BUSPAR) 10 MG tablet Take 10 mg by mouth 2 (two) times daily.    . Cholecalciferol (D3 DOTS) 2000 UNITS TBDP Take by mouth.    . cloNIDine (CATAPRES) 0.2 MG tablet TAKE ONE TABLET BY MOUTH TWICE DAILY 60 tablet 0  . cyclobenzaprine (FLEXERIL) 10 MG tablet Take 1 tablet (10 mg total) by mouth 3 (three) times daily. 90 tablet 7  . FLUoxetine (PROZAC) 40 MG capsule Take 40 mg by mouth daily.    Marland Kitchen gabapentin (NEURONTIN) 100 MG capsule Take 1 capsule (100 mg total) by mouth 2 (two) times daily. 60 capsule 6  . lamoTRIgine (LAMICTAL) 150 MG tablet Take 150 mg by mouth. 2 tabs at bedtime    . lithium carbonate (ESKALITH) 450 MG CR tablet Take 2 tablets by mouth at bedtime.  0  . LORazepam (ATIVAN) 1 MG tablet Take 1 mg by mouth.  2  . medroxyPROGESTERone (DEPO-PROVERA) 150 MG/ML injection Inject 150 mg into the muscle every 3 (three)  months.    . Potassium Gluconate 595 MG CAPS Take by mouth.    . rizatriptan (MAXALT-MLT) 5 MG disintegrating tablet     . traMADol (ULTRAM) 50 MG tablet Take 1 tablet (50 mg total) by mouth every 6 (six) hours as needed. 90 tablet 5  . co-enzyme Q-10 50 MG capsule Take 50 mg by mouth daily.    Marland Kitchen zolpidem (AMBIEN) 10 MG tablet Take 10 mg by mouth at bedtime as needed for sleep.     No current facility-administered medications for this visit.     ALLERGIES: Clindamycin/lincomycin and Nubain  Family History  Problem Relation Age of Onset  . Addison's disease Mother   . Diabetes Mother   . Bipolar disorder Mother   . Fibroids Maternal Aunt     Social History   Social History  . Marital Status: Single    Spouse Name: N/A  . Number of Children: N/A  .  Years of Education: N/A   Occupational History  . Not on file.   Social History Main Topics  . Smoking status: Former Games developer  . Smokeless tobacco: Never Used  . Alcohol Use: No  . Drug Use: No  . Sexual Activity:    Partners: Male    Birth Control/ Protection: None   Other Topics Concern  . 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  PHYSICAL EXAMINATION:    BP 122/78 mmHg  Pulse 72  Resp 14  Wt 250 lb (113.399 kg)  LMP 03/01/2015 (Exact Date)    General appearance: alert, cooperative and appears stated age  ASSESSMENT  Menorrhagia with irregular cycles.  Normal pelvic ultrasound.  Hx of Depo Provera use.  Hx migraines and HTN. Hx bipolar.  No labs received with recent lithium levels.  PLAN  Counseled regarding menorrhagia with irregular cycles.  Discussed options for treatment - Mirena IUD, hysteroscopy/D & C/endometrial ablation/hysterectomy - risks and benefits of each option.  Handout on Mirena IUD to patient.  Patient would like to precert.  Will do CBC today.  Patient will get a copy of her labs from psychiatry and send to me.   An After Visit Summary was printed and given to the patient.  __15____ minutes face to face time of which over 50% was spent in counseling.   Addendum after visit - No prior EMB.  Will proceed with this prior to making final decision regarding Mirena IUD.

## 2015-05-02 ENCOUNTER — Telehealth: Payer: Self-pay

## 2015-05-02 DIAGNOSIS — N921 Excessive and frequent menstruation with irregular cycle: Secondary | ICD-10-CM

## 2015-05-02 NOTE — Telephone Encounter (Signed)
Left message to call Maria Powers at [phone]. 

## 2015-05-02 NOTE — Telephone Encounter (Signed)
-----   Message from Patton Salles, MD sent at 05/02/2015 10:58 AM EDT ----- Please contact patient about her Hgb showing mild anemia.  She may take a multivitamin with iron daily for this.   I reviewed her chart further.  I would like for her to proceed with an EMB prior to making the final plan for the Mirena IUD.  I have not discussed this with her but I think that it is needed as she has failed 2 forms of medical therapy already for her bleeding.  Please give her explanation and usual planning precautions.  She is not sexually active.   She has already had a pelvic ultrasound and does not need this.   The EMB will need precert.  Thank you.  Cc- Claudette Laws

## 2015-05-03 NOTE — Telephone Encounter (Signed)
Spoke with patient. Advised of results as seen below from Dr.Silva. Patient is agreeable and verbalizes understanding. States she already takes a daily multivitamin with 325 mg of iron daily. Advised will speak with Dr.Silva and return call with any further recommendations regarding daily iron. Appointment scheduled for EMB on 05/17/2015 at 10am with Dr.Silva. Patient is agreeable. Description of procedure given. All questions answered. Advised will need to take 800 mg of ibuprofen/motrin one hour before her appointment to help reduce any cramping with biopsy. Patient is agreeable. Order placed for precert.  Dr.Silva, how much do you suggest patient increase her iron by daily with her taking a daily multivitamin with 325 mg of iron?

## 2015-05-03 NOTE — Telephone Encounter (Signed)
Take her MVI with orange juice to improve absorption of the iron.  Eat leafy green vegetables and red meat.   We can check her iron level at her visit for the endometrial biopsy.  Any further recommendations to follow after that.

## 2015-05-03 NOTE — Telephone Encounter (Signed)
Spoke with patient. Advised of message as seen below from Dr.Silva. Patient is agreeable and verbalizes understanding.  Routing to provider for final review. Patient agreeable to disposition. Will close encounter.  

## 2015-05-14 ENCOUNTER — Telehealth: Payer: Self-pay | Admitting: Obstetrics and Gynecology

## 2015-05-14 NOTE — Telephone Encounter (Signed)
Called patient to review benefits for procedure. Left voicemail to call back and review. °

## 2015-05-15 NOTE — Telephone Encounter (Signed)
Patient returned call. Reviewed benefits. Patient understands and agreeable. Verified arrival date/time for appointment. Patient agreeable. Ok to close

## 2015-05-17 ENCOUNTER — Encounter: Payer: Self-pay | Admitting: Obstetrics and Gynecology

## 2015-05-17 ENCOUNTER — Telehealth: Payer: Self-pay | Admitting: Obstetrics and Gynecology

## 2015-05-17 ENCOUNTER — Ambulatory Visit (INDEPENDENT_AMBULATORY_CARE_PROVIDER_SITE_OTHER): Payer: BLUE CROSS/BLUE SHIELD | Admitting: Obstetrics and Gynecology

## 2015-05-17 VITALS — BP 130/84 | HR 88 | Ht 65.0 in | Wt 252.6 lb

## 2015-05-17 DIAGNOSIS — D649 Anemia, unspecified: Secondary | ICD-10-CM | POA: Diagnosis not present

## 2015-05-17 DIAGNOSIS — N921 Excessive and frequent menstruation with irregular cycle: Secondary | ICD-10-CM | POA: Diagnosis not present

## 2015-05-17 NOTE — Telephone Encounter (Signed)
Call to patient to review benefits for procedure. Patient is agreeable. Patient aware need to schedule IUD insertion after EMB results back. Patient will call back to schedule.   Routing to provider for final review.   Encounter closed.

## 2015-05-17 NOTE — Patient Instructions (Signed)
Endometrial Biopsy Endometrial biopsy is a procedure in which a tissue sample is taken from inside the uterus. The tissue sample is then looked at under a microscope to see if the tissue is normal or abnormal. The endometrium is the lining of the uterus. This procedure helps determine where you are in your menstrual cycle and how hormone levels are affecting the lining of the uterus. This procedure may also be used to evaluate uterine bleeding or to diagnose endometrial cancer, tuberculosis, polyps, or inflammatory conditions.  LET YOUR HEALTH CARE PROVIDER KNOW ABOUT:  Any allergies you have.  All medicines you are taking, including vitamins, herbs, eye drops, creams, and over-the-counter medicines.  Previous problems you or members of your family have had with the use of anesthetics.  Any blood disorders you have.  Previous surgeries you have had.  Medical conditions you have.  Possibility of pregnancy. RISKS AND COMPLICATIONS Generally, this is a safe procedure. However, as with any procedure, complications can occur. Possible complications include:  Bleeding.  Pelvic infection.  Puncture of the uterine wall with the biopsy device (rare). BEFORE THE PROCEDURE   Keep a record of your menstrual cycles as directed by your health care provider. You may need to schedule your procedure for a specific time in your cycle.  You may want to bring a sanitary pad to wear home after the procedure.  Arrange for someone to drive you home after the procedure if you will be given a medicine to help you relax (sedative). PROCEDURE   You may be given a sedative to relax you.  You will lie on an exam table with your feet and legs supported as in a pelvic exam.  Your health care provider will insert an instrument (speculum) into your vagina to see your cervix.  Your cervix will be cleansed with an antiseptic solution. A medicine (local anesthetic) will be used to numb the cervix.  A forceps  instrument (tenaculum) will be used to hold your cervix steady for the biopsy.  A thin, rodlike instrument (uterine sound) will be inserted through your cervix to determine the length of your uterus and the location where the biopsy sample will be removed.  A thin, flexible tube (catheter) will be inserted through your cervix and into the uterus. The catheter is used to collect the biopsy sample from your endometrial tissue.  The catheter and speculum will then be removed, and the tissue sample will be sent to a lab for examination. AFTER THE PROCEDURE  You will rest in a recovery area until you are ready to go home.  You may have mild cramping and a small amount of vaginal bleeding for a few days after the procedure. This is normal.  Make sure you find out how to get your test results. Document Released: 01/08/2005 Document Revised: 05/10/2013 Document Reviewed: 02/22/2013 ExitCare Patient Information 2015 [employer]. This information is not intended to replace advice given to you by your health care provider. Make sure you discuss any questions you have with your health care provider.  

## 2015-05-17 NOTE — Progress Notes (Signed)
Patient ID: Maria Powers, female   DOB: 1976-01-05, 39 y.o.   MRN: [id number] GYNECOLOGY  VISIT   HPI: 39 y.o.   Single  Caucasian  female   G1P1001 with Patient's last menstrual period was 03/01/2015 (exact date).   here for endometrial biopsy.   Would like to have Mirena IUD.   Patient has hx of menorrhagia and dysmenorrhea.  On Depo Provera currently. Last injection 03/22/15.  Has had break through bleeding on Depo Provera.  States her injections are on time.   LMP started 03/01/15 - until now.  Clots can push tampons out.  Had one episode where passed clots every 5 minutes. This lasted 2 days. Only occasional cramping.   Had normal pelvic ultrasound in August 2015.  EMS 13.79 mm.  No fibroids.  Normal ovaries.   TFTs normal 03/08/14.  Not sexually active since 2008.   Hx vaginal delivery.  Declines future childbearing.   Hgb 11.5 two weeks ago.   GYNECOLOGIC HISTORY: Patient's last menstrual period was 03/01/2015 (exact date). Contraception: Abstinence/Depo Provera injection Menopausal hormone therapy: none Last mammogram: never Last pap smear: 07-12-14 [facility]:Neg HR HPV        OB History    Gravida Para Term Preterm AB TAB SAB Ectopic Multiple Living   1 1 1       1          Patient Active Problem List   Diagnosis Date Noted  . Sacroiliac dysfunction 08/14/2014  . Muscle pain, myofascial 08/14/2014  . Chronic back pain 05/25/2014  . Essential hypertension, benign 04/30/2014  . Menorrhagia 04/18/2014  . Dysmenorrhea 04/18/2014  . Ear pain 07/10/2013  . Migraine headache 07/10/2013  . Accelerated hypertension 02/27/2013  . Low back pain radiating to right leg 05/18/2012    Past Medical History  Diagnosis Date  . Hypertension   . Bipolar 1 disorder   . Anxiety   . Depression   . Dysmenorrhea     Past Surgical History  Procedure Laterality Date  . Shoulder arthroscopy Right 2007  . Knee arthroscopy Right 2003  . Kidney stone surgery  2007   . Wisdom tooth extraction  Highschool  . Knee arthroscopy Right 2007    Current Outpatient Prescriptions  Medication Sig Dispense Refill  . almotriptan (AXERT) 6.25 MG tablet Take 1 tablet (6.25 mg total) by mouth 2 (two) times daily as needed for migraine. may repeat in 2 hours if needed 20 tablet 0  . busPIRone (BUSPAR) 10 MG tablet Take 10 mg by mouth 2 (two) times daily.    . Cholecalciferol (D3 DOTS) 2000 UNITS TBDP Take by mouth.    . cloNIDine (CATAPRES) 0.2 MG tablet TAKE ONE TABLET BY MOUTH TWICE DAILY 60 tablet 0  . cyclobenzaprine (FLEXERIL) 10 MG tablet Take 1 tablet (10 mg total) by mouth 3 (three) times daily. 90 tablet 7  . ferrous sulfate 325 (65 FE) MG tablet Take 325 mg by mouth daily with breakfast.    . FLUoxetine (PROZAC) 40 MG capsule Take 40 mg by mouth daily.    Marland Kitchen gabapentin (NEURONTIN) 100 MG capsule Take 1 capsule (100 mg total) by mouth 2 (two) times daily. 60 capsule 6  . lamoTRIgine (LAMICTAL) 150 MG tablet Take 150 mg by mouth. 2 tabs at bedtime    . lithium carbonate (ESKALITH) 450 MG CR tablet Take 2 tablets by mouth at bedtime.  0  . LORazepam (ATIVAN) 1 MG tablet Take 1 mg by mouth.  2  . Magnesium  Citrate 100 MG TABS Take by mouth. Takes 2 tablets at night.    . medroxyPROGESTERone (DEPO-PROVERA) 150 MG/ML injection Inject 150 mg into the muscle every 3 (three) months.    . Potassium Gluconate 595 MG CAPS Take by mouth.    . rizatriptan (MAXALT-MLT) 5 MG disintegrating tablet     . traMADol (ULTRAM) 50 MG tablet Take 1 tablet (50 mg total) by mouth every 6 (six) hours as needed. 90 tablet 5   No current facility-administered medications for this visit.     ALLERGIES: Clindamycin/lincomycin and Nubain  Family History  Problem Relation Age of Onset  . Addison's disease Mother   . Diabetes Mother   . Bipolar disorder Mother   . Fibroids Maternal Aunt     Social History   Social History  . Marital Status: Single    Spouse Name: N/A  . Number  of Children: N/A  . Years of Education: N/A   Occupational History  . Not on file.   Social History Main Topics  . Smoking status: Former Games developer  . Smokeless tobacco: Never Used  . Alcohol Use: No  . Drug Use: No  . Sexual Activity:    Partners: Male    Birth Control/ Protection: Abstinence, Injection     Comment: Depo Provera injection   Other Topics Concern  . 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  PHYSICAL EXAMINATION:    BP 130/84 mmHg  Pulse 88  Ht  (1.651 m)  Wt 252 lb 9.6 oz (114.579 kg)  BMI 42.03 kg/m2  LMP 03/01/2015 (Exact Date)    General appearance: alert, cooperative and appears stated age    Procedure - endometrial biopsy Consent performed. Speculum place in vagina.  Sterile prep of cervix with  Hibiclens Tenaculum to anterior cervical lip. Paracervical block with 10 cc 1% lidocaine _______________ yes.  Lot number [id number], expiration 09/2015 Pipelle placed to      9    cm without difficulty twice. Tissue obtained and sent to pathology. Speculum removed.  No complications. Minimal EBL.  Chaperone was present for exam.  ASSESSMENT  Menometrorrhagia on Depo Provera.  Potential candidate for Mirena.   PLAN  Follow up biopsy.  Instructions and precautions given.  Will precert Mirena after EMB is back.  Already has handout on Mirena.  Recheck CBC today.    An After Visit Summary was printed and given to the patient.

## 2015-05-18 LAB — CBC
HEMATOCRIT: 38.1 % (ref 36.0–46.0)
Hemoglobin: 11.7 g/dL — ABNORMAL LOW (ref 12.0–15.0)
MCH: 26.7 pg (ref 26.0–34.0)
MCHC: 30.7 g/dL (ref 30.0–36.0)
MCV: 86.8 fL (ref 78.0–100.0)
MPV: 10.1 fL (ref 8.6–12.4)
Platelets: 300 10*3/uL (ref 150–400)
RBC: 4.39 MIL/uL (ref 3.87–5.11)
RDW: 13.2 % (ref 11.5–15.5)
WBC: 8 10*3/uL (ref 4.0–10.5)

## 2015-05-20 ENCOUNTER — Other Ambulatory Visit: Payer: Self-pay | Admitting: Family Medicine

## 2015-05-21 LAB — IPS OTHER TISSUE BIOPSY

## 2015-05-23 ENCOUNTER — Telehealth: Payer: Self-pay | Admitting: Emergency Medicine

## 2015-05-23 NOTE — Telephone Encounter (Signed)
Message left to return call to Maria Powers at [phone].    

## 2015-05-23 NOTE — Telephone Encounter (Signed)
She does not need to wait for a cycle for Mirena IUD insertion.  This will need to be done before she is due for her next depo provera injection.  Thanks.

## 2015-05-23 NOTE — Telephone Encounter (Signed)
Message left to return call to [place] at [phone].   Patient aware of IUD pre-certification.  G1P1.  Current on Depo Provera.   Dr. Edward Jolly,  Does patient need to wait until has a cycle to have Mirena placed?

## 2015-05-23 NOTE — Telephone Encounter (Signed)
Spoke with patient and message from Dr. Edward Jolly given.  Patient would like to schedule Mirena IUD insertion on 06/21/15 (this is the last day to get Depo shot as well.) Patient aware that she must have IUD placed no later than 06/21/15. Patient states she is not sexually active.  Pre-procedure instructions given.  Motrin 800 mg PO one hour before with food.  Patient agreeable.  Routing to provider for final review. Patient agreeable to disposition. Will close encounter.

## 2015-05-23 NOTE — Telephone Encounter (Signed)
Returning a call to Tracy °

## 2015-05-23 NOTE — Telephone Encounter (Signed)
-----   Message from Patton Salles, MD sent at 05/23/2015  6:25 AM EDT ----- Please report EMB showing benign endometrium and progestation effect.  No hyperplasia or malignancy seen.   OK to proceed with Mirena IUD insertion with me. I am not certain of the status of her precert on this.

## 2015-06-17 ENCOUNTER — Other Ambulatory Visit: Payer: Self-pay | Admitting: Family Medicine

## 2015-06-18 ENCOUNTER — Encounter: Payer: Self-pay | Admitting: Physical Medicine & Rehabilitation

## 2015-06-18 ENCOUNTER — Ambulatory Visit (HOSPITAL_BASED_OUTPATIENT_CLINIC_OR_DEPARTMENT_OTHER): Payer: BLUE CROSS/BLUE SHIELD | Admitting: Physical Medicine & Rehabilitation

## 2015-06-18 ENCOUNTER — Encounter: Payer: BLUE CROSS/BLUE SHIELD | Attending: Physical Medicine & Rehabilitation

## 2015-06-18 VITALS — BP 144/81 | HR 89

## 2015-06-18 DIAGNOSIS — M533 Sacrococcygeal disorders, not elsewhere classified: Secondary | ICD-10-CM | POA: Insufficient documentation

## 2015-06-18 DIAGNOSIS — M25561 Pain in right knee: Secondary | ICD-10-CM

## 2015-06-18 DIAGNOSIS — M545 Low back pain: Secondary | ICD-10-CM | POA: Insufficient documentation

## 2015-06-18 MED ORDER — TRAMADOL HCL 50 MG PO TABS: 50.0000 mg | ORAL_TABLET | Freq: Four times a day (QID) | ORAL | Status: DC | PRN

## 2015-06-18 NOTE — Patient Instructions (Signed)
Back Exercises These exercises may help you when beginning to rehabilitate your injury. Your symptoms may resolve with or without further involvement from your physician, physical therapist or athletic trainer. While completing these exercises, remember:   Restoring tissue flexibility helps normal motion to return to the joints. This allows healthier, less painful movement and activity.  An effective stretch should be held for at least 30 seconds.  A stretch should never be painful. You should only feel a gentle lengthening or release in the stretched tissue. STRETCH - Extension, Prone on Elbows   Lie on your stomach on the floor, a bed will be too soft. Place your palms about shoulder width apart and at the height of your head.  Place your elbows under your shoulders. If this is too painful, stack pillows under your chest.  Allow your body to relax so that your hips drop lower and make contact more completely with the floor.  Hold this position for __________ seconds.  Slowly return to lying flat on the floor. Repeat __________ times. Complete this exercise __________ times per day.  RANGE OF MOTION - Extension, Prone Press Ups   Lie on your stomach on the floor, a bed will be too soft. Place your palms about shoulder width apart and at the height of your head.  Keeping your back as relaxed as possible, slowly straighten your elbows while keeping your hips on the floor. You may adjust the placement of your hands to maximize your comfort. As you gain motion, your hands will come more underneath your shoulders.  Hold this position __________ seconds.  Slowly return to lying flat on the floor. Repeat __________ times. Complete this exercise __________ times per day.  RANGE OF MOTION- Quadruped, Neutral Spine   Assume a hands and knees position on a firm surface. Keep your hands under your shoulders and your knees under your hips. You may place padding under your knees for  comfort.  Drop your head and point your tail bone toward the ground below you. This will round out your low back like an angry cat. Hold this position for __________ seconds.  Slowly lift your head and release your tail bone so that your back sags into a large arch, like an old horse.  Hold this position for __________ seconds.  Repeat this until you feel limber in your low back.  Now, find your "sweet spot." This will be the most comfortable position somewhere between the two previous positions. This is your neutral spine. Once you have found this position, tense your stomach muscles to support your low back.  Hold this position for __________ seconds. Repeat __________ times. Complete this exercise __________ times per day.  STRETCH - Flexion, Single Knee to Chest   Lie on a firm bed or floor with both legs extended in front of you.  Keeping one leg in contact with the floor, bring your opposite knee to your chest. Hold your leg in place by either grabbing behind your thigh or at your knee.  Pull until you feel a gentle stretch in your low back. Hold __________ seconds.  Slowly release your grasp and repeat the exercise with the opposite side. Repeat __________ times. Complete this exercise __________ times per day.  STRETCH - Hamstrings, Standing  Stand or sit and extend your right / left leg, placing your foot on a chair or foot stool  Keeping a slight arch in your low back and your hips straight forward.  Lead with your chest and   lean forward at the waist until you feel a gentle stretch in the back of your right / left knee or thigh. (When done correctly, this exercise requires leaning only a small distance.)  Hold this position for __________ seconds. Repeat __________ times. Complete this stretch __________ times per day. STRENGTHENING - Deep Abdominals, Pelvic Tilt   Lie on a firm bed or floor. Keeping your legs in front of you, bend your knees so they are both pointed  toward the ceiling and your feet are flat on the floor.  Tense your lower abdominal muscles to press your low back into the floor. This motion will rotate your pelvis so that your tail bone is scooping upwards rather than pointing at your feet or into the floor.  With a gentle tension and even breathing, hold this position for __________ seconds. Repeat __________ times. Complete this exercise __________ times per day.  STRENGTHENING - Abdominals, Crunches   Lie on a firm bed or floor. Keeping your legs in front of you, bend your knees so they are both pointed toward the ceiling and your feet are flat on the floor. Cross your arms over your chest.  Slightly tip your chin down without bending your neck.  Tense your abdominals and slowly lift your trunk high enough to just clear your shoulder blades. Lifting higher can put excessive stress on the low back and does not further strengthen your abdominal muscles.  Control your return to the starting position. Repeat __________ times. Complete this exercise __________ times per day.  STRENGTHENING - Quadruped, Opposite UE/LE Lift   Assume a hands and knees position on a firm surface. Keep your hands under your shoulders and your knees under your hips. You may place padding under your knees for comfort.  Find your neutral spine and gently tense your abdominal muscles so that you can maintain this position. Your shoulders and hips should form a rectangle that is parallel with the floor and is not twisted.  Keeping your trunk steady, lift your right hand no higher than your shoulder and then your left leg no higher than your hip. Make sure you are not holding your breath. Hold this position __________ seconds.  Continuing to keep your abdominal muscles tense and your back steady, slowly return to your starting position. Repeat with the opposite arm and leg. Repeat __________ times. Complete this exercise __________ times per day. Document Released:  09/25/2005 Document Revised: 11/30/2011 Document Reviewed: 12/20/2008 ExitCare Patient Information 2015 [employer]. This information is not intended to replace advice given to you by your health care provider. Make sure you discuss any questions you have with your health care provider.  

## 2015-06-18 NOTE — Progress Notes (Signed)
  PROCEDURE RECORD [facility]   Name: Maria Powers DOB:1976/05/04 MRN: [id number]  Date:06/18/2015  Physician: Claudette Laws, MD    Nurse/CMA: Troutman CMA/ Shumaker RN  Allergies:  Allergies  Allergen Reactions  . Clindamycin/Lincomycin Nausea And Vomiting  . Nubain [Nalbuphine Hcl] Other (See Comments)    *feels like something crawling on her*    Consent Signed: No.  Is patient diabetic? No.  CBG today?   Pregnant: No. LMP: No LMP recorded. Patient has had an injection. (age 17-55)  Anticoagulants: no Anti-inflammatory: no Antibiotics: no  Procedure: right sacroiliac steroid injection Position: Prone Start Time:   End Time:   Fluoro Time:   RN/CMA Copy CMA    Time 1:10     BP 144/81     Pulse 89     Respirations 16     O2 Sat 98     S/S 6     Pain Level 6/10      D/C home with Harriett Sine, patient A & O X 3, D/C instructions reviewed, and sits independently.

## 2015-06-18 NOTE — Progress Notes (Signed)
Finish results for vitals: BP: 157/94 Pulse: 114 O2: 100%  Pain level leaving was a 5

## 2015-06-18 NOTE — Progress Notes (Signed)

## 2015-06-21 ENCOUNTER — Ambulatory Visit (INDEPENDENT_AMBULATORY_CARE_PROVIDER_SITE_OTHER): Payer: BLUE CROSS/BLUE SHIELD | Admitting: Obstetrics and Gynecology

## 2015-06-21 ENCOUNTER — Encounter: Payer: Self-pay | Admitting: Obstetrics and Gynecology

## 2015-06-21 VITALS — BP 146/86 | HR 100 | Resp 20 | Ht 65.0 in | Wt 254.0 lb

## 2015-06-21 DIAGNOSIS — Z30430 Encounter for insertion of intrauterine contraceptive device: Secondary | ICD-10-CM

## 2015-06-21 DIAGNOSIS — N921 Excessive and frequent menstruation with irregular cycle: Secondary | ICD-10-CM

## 2015-06-21 NOTE — Progress Notes (Signed)
GYNECOLOGY  VISIT   HPI: 39 y.o.   Single  Caucasian  female   G1P1001 with No LMP recorded. Patient has had an injection.   here for Mirena IUD Insertion . LMP was 03/01/15 - ended 2.5 weeks ago.  No chance of pregnancy.  Not sexually active.   Bleeding on Depo Provera.  Normal pelvic ultrasound and negative EMB.  GYNECOLOGIC HISTORY: No LMP recorded. Patient has had an injection. Contraception: Depo Provera  Menopausal hormone therapy: None Last mammogram: never Last pap smear: 07/12/14 ASCUS. HR HPV:Neg        OB History    Gravida Para Term Preterm AB TAB SAB Ectopic Multiple Living   Patient Active Problem List   Diagnosis Date Noted  . Sacroiliac dysfunction 08/14/2014  . Muscle pain, myofascial 08/14/2014  . Chronic back pain 05/25/2014  . Essential hypertension, benign 04/30/2014  . Menorrhagia 04/18/2014  . Dysmenorrhea 04/18/2014  . Ear pain 07/10/2013  . Migraine headache 07/10/2013  . Accelerated hypertension 02/27/2013  . Low back pain radiating to right leg 05/18/2012    Past Medical History  Diagnosis Date  . Hypertension   . Bipolar 1 disorder   . Anxiety   . Depression   . Dysmenorrhea     Past Surgical History  Procedure Laterality Date  . Shoulder arthroscopy Right 2007  . Knee arthroscopy Right 2003  . Kidney stone surgery  2007  . Wisdom tooth extraction  Highschool  . Knee arthroscopy Right 2007    Current Outpatient Prescriptions  Medication Sig Dispense Refill  . almotriptan (AXERT) 6.25 MG tablet Take 1 tablet (6.25 mg total) by mouth 2 (two) times daily as needed for migraine. may repeat in 2 hours if needed 20 tablet 0  . busPIRone (BUSPAR) 10 MG tablet Take 10 mg by mouth 2 (two) times daily.    . Cholecalciferol (D3 DOTS) 2000 UNITS TBDP Take by mouth.    . cloNIDine (CATAPRES) 0.2 MG tablet TAKE ONE TABLET BY MOUTH TWICE DAILY 60 tablet 0  . cyclobenzaprine (FLEXERIL) 10 MG tablet Take 1 tablet (10 mg  total) by mouth 3 (three) times daily. 90 tablet 7  . ferrous sulfate 325 (65 FE) MG tablet Take 325 mg by mouth daily with breakfast.    . FLUoxetine (PROZAC) 40 MG capsule Take 40 mg by mouth daily.    Marland Kitchen gabapentin (NEURONTIN) 100 MG capsule Take 1 capsule (100 mg total) by mouth 2 (two) times daily. 60 capsule 6  . hydrOXYzine (ATARAX/VISTARIL) 50 MG tablet Take 100 mg by mouth at bedtime as needed.   0  . lamoTRIgine (LAMICTAL) 150 MG tablet Take 150 mg by mouth. 2 tabs at bedtime    . lithium carbonate 300 MG capsule Take 300 mg by mouth 2 (two) times daily with a meal.   1  . Magnesium Citrate 100 MG TABS Take by mouth. Takes 2 tablets at night.    . Potassium Gluconate 595 MG CAPS Take by mouth.    . rizatriptan (MAXALT-MLT) 5 MG disintegrating tablet Take 5 mg by mouth as needed.     . traMADol (ULTRAM) 50 MG tablet Take 1 tablet (50 mg total) by mouth every 6 (six) hours as needed. 90 tablet 5   No current facility-administered medications for this visit.     ALLERGIES: Clindamycin/lincomycin and Nubain  Family History  Problem Relation Age of  Onset  . Addison's disease Mother   . Diabetes Mother   . Bipolar disorder Mother   . Fibroids Maternal Aunt     Social History   Social History  . Marital Status: Single    Spouse Name: N/A  . Number of Children: N/A  . Years of Education: N/A   Occupational History  . Not on file.   Social History Main Topics  . Smoking status: Former Games developer  . Smokeless tobacco: Never Used  . Alcohol Use: No  . Drug Use: No  . Sexual Activity:    Partners: Male    Birth Control/ Protection: Abstinence, Injection     Comment: Depo Provera injection   Other Topics Concern  . Not on file   Social History Narrative   Work or School: Pharmacist, hospital      Home Situation: roommate      Spiritual Beliefs:mormon      Lifestyle: no regular exercise; horrible diet             ROS:  Pertinent items are noted in  HPI.  PHYSICAL EXAMINATION:    BP 146/86 mmHg  Pulse 100  Resp 20  Ht  (1.651 m)  Wt 254 lb (115.214 kg)  BMI 42.27 kg/m2    General appearance: alert, cooperative and appears stated age    Pelvic: External genitalia:  no lesions              Urethra:  normal appearing urethra with no masses, tenderness or lesions              Bartholins and Skenes: normal                 Vagina: normal appearing vagina with normal color and discharge, no lesions              Cervix: no lesions          Bimanual Exam:  Uterus:  normal size, contour, position, consistency, mobility, non-tender              Adnexa: normal adnexa and no mass, fullness, tenderness                Mirena IUD insertion. Lot [id number], expiration 02/19. Consent for procedure.  Speculum placed in vagina.  Sterile prep with betadine.  Paracervical block with 10 cc 1% lidocaine.  Lot [id number], expiration 09/22/15. Tenaculum to anterior cervical lip.  Uterus sounded to 9 cm.  Mirena placed without difficulty.  Strings trimmed.  No complications.  Minimal EBL. Chaperone was present for exam.  ASSESSMENT  Mirena IUD placement.   PLAN  Counseled regarding Mirena IUD.  Instructions and precautions given.  Follow up in 5 weeks.   An After Visit Summary was printed and given to the patient.

## 2015-06-21 NOTE — Patient Instructions (Signed)
Intrauterine Device Insertion, Care After Refer to this sheet in the next few weeks. These instructions provide you with information on caring for yourself after your procedure. Your health care Aaronjames Kelsay may also give you more specific instructions. Your treatment has been planned according to current medical practices, but problems sometimes occur. Call your health care Rodrick Payson if you have any problems or questions after your procedure. WHAT TO EXPECT AFTER THE PROCEDURE Insertion of the IUD may cause some discomfort, such as cramping. The cramping should improve after the IUD is in place. You may have bleeding after the procedure. This is normal. It varies from light spotting for a few days to menstrual-like bleeding. When the IUD is in place, a string will extend past the cervix into the vagina for 1-2 inches. The strings should not bother you or your partner. If they do, talk to your health care Rayhaan Huster.  HOME CARE INSTRUCTIONS   Check your intrauterine device (IUD) to make sure it is in place before you resume sexual activity. You should be able to feel the strings. If you cannot feel the strings, something may be wrong. The IUD may have fallen out of the uterus, or the uterus may have been punctured (perforated) during placement. Also, if the strings are getting longer, it may mean that the IUD is being forced out of the uterus. You no longer have full protection from pregnancy if any of these problems occur.  You may resume sexual intercourse if you are not having problems with the IUD. The copper IUD is considered immediately effective, and the hormone IUD works right away if inserted within 7 days of your period starting. You will need to use a backup method of birth control for 7 days if the IUD in inserted at any other time in your cycle.  Continue to check that the IUD is still in place by feeling for the strings after every menstrual period.  You may need to take pain medicine such as  acetaminophen or ibuprofen. Only take medicines as directed by your health care Tyreshia Ingman. SEEK MEDICAL CARE IF:   You have bleeding that is heavier or lasts longer than a normal menstrual cycle.  You have a fever.  You have increasing cramps or abdominal pain not relieved with medicine.  You have abdominal pain that does not seem to be related to the same area of earlier cramping and pain.  You are lightheaded, unusually weak, or faint.  You have abnormal vaginal discharge or smells.  You have pain during sexual intercourse.  You cannot feel the IUD strings, or the IUD string has gotten longer.  You feel the IUD at the opening of the cervix in the vagina.  You think you are pregnant, or you miss your menstrual period.  The IUD string is hurting your sex partner. MAKE SURE YOU:  Understand these instructions.  Will watch your condition.  Will get help right away if you are not doing well or get worse. Document Released: 05/06/2011 Document Revised: 06/28/2013 Document Reviewed: 02/26/2013 ExitCare Patient Information 2015 [employer]. This information is not intended to replace advice given to you by your health care Dysen Edmondson. Make sure you discuss any questions you have with your health care Emmanuella Mirante.  

## 2015-07-08 ENCOUNTER — Telehealth: Payer: Self-pay | Admitting: Family Medicine

## 2015-07-08 MED ORDER — CLONIDINE HCL 0.2 MG PO TABS: 0.2000 mg | ORAL_TABLET | Freq: Two times a day (BID) | ORAL | Status: DC

## 2015-07-08 NOTE — Telephone Encounter (Signed)
Rx done. 

## 2015-07-08 NOTE — Telephone Encounter (Signed)
Pt has fup on 11/3.  However will run out of cloNIDine (CATAPRES) 0.2 MG tablet On 10/26. Can you send enough to get her to her appt?  [facility] / [address]

## 2015-07-23 ENCOUNTER — Encounter: Payer: BLUE CROSS/BLUE SHIELD | Attending: Physical Medicine & Rehabilitation

## 2015-07-23 ENCOUNTER — Encounter: Payer: Self-pay | Admitting: Physical Medicine & Rehabilitation

## 2015-07-23 ENCOUNTER — Ambulatory Visit (HOSPITAL_BASED_OUTPATIENT_CLINIC_OR_DEPARTMENT_OTHER): Payer: BLUE CROSS/BLUE SHIELD | Admitting: Physical Medicine & Rehabilitation

## 2015-07-23 VITALS — BP 167/106 | HR 85 | Resp 14

## 2015-07-23 DIAGNOSIS — M25561 Pain in right knee: Secondary | ICD-10-CM | POA: Insufficient documentation

## 2015-07-23 DIAGNOSIS — G8929 Other chronic pain: Secondary | ICD-10-CM

## 2015-07-23 DIAGNOSIS — M545 Low back pain: Secondary | ICD-10-CM | POA: Insufficient documentation

## 2015-07-23 DIAGNOSIS — M533 Sacrococcygeal disorders, not elsewhere classified: Secondary | ICD-10-CM

## 2015-07-23 NOTE — Progress Notes (Signed)
Subjective:    Patient ID: Maria Powers, female    DOB: 12/30/1975, 39 y.o.   MRN: [id number]  HPI Pain only 2/10 today, Her normal pain is around 4/10 Right sacroiliac injection 9/27, doing very well  States that from a depression standpoint she is doing okay. She still doesn't like to interact much with people, she does have a dog.  Pain Inventory Average Pain 4 Pain Right Now 4 My pain is na  In the last 24 hours, has pain interfered with the following? General activity 3 Relation with others 2 Enjoyment of life 2 What TIME of day is your pain at its worst? evening, night Sleep (in general) Fair  Pain is worse with: unsure Pain improves with: medication Relief from Meds: na  Mobility Do you have any goals in this area?  no  Function Do you have any goals in this area?  no  Neuro/Psych No problems in this area  Prior Studies Any changes since last visit?  no  Physicians involved in your care Any changes since last visit?  no   Family History  Problem Relation Age of Onset  . Addison's disease Mother   . Diabetes Mother   . Bipolar disorder Mother   . Fibroids Maternal Aunt    Social History   Social History  . Marital Status: Single    Spouse Name: N/A  . Number of Children: N/A  . Years of Education: N/A   Social History Main Topics  . Smoking status: Former Games developer  . Smokeless tobacco: Never Used  . Alcohol Use: No  . Drug Use: No  . Sexual Activity:    Partners: Male    Birth Control/ Protection: Abstinence, Injection     Comment: Depo Provera injection   Other Topics Concern  . 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 Shoulder arthroscopy Right 2007  . Knee arthroscopy Right 2003  . Kidney stone surgery  2007    . Wisdom tooth extraction  Highschool  . Knee arthroscopy Right 2007   Past Medical History  Diagnosis Date  . Hypertension   . Bipolar 1 disorder (HCC)   . Anxiety   . Depression   . Dysmenorrhea    BP 167/106 mmHg  Pulse 85  Resp 14  SpO2 100%  Opioid Risk Score:   Fall Risk Score:  `1  Depression screen PHQ 2/9  Depression screen [facility] 2/9 06/18/2015 01/03/2015 02/27/2013  Decreased Interest 1 1 0  Down, Depressed, Hopeless 1 1 -  PHQ - 2 Score 2 2 0  Altered sleeping - 3 -  Tired, decreased energy - 2 -  Change in appetite - 0 -  Feeling bad or failure about yourself  - 1 -  Trouble concentrating - 1 -  Moving slowly or fidgety/restless - 0 -  Suicidal thoughts - 0 -  PHQ-9 Score - 9 -     Review of Systems  All other systems reviewed and are negative.      Objective:   Physical Exam  Constitutional: She is oriented to person, place, and time. She appears well-developed and well-nourished.  HENT:  Head: Normocephalic and atraumatic.  Eyes: Conjunctivae  and EOM are normal. Pupils are equal, round, and reactive to light.  Neck: Normal range of motion.  Neurological: She is alert and oriented to person, place, and time. She has normal strength. No sensory deficit.  Motor strength is 5/5 bilateral hip flexor and extensor ankle dorsiflex and plantar flexor  Negative straight leg raising test  Psychiatric: She has a normal mood and affect.  Nursing note and vitals reviewed.  Right knee without evidence of effusion she has medial joint line tenderness. No tenderness around the patella. No evidence of erythema. No pain with range of motion or with ambulation.       Assessment & Plan:  1. Right sacroiliac dysfunction has good relief with sacroiliac injections. She has approximately 2 month duration of benefit. We discussed radiofrequency neurotomy however this is less reliable in terms of effect. Would recommend continuing fluoroscopic guided right sacroiliac  injection every 3 month basis. Now that her pain is under better control I think she can undergo physical therapy. She has been trying some home exercises however she does not have good technique and requires supervision and instruction.  I will see her back in approximately 6 weeks for reinjection 2. Right knee pain has not had x-rays yet patient plans to go soon. We'll follow up on this at next visit

## 2015-07-24 ENCOUNTER — Ambulatory Visit (INDEPENDENT_AMBULATORY_CARE_PROVIDER_SITE_OTHER): Payer: BLUE CROSS/BLUE SHIELD | Admitting: Family Medicine

## 2015-07-24 ENCOUNTER — Encounter: Payer: Self-pay | Admitting: Family Medicine

## 2015-07-24 VITALS — BP 122/88 | HR 97 | Temp 98.6°F | Ht 65.0 in | Wt 260.4 lb

## 2015-07-24 DIAGNOSIS — I10 Essential (primary) hypertension: Secondary | ICD-10-CM

## 2015-07-24 DIAGNOSIS — G43109 Migraine with aura, not intractable, without status migrainosus: Secondary | ICD-10-CM

## 2015-07-24 DIAGNOSIS — Z23 Encounter for immunization: Secondary | ICD-10-CM

## 2015-07-24 MED ORDER — RIZATRIPTAN BENZOATE 5 MG PO TBDP: 5.0000 mg | ORAL_TABLET | ORAL | Status: DC | PRN

## 2015-07-24 MED ORDER — AMLODIPINE BESYLATE 5 MG PO TABS: 5.0000 mg | ORAL_TABLET | Freq: Every day | ORAL | Status: DC

## 2015-07-24 NOTE — Patient Instructions (Signed)
BEFORE YOU LEAVE: -flu shot -schedule physical exam and follow up in 1 month and come fasting for labs  Taper off of the clonidine: -1/2 tab twice daily for 4 days, then 1/2 tab daily for 4 days, then 1/2 tab every other day for 1 week, then stop  Start the norvasc 5mg  daily for blood pressure instead  Talk with you other doctors about simplifying your medication list and the interactions with your various medications.  We recommend the following healthy lifestyle measures: - eat a healthy whole foods diet consisting of regular small meals composed of vegetables, fruits, beans, nuts, seeds, healthy meats such as white chicken and fish and whole grains.  - avoid sweets, white starchy foods, fried foods, fast food, processed foods, sodas, red meet and other fattening foods.  - get a least 150-300 minutes of aerobic exercise per week.

## 2015-07-24 NOTE — Progress Notes (Signed)
HPI:  Maria Powers is a 39 yo F patient whom I inherited from my colleague in 2015. She has Bipolar disorder and is under the care of psychiatrist for this. She sees Dr. Bryson Dames in PMR for various chronic pain complaints. She sees her gynecologist for her menorrhagia and women's health issues. She has hypertension and was on clonidine for this with her past PCPs and has been on this for a long time. Denies CP, SOB, DOE, sedation, AMS. She has chronic migraines, 6-12 per year and reports maxalt is the only thing that has worked for these and requests refill - prior PCP started her on this.  ROS: See pertinent positives and negatives per HPI.  Past Medical History  Diagnosis Date  . Hypertension   . Bipolar 1 disorder (HCC)   . Anxiety   . Depression   . Dysmenorrhea     Past Surgical History  Procedure Laterality Date  . Shoulder arthroscopy Right 2007  . Knee arthroscopy Right 2003  . Kidney stone surgery  2007  . Wisdom tooth extraction  Highschool  . Knee arthroscopy Right 2007    Family History  Problem Relation Age of Onset  . Addison's disease Mother   . Diabetes Mother   . Bipolar disorder Mother   . Fibroids Maternal Aunt     Social History   Social History  . Marital Status: Single    Spouse Name: N/A  . Number of Children: N/A  . Years of Education: N/A   Social History Main Topics  . Smoking status: Former Games developer  . Smokeless tobacco: Never Used  . Alcohol Use: No  . Drug Use: No  . Sexual Activity:    Partners: Male    Birth Control/ Protection: Abstinence, Injection     Comment: Depo Provera injection   Other Topics Concern  . 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  busPIRone (BUSPAR) 10 MG tablet, Take 10 mg by mouth 2 (two)  times daily., Disp: , Rfl:  .  Cholecalciferol (D3 DOTS) 2000 UNITS TBDP, Take by mouth., Disp: , Rfl:  .  cyclobenzaprine (FLEXERIL) 10 MG tablet, Take 1 tablet (10 mg total) by mouth 3 (three) times daily., Disp: 90 tablet, Rfl: 7 .  ferrous sulfate 325 (65 FE) MG tablet, Take 325 mg by mouth daily with breakfast., Disp: , Rfl:  .  FLUoxetine (PROZAC) 40 MG capsule, Take 40 mg by mouth daily., Disp: , Rfl:  .  gabapentin (NEURONTIN) 100 MG capsule, Take 1 capsule (100 mg total) by mouth 2 (two) times daily., Disp: 60 capsule, Rfl: 6 .  hydrOXYzine (ATARAX/VISTARIL) 50 MG tablet, Take 100 mg by mouth at bedtime as needed. , Disp: , Rfl: 0 .  lamoTRIgine (LAMICTAL) 150 MG tablet, Take 150 mg by mouth. 2 tabs at bedtime, Disp: , Rfl:  .  levonorgestrel (MIRENA) 20 MCG/24HR IUD, 1 each by Intrauterine route once., Disp: , Rfl:  .  lithium carbonate 300 MG capsule, Take 300 mg by mouth 2 (two) times daily with a meal. , Disp: , Rfl: 1 .  Magnesium Citrate 100 MG TABS, Take by mouth. Takes 2 tablets at night., Disp: , Rfl:  .  Potassium Gluconate 595 MG CAPS, Take by mouth., Disp: , Rfl:  .  rizatriptan (MAXALT-MLT) 5 MG disintegrating tablet, Take 1 tablet (5 mg total) by mouth as needed., Disp: 10 tablet, Rfl: 3 .  traMADol (ULTRAM) 50 MG tablet, Take 1 tablet (50 mg total) by mouth every 6 (six) hours as needed., Disp: 90 tablet, Rfl: 5 .  amLODipine (NORVASC) 5 MG tablet, Take 1 tablet (5 mg total) by mouth daily., Disp: 90 tablet, Rfl: 3  EXAM:  Filed Vitals:   07/24/15 0953  BP: 122/88  Pulse: 97  Temp: 98.6 F (37 C)    Body mass index is 43.33 kg/(m^2).  GENERAL: vitals reviewed and listed above, alert, oriented, appears well hydrated and in no acute distress  HEENT: atraumatic, conjunttiva clear, no obvious abnormalities on inspection of external nose and ears  NECK: no obvious masses on inspection  LUNGS: clear to auscultation bilaterally, no wheezes, rales or rhonchi, good  air movement  CV: HRRR, no peripheral edema  MS: moves all extremities without noticeable abnormality  PSYCH: pleasant and cooperative, no obvious depression or anxiety  ASSESSMENT AND PLAN:  Discussed the following assessment and plan:  Essential hypertension  Migraine with aura and without status migrainosus, not intractable  Morbid obesity, unspecified obesity type (HCC)  -risks of many interactions discussed  -advised she simplify her medication list if possible with her specialist -discussed risks of interactions with maxalt -discussed interactions and risks with clonidine and opted for taper off and norvasc instead -close follow up in 1 month for physical and BP recheck -Patient advised to return or notify a doctor immediately if symptoms worsen or persist or new concerns arise.  Patient Instructions  BEFORE YOU LEAVE: -flu shot -schedule physical exam and follow up in 1 month and come fasting for labs  Taper off of the clonidine: -1/2 tab twice daily for 4 days, then 1/2 tab daily for 4 days, then 1/2 tab every other day for 1 week, then stop  Start the norvasc 5mg  daily for blood pressure instead  Talk with you other doctors about simplifying your medication list and the interactions with your various medications.  We recommend the following healthy lifestyle measures: - eat a healthy whole foods diet consisting of regular small meals composed of vegetables, fruits, beans, nuts, seeds, healthy meats such as white chicken and fish and whole grains.  - avoid sweets, white starchy foods, fried foods, fast food, processed foods, sodas, red meet and other fattening foods.  - get a least 150-300 minutes of aerobic exercise per week.       Kriste BasqueKIM, Jamelia Varano R.

## 2015-07-24 NOTE — Progress Notes (Signed)
Pre visit review using our clinic review tool, if applicable. No additional management support is needed unless otherwise documented below in the visit note. 

## 2015-07-26 ENCOUNTER — Ambulatory Visit (INDEPENDENT_AMBULATORY_CARE_PROVIDER_SITE_OTHER): Payer: BLUE CROSS/BLUE SHIELD | Admitting: Obstetrics and Gynecology

## 2015-07-26 ENCOUNTER — Encounter: Payer: Self-pay | Admitting: Obstetrics and Gynecology

## 2015-07-26 VITALS — BP 126/86 | HR 80 | Resp 18 | Ht 65.0 in | Wt 257.0 lb

## 2015-07-26 DIAGNOSIS — Z30431 Encounter for routine checking of intrauterine contraceptive device: Secondary | ICD-10-CM

## 2015-07-26 NOTE — Progress Notes (Signed)
GYNECOLOGY  VISIT   HPI: 39 y.o.   Single  Caucasian  female   G1P1001 with No LMP recorded. Patient is not currently having periods (Reason: IUD).   here for   IUD Check Mirena placed on 06/21/15.   Coworkers have noticed improved mood.   Vaginal bleeding is gone.  Minor spotting only.  Uncertain if having cramping due to the IUD or some pelvic aching.   GYNECOLOGIC HISTORY: No LMP recorded. Patient is not currently having periods (Reason: IUD). Contraception: IUD  Menopausal hormone therapy: None Last mammogram: None Last pap smear: 07/12/14 ASCUS HR HPV:neg        OB History    Gravida Para Term Preterm AB TAB SAB Ectopic Multiple Living   1 1 1       1          Patient Active Problem List   Diagnosis Date Noted  . Sacroiliac dysfunction 08/14/2014  . Muscle pain, myofascial 08/14/2014  . Chronic back pain 05/25/2014  . Essential hypertension, benign 04/30/2014  . Menorrhagia 04/18/2014  . Dysmenorrhea 04/18/2014  . Ear pain 07/10/2013  . Migraine headache 07/10/2013  . Low back pain radiating to right leg 05/18/2012    Past Medical History  Diagnosis Date  . Hypertension   . Bipolar 1 disorder (HCC)   . Anxiety   . Depression   . Dysmenorrhea     Past Surgical History  Procedure Laterality Date  . Shoulder arthroscopy Right 2007  . Knee arthroscopy Right 2003  . Kidney stone surgery  2007  . Wisdom tooth extraction  Highschool  . Knee arthroscopy Right 2007    Current Outpatient Prescriptions  Medication Sig Dispense Refill  . amLODipine (NORVASC) 5 MG tablet Take 1 tablet (5 mg total) by mouth daily. 90 tablet 3  . busPIRone (BUSPAR) 10 MG tablet Take 10 mg by mouth 2 (two) times daily.    . Cholecalciferol (D3 DOTS) 2000 UNITS TBDP Take by mouth.    . cloNIDine (CATAPRES) 0.2 MG tablet   0  . cyclobenzaprine (FLEXERIL) 10 MG tablet Take 1 tablet (10 mg total) by mouth 3 (three) times daily. 90 tablet 7  . ferrous sulfate 325 (65 FE) MG tablet Take  325 mg by mouth daily with breakfast.    . FLUoxetine (PROZAC) 40 MG capsule Take 40 mg by mouth daily.    Marland Kitchen. gabapentin (NEURONTIN) 100 MG capsule Take 1 capsule (100 mg total) by mouth 2 (two) times daily. 60 capsule 6  . hydrOXYzine (ATARAX/VISTARIL) 50 MG tablet Take 100 mg by mouth at bedtime as needed.   0  . lamoTRIgine (LAMICTAL) 150 MG tablet Take 150 mg by mouth. 2 tabs at bedtime    . levonorgestrel (MIRENA) 20 MCG/24HR IUD 1 each by Intrauterine route once.    . lithium carbonate 300 MG capsule Take 300 mg by mouth 2 (two) times daily with a meal.   1  . Magnesium Citrate 100 MG TABS Take by mouth. Takes 2 tablets at night.    . Potassium Gluconate 595 MG CAPS Take by mouth.    . rizatriptan (MAXALT-MLT) 5 MG disintegrating tablet Take 1 tablet (5 mg total) by mouth as needed. 10 tablet 3  . traMADol (ULTRAM) 50 MG tablet Take 1 tablet (50 mg total) by mouth every 6 (six) hours as needed. 90 tablet 5   No current facility-administered medications for this visit.     ALLERGIES: Clindamycin/lincomycin and Nubain  Family History  Problem  Relation Age of Onset  . Addison's disease Mother   . Diabetes Mother   . Bipolar disorder Mother   . Fibroids Maternal Aunt     Social History   Social History  . Marital Status: Single    Spouse Name: N/A  . Number of Children: N/A  . Years of Education: N/A   Occupational History  . Not on file.   Social History Main Topics  . Smoking status: Former Games developer  . Smokeless tobacco: Never Used  . Alcohol Use: No  . Drug Use: No  . Sexual Activity:    Partners: Male    Birth Control/ Protection: Abstinence, IUD   Other Topics Concern  . 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  PHYSICAL EXAMINATION:    BP  126/86 mmHg  Pulse 80  Resp 18  Ht  (1.651 m)  Wt 257 lb (116.574 kg)  BMI 42.77 kg/m2    General appearance: alert, cooperative and appears stated age.    Pelvic: External genitalia:  no lesions              Urethra:  normal appearing urethra with no masses, tenderness or lesions              Bartholins and Skenes: normal                 Vagina: normal appearing vagina with normal color and discharge, no lesions              Cervix: no lesions.  IUD strings seen.  String trimmed to 2 cm.                Bimanual Exam:  Uterus:  normal size, contour, position, consistency, mobility, non-tender              Adnexa: normal adnexa and no mass, fullness, tenderness          Chaperone was present for exam.  ASSESSMENT  Mirena IUD check up.  Menometrorrhagia controlled.  Doing well.   PLAN  Discussion of potential long term use of Mirena IUDs.  Will schedule annual exam.    An After Visit Summary was printed and given to the patient.  _15_____ minutes face to face time of which over 50% was spent in counseling.

## 2015-08-06 ENCOUNTER — Encounter: Payer: Self-pay | Admitting: Physical Medicine & Rehabilitation

## 2015-08-06 ENCOUNTER — Ambulatory Visit (HOSPITAL_BASED_OUTPATIENT_CLINIC_OR_DEPARTMENT_OTHER): Payer: BLUE CROSS/BLUE SHIELD | Admitting: Physical Medicine & Rehabilitation

## 2015-08-06 VITALS — BP 171/86 | HR 103

## 2015-08-06 DIAGNOSIS — M545 Low back pain, unspecified: Secondary | ICD-10-CM

## 2015-08-06 DIAGNOSIS — M25551 Pain in right hip: Secondary | ICD-10-CM | POA: Diagnosis not present

## 2015-08-06 DIAGNOSIS — M533 Sacrococcygeal disorders, not elsewhere classified: Secondary | ICD-10-CM | POA: Diagnosis not present

## 2015-08-06 DIAGNOSIS — G8929 Other chronic pain: Secondary | ICD-10-CM

## 2015-08-06 MED ORDER — KETOROLAC TROMETHAMINE 60 MG/2ML IM SOLN
60.0000 mg | Freq: Once | INTRAMUSCULAR | Status: AC
  Administered 2015-08-06: 60 mg via INTRAMUSCULAR

## 2015-08-06 NOTE — Patient Instructions (Addendum)
Please go up to 3 times per day on your gabapentin  Flexeril 3 times a day as needed  Continue tramadol 50 mg 4 times per day  We'll check hip and pelvic x-rays  We'll give you a Toradol shot today

## 2015-08-06 NOTE — Progress Notes (Signed)
Subjective:    Patient ID: Maria Powers, female    DOB: 1976-05-12, 39 y.o.   MRN: [id number]  HPI 39 year old female with long history of low back pain. She has had a history of sacroiliac pain which started around the time of her pregnancy. She has responded well to sacroiliac injections and the last injection was approximately 8 weeks ago. More recently she has been complaining of pain going down both legs into the thighs from the low back area. She has a burning and an icy hot discomfort. Her current discomfort does not feel like her usual sacroiliac pain. She does not recall any type of lifting injury or other injury lately. This more recent pain started about 2 days ago. The patient states the pain has not increased in intensity but has been moving a little bit from the right side toward the left side. No weakness in the legs. She is walking without an assistive device. No bowel or bladder dysfunction.  Movement such as squatting and twisting seem to make the pain worse. Changing positions seems to worsen the pain. Patient cannot say whether standing or sitting are a worst position. Pain Inventory Average Pain 3 Pain Right Now 8 My pain is constant  In the last 24 hours, has pain interfered with the following? General activity 10 Relation with others 8 Enjoyment of life 10 What TIME of day is your pain at its worst? evening and night Sleep (in general) NA  Pain is worse with: NA Pain improves with: medication and injections Relief from Meds: 7  Mobility Do you have any goals in this area?  no  Function Do you have any goals in this area?  no  Neuro/Psych No problems in this area  Prior Studies Any changes since last visit?  no  Physicians involved in your care Any changes since last visit?  no   Family History  Problem Relation Age of Onset  . Addison's disease Mother   . Diabetes Mother   . Bipolar disorder Mother   . Fibroids Maternal Aunt    Social  History   Social History  . Marital Status: Single    Spouse Name: N/A  . Number of Children: N/A  . Years of Education: N/A   Social History Main Topics  . Smoking status: Former Games developer  . Smokeless tobacco: Never Used  . Alcohol Use: No  . Drug Use: No  . Sexual Activity:    Partners: Male    Birth Control/ Protection: Abstinence, IUD   Other Topics Concern  . 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 Shoulder arthroscopy Right 2007  . Knee arthroscopy Right 2003  . Kidney stone surgery  2007  . Wisdom tooth extraction  Highschool  . Knee arthroscopy Right 2007   Past Medical History  Diagnosis Date  . Hypertension   . Bipolar 1 disorder (HCC)   . Anxiety   . Depression   . Dysmenorrhea    Pulse 103  SpO2 97%  Opioid Risk Score:   Fall Risk Score:  `1  Depression screen PHQ 2/9  Depression screen [facility] 2/9 06/18/2015 01/03/2015 02/27/2013  Decreased Interest 1 1 0  Down,  Depressed, Hopeless 1 1 -  PHQ - 2 Score 2 2 0  Altered sleeping - 3 -  Tired, decreased energy - 2 -  Change in appetite - 0 -  Feeling bad or failure about yourself  - 1 -  Trouble concentrating - 1 -  Moving slowly or fidgety/restless - 0 -  Suicidal thoughts - 0 -  PHQ-9 Score - 9 -     Review of Systems  All other systems reviewed and are negative.      Objective:   Physical Exam  Constitutional: She is oriented to person, place, and time. She appears well-developed and well-nourished.  HENT:  Head: Normocephalic and atraumatic.  Eyes: Conjunctivae and EOM are normal. Pupils are equal, round, and reactive to light.  Neck: Normal range of motion.  Musculoskeletal:  Pain with right hip range of motion internal and external rotation. Milder pain with left hip  range of motion internal/external rotation. Complaints of lateral hip pain with these motions.  Neurological: She is alert and oriented to person, place, and time. She displays no tremor. She exhibits normal muscle tone.  Reflex Scores:      Patellar reflexes are 2+ on the right side and 2+ on the left side.      Achilles reflexes are 2+ on the right side and 2+ on the left side. Normal sensation to pinprick bilateral L2-L3 L4 L5 S1 dermatomal distribution Motor strength is 5/5 bilateral hip flexor and extensor stretch plan flexor   Psychiatric: Her mood appears anxious.  Nursing note and vitals reviewed.   Patient has tenderness to palpation starting at L4-L5 and S1 level. In addition she has her right PSIS pain. Lumbar range of motion limited 0-25% with flexion, extension, lateral bending and rotation. No pain to palpation over the greater trochanter of the hip bilaterally. No evidence of joint swelling in the fingers or toes, wrists or ankles bilaterally.    Assessment & Plan:  1. Bilateral, right greater than left, hip and back pain. Relatively acute onset. No history of trauma. No evidence of polyarthropathy. No clear cut signs of radiculopathy. She however does feel some relief with taking gabapentin.  Given her hip examination would like to get hip and pelvic x-rays. We'll do Toradol all injection 60 mg IM today We'll give her a work note for tomorrow.  Should be able to go back to work on Thursday I'll see her in 2 weeks

## 2015-08-07 ENCOUNTER — Ambulatory Visit (HOSPITAL_COMMUNITY)
Admission: RE | Admit: 2015-08-07 | Discharge: 2015-08-07 | Disposition: A | Discharge status: Home / Self Care | Payer: BLUE CROSS/BLUE SHIELD | Source: Ambulatory Visit | Attending: Physical Medicine & Rehabilitation | Admitting: Physical Medicine & Rehabilitation

## 2015-08-07 DIAGNOSIS — M25551 Pain in right hip: Secondary | ICD-10-CM | POA: Insufficient documentation

## 2015-08-07 DIAGNOSIS — M25552 Pain in left hip: Secondary | ICD-10-CM | POA: Diagnosis not present

## 2015-08-07 DIAGNOSIS — M25561 Pain in right knee: Secondary | ICD-10-CM | POA: Insufficient documentation

## 2015-08-12 ENCOUNTER — Telehealth: Payer: Self-pay

## 2015-08-12 MED ORDER — GABAPENTIN 100 MG PO CAPS: 100.0000 mg | ORAL_CAPSULE | Freq: Three times a day (TID) | ORAL | Status: DC

## 2015-08-12 NOTE — Telephone Encounter (Signed)
Patient called and state that the medication that was supposed to be increase had not been sent to the pharmacy.  Patient says she is supposed to be taking gabapentin tid.  Please advise.

## 2015-08-12 NOTE — Telephone Encounter (Signed)
Gabapentin 100 mg 3 times a day #90, one refill

## 2015-08-12 NOTE — Telephone Encounter (Signed)
Medication sent in to pharmacy. MAH 

## 2015-08-23 ENCOUNTER — Encounter: Payer: Self-pay | Admitting: Family Medicine

## 2015-08-23 ENCOUNTER — Ambulatory Visit (INDEPENDENT_AMBULATORY_CARE_PROVIDER_SITE_OTHER): Payer: BLUE CROSS/BLUE SHIELD | Admitting: Family Medicine

## 2015-08-23 VITALS — BP 124/84 | HR 92 | Temp 98.6°F | Ht 65.0 in | Wt 256.3 lb

## 2015-08-23 DIAGNOSIS — E669 Obesity, unspecified: Secondary | ICD-10-CM | POA: Diagnosis not present

## 2015-08-23 DIAGNOSIS — I10 Essential (primary) hypertension: Secondary | ICD-10-CM

## 2015-08-23 NOTE — Progress Notes (Signed)
HPI:   Follow up HTN: -meds: norvasc -reports: doing well, no CP, palpitations, swelling -on some new medications from psych (adding doxepin for sleep soon - unsure of dose)   ROS: See pertinent positives and negatives per HPI.  Past Medical History  Diagnosis Date  . Hypertension   . Bipolar 1 disorder (HCC)   . Anxiety   . Depression   . Dysmenorrhea   . Chronic pain     back, hip - seeing PMR  . Obesity     Past Surgical History  Procedure Laterality Date  . Shoulder arthroscopy Right 2007  . Knee arthroscopy Right 2003  . Kidney stone surgery  2007  . Wisdom tooth extraction  Highschool  . Knee arthroscopy Right 2007    Family History  Problem Relation Age of Onset  . Addison's disease Mother   . Diabetes Mother   . Bipolar disorder Mother   . Fibroids Maternal Aunt     Social History   Social History  . Marital Status: Single    Spouse Name: N/A  . Number of Children: N/A  . Years of Education: N/A   Social History Main Topics  . Smoking status: Former Games developer  . Smokeless tobacco: Never Used  . Alcohol Use: No  . Drug Use: No  . Sexual Activity:    Partners: Male    Birth Control/ Protection: Abstinence, IUD   Other Topics Concern  . 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  amLODipine (NORVASC) 5 MG tablet, Take 1 tablet (5 mg total) by mouth daily., Disp: 90 tablet, Rfl: 3 .  busPIRone (BUSPAR) 10 MG tablet, Take 10 mg by mouth 2 (two) times daily., Disp: , Rfl:  .  Cholecalciferol (D3 DOTS) 2000 UNITS TBDP, Take by mouth., Disp: , Rfl:  .  cyclobenzaprine (FLEXERIL) 10 MG tablet, Take 1 tablet (10 mg total) by mouth 3 (three) times daily., Disp: 90 tablet, Rfl: 7 .  ferrous sulfate 325 (65 FE) MG tablet, Take 325 mg by mouth daily  with breakfast., Disp: , Rfl:  .  FLUoxetine (PROZAC) 40 MG capsule, Take 40 mg by mouth daily., Disp: , Rfl:  .  gabapentin (NEURONTIN) 100 MG capsule, Take 1 capsule (100 mg total) by mouth 3 (three) times daily., Disp: 90 capsule, Rfl: 1 .  lamoTRIgine (LAMICTAL) 150 MG tablet, Take 150 mg by mouth. 2 tabs at bedtime, Disp: , Rfl:  .  levonorgestrel (MIRENA) 20 MCG/24HR IUD, 1 each by Intrauterine route once., Disp: , Rfl:  .  lithium carbonate 300 MG capsule, Take 300 mg by mouth 2 (two) times daily with a meal. , Disp: , Rfl: 1 .  Magnesium Citrate 100 MG TABS, Take by mouth. Takes 2 tablets at night., Disp: , Rfl:  .  Potassium Gluconate 595 MG CAPS, Take by mouth., Disp: , Rfl:  .  rizatriptan (MAXALT-MLT) 5 MG disintegrating tablet, Take 1 tablet (5 mg total) by mouth as needed., Disp: 10 tablet, Rfl: 3 .  traMADol (ULTRAM) 50 MG tablet, Take 1 tablet (50 mg total) by mouth every 6 (six) hours as needed., Disp: 90 tablet, Rfl: 5  EXAM:  Filed Vitals:   08/23/15 0859  BP: 124/84  Pulse: 92  Temp: 98.6 F (37 C)    Body mass index is 42.65 kg/(m^2).  GENERAL: vitals reviewed and listed above, alert, oriented, appears well hydrated and in no acute distress  HEENT: atraumatic, conjunttiva clear, no obvious abnormalities on inspection of external nose and ears  NECK: no obvious masses on inspection  LUNGS: clear to auscultation bilaterally, no wheezes, rales or rhonchi, good air movement  CV: HRRR, no peripheral edema  MS: moves all extremities without noticeable abnormality  PSYCH: pleasant and cooperative, no obvious depression or anxiety  ASSESSMENT AND PLAN:  Discussed the following assessment and plan:  Obesity  -Patient advised to return or notify a doctor immediately if symptoms worsen or persist or new concerns arise.  Patient Instructions  Follow up as schedule for Physical Exam  Continue current blood pressure medication  We recommend the following  healthy lifestyle measures: - eat a healthy whole foods diet consisting of regular small meals composed of vegetables, fruits, beans, nuts, seeds, healthy meats such as white chicken and fish and whole grains.  - avoid sweets, white starchy foods, fried foods, fast food, processed foods, sodas, red meet and other fattening foods.  - get a least 150-300 minutes of aerobic exercise per week.       Kriste BasqueKIM, HANNAH R.

## 2015-08-23 NOTE — Progress Notes (Signed)
Pre visit review using our clinic review tool, if applicable. No additional management support is needed unless otherwise documented below in the visit note. 

## 2015-08-23 NOTE — Patient Instructions (Signed)
Follow up as schedule for Physical Exam  Continue current blood pressure medication  We recommend the following healthy lifestyle measures: - eat a healthy whole foods diet consisting of regular small meals composed of vegetables, fruits, beans, nuts, seeds, healthy meats such as white chicken and fish and whole grains.  - avoid sweets, white starchy foods, fried foods, fast food, processed foods, sodas, red meet and other fattening foods.  - get a least 150-300 minutes of aerobic exercise per week.

## 2015-09-02 ENCOUNTER — Encounter: Payer: BLUE CROSS/BLUE SHIELD | Attending: Physical Medicine & Rehabilitation

## 2015-09-02 ENCOUNTER — Encounter: Payer: Self-pay | Admitting: Physical Medicine & Rehabilitation

## 2015-09-02 ENCOUNTER — Ambulatory Visit (HOSPITAL_BASED_OUTPATIENT_CLINIC_OR_DEPARTMENT_OTHER): Payer: BLUE CROSS/BLUE SHIELD | Admitting: Physical Medicine & Rehabilitation

## 2015-09-02 VITALS — BP 153/95 | HR 94 | Resp 16

## 2015-09-02 DIAGNOSIS — M545 Low back pain: Secondary | ICD-10-CM | POA: Diagnosis not present

## 2015-09-02 DIAGNOSIS — M1711 Unilateral primary osteoarthritis, right knee: Secondary | ICD-10-CM

## 2015-09-02 DIAGNOSIS — M549 Dorsalgia, unspecified: Secondary | ICD-10-CM | POA: Diagnosis not present

## 2015-09-02 DIAGNOSIS — M13861 Other specified arthritis, right knee: Secondary | ICD-10-CM

## 2015-09-02 DIAGNOSIS — G8929 Other chronic pain: Secondary | ICD-10-CM

## 2015-09-02 DIAGNOSIS — M533 Sacrococcygeal disorders, not elsewhere classified: Secondary | ICD-10-CM

## 2015-09-02 DIAGNOSIS — M25561 Pain in right knee: Secondary | ICD-10-CM | POA: Insufficient documentation

## 2015-09-02 MED ORDER — GABAPENTIN 100 MG PO CAPS: 100.0000 mg | ORAL_CAPSULE | Freq: Three times a day (TID) | ORAL | Status: DC

## 2015-09-02 MED ORDER — CYCLOBENZAPRINE HCL 10 MG PO TABS: 10.0000 mg | ORAL_TABLET | Freq: Three times a day (TID) | ORAL | Status: DC

## 2015-09-02 NOTE — Patient Instructions (Signed)
Do hip stretching as demonstrated

## 2015-09-02 NOTE — Progress Notes (Signed)
Subjective:    Patient ID: Maria Powers, female    DOB: 12/03/1975, 39 y.o.   MRN: [id number]  HPI  39 year old female with history of sacroiliac dysfunction on the right side for several years duration. She had an episode of moderate to severe right hip and buttocks pain about 1 month ago. She received a Toradol injection which helped for a couple hours. She had x-rays of the hip as well as the knees. Patient states that overall her pain has improved. She feels like she may need the sacroiliac injection again soon however she iis trying to coordinate some days off in February and would like to do it at that time  No numbness or tingling in the leg however she does get some relief from gabapentin 100 mg 3 times a day. Pain Inventory Average Pain 3 Pain Right Now 3 My pain is constant  In the last 24 hours, has pain interfered with the following? General activity 8 Relation with others 1 Enjoyment of life 2 What TIME of day is your pain at its worst? evening, night Sleep (in general) Fair  Pain is worse with: unsure Pain improves with: na Relief from Meds: 5  Mobility Do you have any goals in this area?  no  Function Do you have any goals in this area?  no  Neuro/Psych No problems in this area  Prior Studies Any changes since last visit?  no  Physicians involved in your care Any changes since last visit?  no   Family History  Problem Relation Age of Onset  . Addison's disease Mother   . Diabetes Mother   . Bipolar disorder Mother   . Fibroids Maternal Aunt    Social History   Social History  . Marital Status: Single    Spouse Name: N/A  . Number of Children: N/A  . Years of Education: N/A   Social History Main Topics  . Smoking status: Former Games developermoker  . Smokeless tobacco: Never Used  . Alcohol Use: No  . Drug Use: No  . Sexual Activity:    Partners: Male    Birth Control/ Protection: Abstinence, IUD   Other Topics Concern  . None   Social History  Narrative   Work or School: Pharmacist, hospitalcustomers service dry cleaning      Home Situation: roommate      Spiritual Beliefs:mormon      Lifestyle: no regular exercise; horrible diet            Past Surgical History  Procedure Laterality Date  . Shoulder arthroscopy Right 2007  . Knee arthroscopy Right 2003  . Kidney stone surgery  2007  . Wisdom tooth extraction  Highschool  . Knee arthroscopy Right 2007   Past Medical History  Diagnosis Date  . Hypertension   . Bipolar 1 disorder (HCC)   . Anxiety   . Depression   . Dysmenorrhea   . Chronic pain     back, hip - seeing PMR  . Obesity    BP 153/95 mmHg  Pulse 94  Resp 16  SpO2 98%  Opioid Risk Score:   Fall Risk Score:  `1  Depression screen PHQ 2/9  [facility] 06/18/2015 01/03/2015 02/27/2013  Decreased Interest 1 1 0  Down, Depressed, Hopeless 1 1 -  PHQ - 2 Score 2 2 0  Altered sleeping - 3 -  Tired, decreased energy - 2 -  Change in appetite - 0 -  Feeling bad or failure about  yourself  - 1 -  Trouble concentrating - 1 -  Moving slowly or fidgety/restless - 0 -  Suicidal thoughts - 0 -  PHQ-9 Score - 9 -      Review of Systems  All other systems reviewed and are negative.      Objective:   Physical Exam Gen. No acute distress Mood and affect are appropriate Patient has some buttocks pain with hip internal/external rotation but no groin pain. There is no tenderness palpation over the lumbar spine or along the gluteus medius and maximus areas. No tenderness over the greater trochanter of the hips. Knee has no evidence of pain with palpation. There is no evidence of knee effusion. There is crepitus with repeated flexion and extension of the right lower extremity.  Negative straight leg raising Motor strength is 5/5 bilateral hip flexor and extensor ankle dorsiflexor  Gait shows no evidence to drag or knee instability       Assessment & Plan:   1. Chronic right sacroiliac joint dysfunction  overall at baseline. She does well with every three-month sacroiliac injections, she may have some increasing pain toward the beginning of next month given her plans to forego the injection at the end of December and schedule for February. Would continue tramadol 50 mg 3 times a day, Patient is also taking SSRI but no signs of serotonin syndrome  Would continue Flexeril 10 mg 3 times a day denies any sedation with this medication  2. Right hip pain improved. X-rays essentially negative. There is some sign of sacroiliac OA but no hip joint OA  3. Right knee pain this is relatively mild she does have evidence of osteochondral lesion posterior aspect of the patella, at this point would not prescribe Voltaren gel but this may be an option if this increases  Patient also taking gabapentin 100 mg 3 times a day no clear-cut sign of neuropathic pain. She does have some evidence of chronic pain syndrome which may represent some hypersensitivity, We'll continue this

## 2015-09-04 ENCOUNTER — Encounter: Payer: Self-pay | Admitting: Obstetrics and Gynecology

## 2015-09-04 ENCOUNTER — Ambulatory Visit (INDEPENDENT_AMBULATORY_CARE_PROVIDER_SITE_OTHER): Payer: BLUE CROSS/BLUE SHIELD | Admitting: Obstetrics and Gynecology

## 2015-09-04 VITALS — BP 152/80 | HR 92 | Resp 16 | Ht 64.5 in | Wt 256.0 lb

## 2015-09-04 DIAGNOSIS — Z01419 Encounter for gynecological examination (general) (routine) without abnormal findings: Secondary | ICD-10-CM | POA: Diagnosis not present

## 2015-09-04 NOTE — Progress Notes (Signed)
Patient ID: Maria Powers, female   DOB: 05/20/1976, 39 y.o.   MRN: [id number] 39 y.o. [id number] Single Caucasian female here for annual exam.   Happy with Mirena.  No real cycles. Just has blood tined discharge.  Mood is still good.  Some urinary incontinence with sneeze and cough for a long time.  Not interested in physical therapy.   Constipation. Thinks this is due to her meds.  Having fecal soiling.  Not bloody. Declines specialist referral.  Takes magnesium citrate. Uses Miralax also.   Has chronic back pain.   PCP: Kriste BasqueHannah Kim, MD.  Will see PCP in January 2017.  No LMP recorded. Patient is not currently having periods (Reason: IUD).          Sexually active: No.  The current method of family planning is IUD--Mirena inserted 06-21-15.    Exercising: No.   Smoker:  no  Health Maintenance: Pap:  07-12-14 Ascus:Neg HR HPV History of abnormal Pap:  no MMG:  n/a Colonoscopy:  n/a BMD:   n/a  Result  n/a TDaP:  07-12-14 Screening Labs:  Hb today: PCP, Urine today: unable to void   reports that she has quit smoking. She has never used smokeless tobacco. She reports that she does not drink alcohol or use illicit drugs.  Past Medical History  Diagnosis Date  . Hypertension   . Bipolar 1 disorder (HCC)   . Anxiety   . Depression   . Dysmenorrhea   . Chronic pain     back, hip - seeing PMR  . Obesity     Past Surgical History  Procedure Laterality Date  . Shoulder arthroscopy Right 2007  . Knee arthroscopy Right 2003  . Kidney stone surgery  2007  . Wisdom tooth extraction  Highschool  . Knee arthroscopy Right 2007    Current Outpatient Prescriptions  Medication Sig Dispense Refill  . amLODipine (NORVASC) 5 MG tablet Take 1 tablet (5 mg total) by mouth daily. 90 tablet 3  . busPIRone (BUSPAR) 10 MG tablet Take 10 mg by mouth 2 (two) times daily.    . Cholecalciferol (D3 DOTS) 2000 UNITS TBDP Take by mouth.    . cyclobenzaprine (FLEXERIL) 10 MG tablet Take 1  tablet (10 mg total) by mouth 3 (three) times daily. 90 tablet 7  . doxepin (SINEQUAN) 10 MG capsule   0  . ferrous sulfate 325 (65 FE) MG tablet Take 325 mg by mouth daily with breakfast.    . FLUoxetine (PROZAC) 40 MG capsule Take 40 mg by mouth daily.    Marland Kitchen. gabapentin (NEURONTIN) 100 MG capsule Take 1 capsule (100 mg total) by mouth 3 (three) times daily. 90 capsule 3  . lamoTRIgine (LAMICTAL) 150 MG tablet Take 150 mg by mouth. 2 tabs at bedtime    . levonorgestrel (MIRENA) 20 MCG/24HR IUD 1 each by Intrauterine route once.    . lithium carbonate 300 MG capsule Take 300 mg by mouth 2 (two) times daily with a meal.   1  . Magnesium Citrate 100 MG TABS Take by mouth. Takes 2 tablets at night.    . Potassium Gluconate 595 MG CAPS Take by mouth.    . rizatriptan (MAXALT-MLT) 5 MG disintegrating tablet Take 1 tablet (5 mg total) by mouth as needed. 10 tablet 3  . traMADol (ULTRAM) 50 MG tablet Take 1 tablet (50 mg total) by mouth every 6 (six) hours as needed. 90 tablet 5   No current facility-administered medications for this visit.  Family History  Problem Relation Age of Onset  . Addison's disease Mother   . Diabetes Mother   . Bipolar disorder Mother   . Fibroids Maternal Aunt     ROS:  Pertinent items are noted in HPI.  Otherwise, a comprehensive ROS was negative.  Exam:   BP 152/80 mmHg  Pulse 92  Resp 16  Ht 5' 4.5" (1.638 m)  Wt 256 lb (116.121 kg)  BMI 43.28 kg/m2    General appearance: alert, cooperative and appears stated age Head: Normocephalic, without obvious abnormality, atraumatic Neck: no adenopathy, supple, symmetrical, trachea midline and thyroid normal to inspection and palpation Lungs: clear to auscultation bilaterally Breasts: normal appearance, no masses or tenderness, Inspection negative, No nipple retraction or dimpling, No nipple discharge or bleeding, No axillary or supraclavicular adenopathy Heart: regular rate and rhythm Abdomen: soft, non-tender;  bowel sounds normal; no masses,  no organomegaly Extremities: extremities normal, atraumatic, no cyanosis or edema Skin: Skin color, texture, turgor normal. No rashes or lesions Lymph nodes: Cervical, supraclavicular, and axillary nodes normal. No abnormal inguinal nodes palpated Neurologic: Grossly normal  Pelvic: External genitalia:  no lesions              Urethra:  normal appearing urethra with no masses, tenderness or lesions              Bartholins and Skenes: normal                 Vagina: normal appearing vagina with normal color and discharge, no lesions              Cervix: no lesions and IUD strings seen.              Pap taken: No. Bimanual Exam:  Uterus:  normal size, contour, position, consistency, mobility, non-tender              Adnexa: normal adnexa and no mass, fullness, tenderness              Rectovaginal: No..   Chaperone was present for exam.  Assessment:   Well woman visit with normal exam. Mirena IUD patient.  Mild GSI.  Constipation.  Fecal incontinence.   Plan: Yearly mammogram recommended after age 22.  Recommended self breast exam.  Pap and HR HPV as above. Discussed Calcium, Vitamin D, regular exercise program including cardiovascular and weight bearing exercise. Encouraged weight loss.  Labs performed.  No..   Labs with PCP.  Refills given on medications.  No..    Discussed stool softeners.  Follow up annually and prn.      After visit summary provided.

## 2015-09-04 NOTE — Patient Instructions (Signed)

## 2015-09-20 ENCOUNTER — Ambulatory Visit: Payer: BLUE CROSS/BLUE SHIELD | Admitting: Physical Medicine & Rehabilitation

## 2015-10-16 ENCOUNTER — Encounter: Payer: Self-pay | Admitting: Family Medicine

## 2015-10-16 ENCOUNTER — Ambulatory Visit (INDEPENDENT_AMBULATORY_CARE_PROVIDER_SITE_OTHER): Payer: BLUE CROSS/BLUE SHIELD | Admitting: Family Medicine

## 2015-10-16 VITALS — BP 128/80 | HR 95 | Temp 98.0°F | Ht 64.75 in | Wt 257.2 lb

## 2015-10-16 DIAGNOSIS — Z6839 Body mass index (BMI) 39.0-39.9, adult: Secondary | ICD-10-CM | POA: Insufficient documentation

## 2015-10-16 DIAGNOSIS — I10 Essential (primary) hypertension: Secondary | ICD-10-CM | POA: Diagnosis not present

## 2015-10-16 DIAGNOSIS — Z0000 Encounter for general adult medical examination without abnormal findings: Secondary | ICD-10-CM

## 2015-10-16 DIAGNOSIS — Z6841 Body Mass Index (BMI) 40.0 and over, adult: Secondary | ICD-10-CM

## 2015-10-16 DIAGNOSIS — E669 Obesity, unspecified: Secondary | ICD-10-CM | POA: Insufficient documentation

## 2015-10-16 LAB — BASIC METABOLIC PANEL
BUN: 12 mg/dL (ref 6–23)
CHLORIDE: 107 meq/L (ref 96–112)
CO2: 26 mEq/L (ref 19–32)
Calcium: 9.3 mg/dL (ref 8.4–10.5)
Creatinine, Ser: 0.77 mg/dL (ref 0.40–1.20)
GFR: 88.82 mL/min (ref >60.00)
Glucose, Bld: 104 mg/dL — ABNORMAL HIGH (ref 70–99)
POTASSIUM: 4 meq/L (ref 3.5–5.1)
Sodium: 141 mEq/L (ref 135–145)

## 2015-10-16 LAB — LIPID PANEL
CHOL/HDL RATIO: 3
Cholesterol: 185 mg/dL (ref 0–200)
HDL: 53.7 mg/dL (ref >39.00)
LDL CALC: 109 mg/dL — AB (ref 0–99)
NONHDL: 130.95
Triglycerides: 109 mg/dL (ref 0.0–149.0)
VLDL: 21.8 mg/dL (ref 0.0–40.0)

## 2015-10-16 LAB — HEMOGLOBIN A1C: HEMOGLOBIN A1C: 5.7 % (ref 4.6–6.5)

## 2015-10-16 NOTE — Progress Notes (Signed)
Pre visit review using our clinic review tool, if applicable. No additional management support is needed unless otherwise documented below in the visit note. 

## 2015-10-16 NOTE — Patient Instructions (Signed)
BEFORE YOU LEAVE: -labs -schedule follow up in 6 months  -We have ordered labs or studies at this visit. It can take up to 1-2 weeks for results and processing. We will contact you with instructions IF your results are abnormal. Normal results will be released to your [facility]. If you have not heard from [place] or can not find your results in [facility] in 2 weeks please contact our office.  We recommend the following healthy lifestyle measures: - eat a healthy whole foods diet consisting of regular small meals composed of vegetables, fruits, beans, nuts, seeds, healthy meats such as white chicken and fish and whole grains.  - avoid sweets, white starchy foods, fried foods, fast food, processed foods, sodas, red meet and other fattening foods.  - get a least 150-300 minutes of aerobic exercise per week.

## 2015-10-16 NOTE — Progress Notes (Signed)
HPI Maria Powers is a pleasant 40 yo F patient whom I inherited from my colleague in 2015. She has Bipolar disorder and is under the care of psychiatrist for this and is on buspar, doxepin, prozac, and lamictal. She sees Dr. Bryson Dames in PMR for various chronic pain complaints and take gabapentin, tramadol and flexeril. She sees her gynecologist for her menorrhagia and women's health issues, has a mirena and just had her gynecological exam. She has hypertension and is on norvasc. Denies CP, SOB, DOE, sedation, AMS. She has chronic migraines, 6-12 per year and reports maxalt is the only thing that has worked for these.  -Concerns and/or follow up today: none  -Diet: diet is so so  -Exercise: no regular exercise  -Taking folic acid, vitamin D or calcium: no  -Diabetes and Dyslipidemia Screening: FASTING for labs today  -Hx of HTN: yes, treated  -Vaccines: UTD  -pap history: does with gyn, reports UTD  -FDLMP: n/a - has mirena  -wants STI testing (Hep C if born [birth year]): wants HIV testing only, declined other  -FH breast, colon or ovarian ca: see FH Last mammogram: n/a, does breast exam with gyn Last colon cancer screening: n/a  -Alcohol, Tobacco, drug use: see social history  Review of Systems - no fevers, unintentional weight loss, vision loss, hearing loss, chest pain, sob, hemoptysis, melena, hematochezia, hematuria, genital discharge, changing or concerning skin lesions, bleeding, bruising, loc, thoughts of self harm or SI  Past Medical History  Diagnosis Date  . Hypertension   . Bipolar 1 disorder (HCC)   . Anxiety   . Depression   . Dysmenorrhea   . Chronic pain     back, hip - seeing PMR  . Obesity     Past Surgical History  Procedure Laterality Date  . Shoulder arthroscopy Right 2007  . Knee arthroscopy Right 2003  . Kidney stone surgery  2007  . Wisdom tooth extraction  Highschool  . Knee arthroscopy Right 2007    Family History  Problem Relation Age  of Onset  . Addison's disease Mother   . Diabetes Mother   . Bipolar disorder Mother   . Fibroids Maternal Aunt     Social History   Social History  . Marital Status: Single    Spouse Name: N/A  . Number of Children: N/A  . Years of Education: N/A   Social History Main Topics  . Smoking status: Former Games developer  . Smokeless tobacco: Never Used  . Alcohol Use: No  . Drug Use: No  . Sexual Activity:    Partners: Male    Birth Control/ Protection: Abstinence, IUD     Comment: Mirena IUD inserted 06-21-15   Other Topics Concern  . 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  amLODipine (NORVASC) 5 MG tablet, Take 1 tablet (5 mg total) by mouth daily., Disp: 90 tablet, Rfl: 3 .  busPIRone (BUSPAR) 10 MG tablet, Take 10 mg by mouth 2 (two) times daily., Disp: , Rfl:  .  Cholecalciferol (D3 DOTS) 2000 UNITS TBDP, Take by mouth., Disp: , Rfl:  .  cyclobenzaprine (FLEXERIL) 10 MG tablet, Take 1 tablet (10 mg total) by mouth 3 (three) times daily., Disp:  90 tablet, Rfl: 7 .  doxepin (SINEQUAN) 10 MG capsule, , Disp: , Rfl: 0 .  ferrous sulfate 325 (65 FE) MG tablet, Take 325 mg by mouth daily with breakfast., Disp: , Rfl:  .  FLUoxetine (PROZAC) 40 MG capsule, Take 40 mg by mouth daily., Disp: , Rfl:  .  gabapentin (NEURONTIN) 100 MG capsule, Take 1 capsule (100 mg total) by mouth 3 (three) times daily., Disp: 90 capsule, Rfl: 3 .  lamoTRIgine (LAMICTAL) 150 MG tablet, Take 150 mg by mouth. 2 tabs at bedtime, Disp: , Rfl:  .  levonorgestrel (MIRENA) 20 MCG/24HR IUD, 1 each by Intrauterine route once., Disp: , Rfl:  .  lithium carbonate 300 MG capsule, Take 300 mg by mouth 2 (two) times daily with a meal. , Disp: , Rfl: 1 .  Magnesium Citrate 100 MG TABS, Take by mouth. Takes 2  tablets at night., Disp: , Rfl:  .  Potassium Gluconate 595 MG CAPS, Take by mouth., Disp: , Rfl:  .  rizatriptan (MAXALT-MLT) 5 MG disintegrating tablet, Take 1 tablet (5 mg total) by mouth as needed., Disp: 10 tablet, Rfl: 3 .  traMADol (ULTRAM) 50 MG tablet, Take 1 tablet (50 mg total) by mouth every 6 (six) hours as needed., Disp: 90 tablet, Rfl: 5  EXAM:  Filed Vitals:   10/16/15 0907  BP: 128/80  Pulse: 95  Temp: 98 F (36.7 C)   Body mass index is 43.11 kg/(m^2).  GENERAL: vitals reviewed and listed below, alert, oriented, appears well hydrated and in no acute distress  HEENT: head atraumatic, PERRLA, normal appearance of eyes, ears, nose and mouth. moist mucus membranes.  NECK: supple, no masses or lymphadenopathy  LUNGS: clear to auscultation bilaterally, no rales, rhonchi or wheeze  CV: HRRR, no peripheral edema or cyanosis, normal pedal pulses  BREAST: normal appearance - no lesions or discharge, on palpation normal breast tissue without any suspicious masses  ABDOMEN: bowel sounds normal, soft, non tender to palpation, no masses, no rebound or guarding  GU: declined, does with gyn  SKIN: declined full exam but agreed to let me look at lesion on chest -  MS: normal gait, moves all extremities normally  NEURO: CN II-XII grossly intact, normal muscle strength and sensation to light touch on extremities  PSYCH: normal affect, pleasant and cooperative  ASSESSMENT AND PLAN:  Discussed the following assessment and plan:  Visit for preventive health examination - Plan: Hemoglobin A1c, Lipid Panel  Essential hypertension, benign - Plan: Basic metabolic panel  BMI 40.0-44.9, adult (HCC)  -Discussed and advised all [place] preventive services health task force level A and B recommendations for age, sex and risks.  -Advised at least 150 minutes of exercise per week and a healthy diet low in saturated fats and sweets and consisting of fresh fruits and vegetables, lean  meats such as fish and white chicken and whole grains.  -FASTING labs, studies and vaccines per orders this encounter  Orders Placed This Encounter  Procedures  . Basic metabolic panel  . Hemoglobin A1c  . Lipid Panel    Patient advised to return to clinic immediately if symptoms worsen or persist or new concerns.  Patient Instructions  BEFORE YOU LEAVE: -labs -schedule follow up in 6 months  -We have ordered labs or studies at this visit. It can take up to 1-2 weeks for results and processing. We will contact you with instructions IF your results are abnormal. Normal results will be released to your [facility]. If  you have not heard from [place] or can not find your results in [facility] in 2 weeks please contact our office.  We recommend the following healthy lifestyle measures: - eat a healthy whole foods diet consisting of regular small meals composed of vegetables, fruits, beans, nuts, seeds, healthy meats such as white chicken and fish and whole grains.  - avoid sweets, white starchy foods, fried foods, fast food, processed foods, sodas, red meet and other fattening foods.  - get a least 150-300 minutes of aerobic exercise per week.           No Follow-up on file.  Kriste Basque R.

## 2015-10-24 ENCOUNTER — Encounter: Payer: Self-pay | Admitting: Physical Medicine & Rehabilitation

## 2015-10-24 ENCOUNTER — Ambulatory Visit (HOSPITAL_BASED_OUTPATIENT_CLINIC_OR_DEPARTMENT_OTHER): Payer: BLUE CROSS/BLUE SHIELD | Admitting: Physical Medicine & Rehabilitation

## 2015-10-24 ENCOUNTER — Encounter: Payer: BLUE CROSS/BLUE SHIELD | Attending: Physical Medicine & Rehabilitation

## 2015-10-24 VITALS — BP 144/86 | HR 93

## 2015-10-24 DIAGNOSIS — M25561 Pain in right knee: Secondary | ICD-10-CM | POA: Insufficient documentation

## 2015-10-24 DIAGNOSIS — M545 Low back pain: Secondary | ICD-10-CM | POA: Diagnosis not present

## 2015-10-24 DIAGNOSIS — M533 Sacrococcygeal disorders, not elsewhere classified: Secondary | ICD-10-CM | POA: Insufficient documentation

## 2015-10-24 MED ORDER — TRAMADOL HCL 50 MG PO TABS: 50.0000 mg | ORAL_TABLET | Freq: Four times a day (QID) | ORAL | Status: DC | PRN

## 2015-10-24 NOTE — Progress Notes (Signed)
  PROCEDURE RECORD [facility]   Name: Maria Powers DOB:1976/06/02 MRN: [id number]  Date:10/24/2015  Physician: Claudette Laws, MD    Nurse/CMA:Shumaker RN  Allergies:  Allergies  Allergen Reactions  . Clindamycin/Lincomycin Nausea And Vomiting  . Nubain [Nalbuphine Hcl] Other (See Comments)    *feels like something crawling on her*    Consent Signed: Yes.    Is patient diabetic? No.  CBG today?  Pregnant: No. LMP: No LMP recorded. Patient is not currently having periods (Reason: IUD). (age 53-55)  Anticoagulants: no Anti-inflammatory: no Antibiotics: no  Procedure:right Sacroiliac steroid injection Position: Prone Start Time: 11:52 End Time:11:56 Fluoro Time: 10 sec  RN/CMA Haematologist RN    Time 11:35 1159    BP 144/86 157/76    Pulse 93 94    Respirations 14 14    O2 Sat 97 98    S/S 6 6    Pain Level 4/10 4/10     D/C home with [name]  , patient A & O X 3, D/C instructions reviewed, and sits independently.

## 2015-10-24 NOTE — Progress Notes (Signed)

## 2015-10-24 NOTE — Patient Instructions (Signed)
Sacroiliac injection was performed today. A combination of a naming medicine plus a cortisone medicine was injected. The injection was done under x-ray guidance. This procedure has been performed to help reduce low back and buttocks pain as well as potentially hip pain. The duration of this injection is variable lasting from hours to  Months. It may repeated if needed. 

## 2016-01-16 ENCOUNTER — Encounter: Payer: BLUE CROSS/BLUE SHIELD | Attending: Physical Medicine & Rehabilitation

## 2016-01-16 ENCOUNTER — Ambulatory Visit (HOSPITAL_BASED_OUTPATIENT_CLINIC_OR_DEPARTMENT_OTHER): Payer: BLUE CROSS/BLUE SHIELD | Admitting: Physical Medicine & Rehabilitation

## 2016-01-16 ENCOUNTER — Encounter: Payer: Self-pay | Admitting: Physical Medicine & Rehabilitation

## 2016-01-16 VITALS — BP 143/82 | HR 84

## 2016-01-16 DIAGNOSIS — M545 Low back pain: Secondary | ICD-10-CM | POA: Diagnosis not present

## 2016-01-16 DIAGNOSIS — M533 Sacrococcygeal disorders, not elsewhere classified: Secondary | ICD-10-CM | POA: Diagnosis not present

## 2016-01-16 DIAGNOSIS — M25561 Pain in right knee: Secondary | ICD-10-CM | POA: Insufficient documentation

## 2016-01-16 DIAGNOSIS — G8929 Other chronic pain: Secondary | ICD-10-CM

## 2016-01-16 NOTE — Progress Notes (Signed)
  PROCEDURE RECORD [facility]   Name: Maria Powers DOB:04/26/1976 MRN: [id number]  Date:01/16/2016  Physician: Claudette LawsAndrew Kirsteins, MD    Nurse/CMA: Shumaker RN  Allergies:  Allergies  Allergen Reactions  . Clindamycin/Lincomycin Nausea And Vomiting  . Nubain [Nalbuphine Hcl] Other (See Comments)    *feels like something crawling on her*    Consent Signed: Yes.    Is patient diabetic? No.  CBG today?   Pregnant: No. LMP: No LMP recorded. Patient is not currently having periods (Reason: IUD). (age 39-55)  Anticoagulants: no Anti-inflammatory: no Antibiotics: no  Procedure: sacroiliac steroid injection Position: Prone Start Time:1:26 End Time: 1:29 Fluoro Time: 12 sec  RN/CMA Haematologisthumaker RN Shumaker RN    Time 1:15 1:35    BP 143/82 151/81    Pulse 84 83    Respirations 16 16    O2 Sat 97 98    S/S 6 6    Pain 4/10 3/10     D/C home with Leonette Mostharles, patient A & O X 3, D/C instructions reviewed, and sits independently.

## 2016-01-16 NOTE — Progress Notes (Signed)

## 2016-01-16 NOTE — Patient Instructions (Signed)
Sacroiliac injection was performed today. A combination of a naming medicine plus a cortisone medicine was injected. The injection was done under x-ray guidance. This procedure has been performed to help reduce low back and buttocks pain as well as potentially hip pain. The duration of this injection is variable lasting from hours to  Months. It may repeated if needed. 

## 2016-01-30 ENCOUNTER — Ambulatory Visit: Payer: BLUE CROSS/BLUE SHIELD | Admitting: Physical Medicine & Rehabilitation

## 2016-02-11 ENCOUNTER — Encounter: Payer: Self-pay | Admitting: Family Medicine

## 2016-02-11 ENCOUNTER — Ambulatory Visit (INDEPENDENT_AMBULATORY_CARE_PROVIDER_SITE_OTHER): Payer: BLUE CROSS/BLUE SHIELD | Admitting: Family Medicine

## 2016-02-11 VITALS — BP 122/82 | HR 97 | Temp 98.1°F | Ht 64.75 in | Wt 249.6 lb

## 2016-02-11 DIAGNOSIS — R300 Dysuria: Secondary | ICD-10-CM | POA: Diagnosis not present

## 2016-02-11 LAB — POCT URINALYSIS DIPSTICK
Bilirubin, UA: NEGATIVE
Glucose, UA: NEGATIVE
Ketones, UA: NEGATIVE
LEUKOCYTES UA: NEGATIVE
NITRITE UA: NEGATIVE
PH UA: 7
PROTEIN UA: NEGATIVE
RBC UA: NEGATIVE
Spec Grav, UA: 1.01
UROBILINOGEN UA: 0.2

## 2016-02-11 NOTE — Addendum Note (Signed)
Addended by: Johnella MoloneyFUNDERBURK, JO A on: 02/11/2016 08:43 AM   Modules accepted: Orders

## 2016-02-11 NOTE — Progress Notes (Signed)
Pre visit review using our clinic review tool, if applicable. No additional management support is needed unless otherwise documented below in the visit note. 

## 2016-02-11 NOTE — Patient Instructions (Addendum)
BEFORE YOU LEAVE: -follow up: 2 months   Drink plenty of water.  We will check a culture and will let you know if this shows bacteria.  Can use AZO, this is available over-the-counter if needed.  Please follow up if symptoms persist or new symptoms develop.

## 2016-02-11 NOTE — Progress Notes (Signed)
HPI:  Acute visit for:  Dysuria: -started yesterday -symptoms include urgency, frequency, dysuria -denies: flank pain, hematuria, vaginal discharge, fevers, nausea, vomiting, diarrhea -Reports remote history of UTI and this feels similar  ROS: See pertinent positives and negatives per HPI.  Past Medical History  Diagnosis Date  . Hypertension   . Bipolar 1 disorder (HCC)   . Anxiety   . Depression   . Dysmenorrhea   . Chronic pain     back, hip - seeing PMR  . Obesity     Past Surgical History  Procedure Laterality Date  . Shoulder arthroscopy Right 2007  . Knee arthroscopy Right 2003  . Kidney stone surgery  2007  . Wisdom tooth extraction  Highschool  . Knee arthroscopy Right 2007    Family History  Problem Relation Age of Onset  . Addison's disease Mother   . Diabetes Mother   . Bipolar disorder Mother   . Fibroids Maternal Aunt     Social History   Social History  . Marital Status: Single    Spouse Name: N/A  . Number of Children: N/A  . Years of Education: N/A   Social History Main Topics  . Smoking status: Former Games developermoker  . Smokeless tobacco: Never Used  . Alcohol Use: No  . Drug Use: No  . Sexual Activity:    Partners: Male    Birth Control/ Protection: Abstinence, IUD     Comment: Mirena IUD inserted 06-21-15   Other Topics Concern  . None   Social History Narrative   Work or School: Pharmacist, hospitalcustomers service dry cleaning      Home Situation: roommate      Spiritual Beliefs:mormon      Lifestyle: no regular exercise; horrible diet              Current outpatient prescriptions:  .  amLODipine (NORVASC) 5 MG tablet, Take 1 tablet (5 mg total) by mouth daily., Disp: 90 tablet, Rfl: 3 .  busPIRone (BUSPAR) 10 MG tablet, Take 10 mg by mouth 2 (two) times daily., Disp: , Rfl:  .  Cholecalciferol (D3 DOTS) 2000 UNITS TBDP, Take by mouth., Disp: , Rfl:  .  cyclobenzaprine (FLEXERIL) 10 MG tablet, Take 1 tablet (10 mg total) by mouth 3 (three)  times daily., Disp: 90 tablet, Rfl: 7 .  doxepin (SINEQUAN) 10 MG capsule, , Disp: , Rfl: 0 .  ferrous sulfate 325 (65 FE) MG tablet, Take 325 mg by mouth daily with breakfast., Disp: , Rfl:  .  FLUoxetine (PROZAC) 40 MG capsule, Take 40 mg by mouth daily., Disp: , Rfl:  .  gabapentin (NEURONTIN) 100 MG capsule, Take 1 capsule (100 mg total) by mouth 3 (three) times daily., Disp: 90 capsule, Rfl: 3 .  lamoTRIgine (LAMICTAL) 150 MG tablet, Take 150 mg by mouth. 2 tabs at bedtime, Disp: , Rfl:  .  levonorgestrel (MIRENA) 20 MCG/24HR IUD, 1 each by Intrauterine route once., Disp: , Rfl:  .  lithium carbonate 300 MG capsule, Take 300 mg by mouth 2 (two) times daily with a meal. , Disp: , Rfl: 1 .  Magnesium Citrate 100 MG TABS, Take by mouth. Takes 2 tablets at night., Disp: , Rfl:  .  Potassium Gluconate 595 MG CAPS, Take by mouth., Disp: , Rfl:  .  rizatriptan (MAXALT-MLT) 5 MG disintegrating tablet, Take 1 tablet (5 mg total) by mouth as needed., Disp: 10 tablet, Rfl: 3 .  traMADol (ULTRAM) 50 MG tablet, Take 1 tablet (50 mg  total) by mouth every 6 (six) hours as needed., Disp: 90 tablet, Rfl: 5  EXAM:  Filed Vitals:   02/11/16 0817  BP: 122/82  Pulse: 97  Temp: 98.1 F (36.7 C)    Body mass index is 41.84 kg/(m^2).  GENERAL: vitals reviewed and listed above, alert, oriented, appears well hydrated and in no acute distress  HEENT: atraumatic, conjunttiva clear, no obvious abnormalities on inspection of external nose and ears  NECK: no obvious masses on inspection  LUNGS: clear to auscultation bilaterally, no wheezes, rales or rhonchi, good air movement  CV: HRRR, no peripheral edema  MS: moves all extremities without noticeable abnormality  PSYCH: pleasant and cooperative, no obvious depression or anxiety  ASSESSMENT AND PLAN:  Discussed the following assessment and plan:  Dysuria - Plan: POC Urinalysis Dipstick  -udip ok, culture pending -fluids, azo, f/u if symptoms  persist or new symptoms -follow up in 2 months -Patient advised to return or notify a doctor immediately if symptoms worsen or persist or new concerns arise.  Patient Instructions  BEFORE YOU LEAVE: -follow up: 2 months      Welles Walthall R.

## 2016-02-13 LAB — URINE CULTURE
Colony Count: NO GROWTH
Organism ID, Bacteria: NO GROWTH

## 2016-03-24 ENCOUNTER — Other Ambulatory Visit: Payer: Self-pay | Admitting: Physical Medicine & Rehabilitation

## 2016-04-09 ENCOUNTER — Ambulatory Visit (HOSPITAL_BASED_OUTPATIENT_CLINIC_OR_DEPARTMENT_OTHER): Payer: BLUE CROSS/BLUE SHIELD | Admitting: Physical Medicine & Rehabilitation

## 2016-04-09 ENCOUNTER — Encounter: Payer: BLUE CROSS/BLUE SHIELD | Attending: Physical Medicine & Rehabilitation

## 2016-04-09 ENCOUNTER — Encounter: Payer: Self-pay | Admitting: Physical Medicine & Rehabilitation

## 2016-04-09 VITALS — BP 143/94 | HR 95 | Resp 14

## 2016-04-09 DIAGNOSIS — M533 Sacrococcygeal disorders, not elsewhere classified: Secondary | ICD-10-CM | POA: Insufficient documentation

## 2016-04-09 DIAGNOSIS — M79604 Pain in right leg: Secondary | ICD-10-CM | POA: Insufficient documentation

## 2016-04-09 DIAGNOSIS — G43909 Migraine, unspecified, not intractable, without status migrainosus: Secondary | ICD-10-CM | POA: Diagnosis not present

## 2016-04-09 DIAGNOSIS — N920 Excessive and frequent menstruation with regular cycle: Secondary | ICD-10-CM | POA: Insufficient documentation

## 2016-04-09 DIAGNOSIS — M545 Low back pain: Secondary | ICD-10-CM | POA: Insufficient documentation

## 2016-04-09 MED ORDER — GABAPENTIN 100 MG PO CAPS: 100.0000 mg | ORAL_CAPSULE | Freq: Three times a day (TID) | ORAL | Status: DC

## 2016-04-09 MED ORDER — CYCLOBENZAPRINE HCL 10 MG PO TABS: 10.0000 mg | ORAL_TABLET | Freq: Three times a day (TID) | ORAL | Status: DC

## 2016-04-09 MED ORDER — TRAMADOL HCL 50 MG PO TABS: 50.0000 mg | ORAL_TABLET | Freq: Four times a day (QID) | ORAL | Status: DC | PRN

## 2016-04-09 NOTE — Progress Notes (Signed)
  PROCEDURE RECORD [place] [facility]   Name: Maria Powers DOB:03/13/1976 MRN: [id number]  Date:04/09/2016  Physician: Claudette LawsAndrew Kirsteins, MD    Nurse/CMA: Lucretia RoersWood, CMA  Allergies:  Allergies  Allergen Reactions  . Clindamycin/Lincomycin Nausea And Vomiting  . Nubain [Nalbuphine Hcl] Other (See Comments)    *feels like something crawling on her*    Consent Signed: Yes.    Is patient diabetic? No.  CBG today? n/a  Pregnant: No. LMP: No LMP recorded. Patient is not currently having periods (Reason: IUD). (age 39-55)  Anticoagulants: no Anti-inflammatory: no Antibiotics: no  Procedure: right sacroiliac steroid injection  Position: Prone Start Time: 1334  End Time: 1336   Fluoro Time: 10   RN/CMA Tamsin Nader, CMA Wood, CMA    Time 1:05pm 1340    BP 143/94 140/92    Pulse 95 91    Respirations 14 16    O2 Sat 96 97    S/S 6 6    Pain Level 4/10 4/10     D/C home with [name] (roommate), patient A & O X 3, D/C instructions reviewed, and sits independently.

## 2016-04-09 NOTE — Progress Notes (Signed)

## 2016-07-16 ENCOUNTER — Encounter: Payer: BLUE CROSS/BLUE SHIELD | Attending: Physical Medicine & Rehabilitation

## 2016-07-16 ENCOUNTER — Ambulatory Visit (HOSPITAL_BASED_OUTPATIENT_CLINIC_OR_DEPARTMENT_OTHER): Payer: BLUE CROSS/BLUE SHIELD | Admitting: Physical Medicine & Rehabilitation

## 2016-07-16 VITALS — BP 142/92 | HR 95

## 2016-07-16 DIAGNOSIS — G8929 Other chronic pain: Secondary | ICD-10-CM | POA: Diagnosis not present

## 2016-07-16 DIAGNOSIS — M791 Myalgia: Secondary | ICD-10-CM | POA: Insufficient documentation

## 2016-07-16 DIAGNOSIS — M545 Low back pain: Secondary | ICD-10-CM | POA: Insufficient documentation

## 2016-07-16 DIAGNOSIS — M533 Sacrococcygeal disorders, not elsewhere classified: Secondary | ICD-10-CM

## 2016-07-16 MED ORDER — TRAMADOL HCL 50 MG PO TABS: 50.0000 mg | ORAL_TABLET | Freq: Four times a day (QID) | ORAL | 5 refills | Status: DC | PRN

## 2016-07-16 MED ORDER — GABAPENTIN 100 MG PO CAPS: 100.0000 mg | ORAL_CAPSULE | Freq: Three times a day (TID) | ORAL | 3 refills | Status: DC

## 2016-07-16 NOTE — Progress Notes (Signed)
  PROCEDURE RECORD [facility]   Name: Maria Powers DOB:03/14/1976 MRN: [id number]  Date:07/16/2016  Physician: Claudette LawsAndrew Kirsteins, MD    Nurse/CMA:Michalene Debruler Bing PlumeHaynes  Allergies:  Allergies  Allergen Reactions  . Clindamycin/Lincomycin Nausea And Vomiting  . Nubain [Nalbuphine Hcl] Other (See Comments)    *feels like something crawling on her*    Consent Signed: Yes.    Is patient diabetic? No.  CBG today?  Pregnant: No. LMP: No LMP recorded. Patient is not currently having periods (Reason: IUD). (age 39-55)  Anticoagulants: no Anti-inflammatory: no Antibiotics: no  Procedure: Right Sacroiliac Injection Position: Prone Start Time: 1119 End Time: 1122 Fluoro Time: 13  RN/CMA Gillermo MurdochHaynes Izabellah Dadisman    Time 1105 1126    BP 142/92 143/96    Pulse 95 97    Respirations 14 14    O2 Sat 96 95    S/S 6 6    Pain Level 4 4     D/C home with Nephew, patient A & O X 3, D/C instructions reviewed, and sits independently.

## 2016-07-16 NOTE — Progress Notes (Signed)

## 2016-07-16 NOTE — Patient Instructions (Signed)
Sacroiliac injection was performed today. A combination of a naming medicine plus a cortisone medicine was injected. The injection was done under x-ray guidance. This procedure has been performed to help reduce low back and buttocks pain as well as potentially hip pain. The duration of this injection is variable lasting from hours to  Months. It may repeated if needed. 

## 2016-08-18 ENCOUNTER — Encounter: Payer: Self-pay | Admitting: Family Medicine

## 2016-08-18 ENCOUNTER — Ambulatory Visit (INDEPENDENT_AMBULATORY_CARE_PROVIDER_SITE_OTHER): Payer: BLUE CROSS/BLUE SHIELD | Admitting: Family Medicine

## 2016-08-18 VITALS — BP 124/74 | HR 95 | Temp 98.5°F | Ht 64.75 in | Wt 251.1 lb

## 2016-08-18 DIAGNOSIS — I10 Essential (primary) hypertension: Secondary | ICD-10-CM | POA: Diagnosis not present

## 2016-08-18 DIAGNOSIS — M25531 Pain in right wrist: Secondary | ICD-10-CM

## 2016-08-18 DIAGNOSIS — R739 Hyperglycemia, unspecified: Secondary | ICD-10-CM | POA: Diagnosis not present

## 2016-08-18 DIAGNOSIS — Z6841 Body Mass Index (BMI) 40.0 and over, adult: Secondary | ICD-10-CM

## 2016-08-18 LAB — BASIC METABOLIC PANEL
BUN: 12 mg/dL (ref 6–23)
CALCIUM: 9.9 mg/dL (ref 8.4–10.5)
CHLORIDE: 105 meq/L (ref 96–112)
CO2: 26 meq/L (ref 19–32)
CREATININE: 0.81 mg/dL (ref 0.40–1.20)
GFR: 83.41 mL/min (ref >60.00)
Glucose, Bld: 79 mg/dL (ref 70–99)
Potassium: 3.9 mEq/L (ref 3.5–5.1)
SODIUM: 138 meq/L (ref 135–145)

## 2016-08-18 LAB — HEMOGLOBIN A1C: Hgb A1c MFr Bld: 5.6 % (ref 4.6–6.5)

## 2016-08-18 NOTE — Progress Notes (Signed)
HPI:  Follow up:  Acute issue R wrist/thumb pain: -started about 5 days ago the day after she did a lot of safety pin clipping -pain in the R thenar eminence and R radial wrist -R handed -no weakness, numbness, fevers, malaise -doing better with ace bandage  HTN/obesity/hyperglycemia: -taking norvasc -no cp, sob, doe, swelling -no regular exercise -deit so so  Bipolar disorder: -managed by her psychiatrist -reports stable and well controlled  ROS: See pertinent positives and negatives per HPI.  Past Medical History:  Diagnosis Date  . Anxiety   . Bipolar 1 disorder (HCC)   . Chronic pain    back, hip - seeing PMR  . Depression   . Dysmenorrhea   . Hypertension   . Obesity     Past Surgical History:  Procedure Laterality Date  . KIDNEY STONE SURGERY  2007  . KNEE ARTHROSCOPY Right 2003  . KNEE ARTHROSCOPY Right 2007  . SHOULDER ARTHROSCOPY Right 2007  . WISDOM TOOTH EXTRACTION  Highschool    Family History  Problem Relation Age of Onset  . Addison's disease Mother   . Diabetes Mother   . Bipolar disorder Mother   . Fibroids Maternal Aunt     Social History   Social History  . Marital status: Single    Spouse name: N/A  . Number of children: N/A  . Years of education: N/A   Social History Main Topics  . Smoking status: Former Games developermoker  . Smokeless tobacco: Never Used  . Alcohol use No  . Drug use: No  . Sexual activity: Not Currently    Partners: Male    Birth control/ protection: Abstinence, IUD     Comment: Mirena IUD inserted 06-21-15   Other Topics Concern  . None   Social History Narrative   Work or School: Pharmacist, hospitalcustomers service dry cleaning      Home Situation: roommate      Spiritual Beliefs:mormon      Lifestyle: no regular exercise; horrible diet              Current Outpatient Prescriptions:  .  amLODipine (NORVASC) 5 MG tablet, Take 1 tablet (5 mg total) by mouth daily., Disp: 90 tablet, Rfl: 3 .  busPIRone (BUSPAR) 10 MG  tablet, Take 20 mg by mouth 2 (two) times daily. , Disp: , Rfl:  .  cyclobenzaprine (FLEXERIL) 10 MG tablet, Take 1 tablet (10 mg total) by mouth 3 (three) times daily., Disp: 90 tablet, Rfl: 7 .  doxepin (SINEQUAN) 10 MG capsule, , Disp: , Rfl: 0 .  FLUoxetine (PROZAC) 40 MG capsule, Take 40 mg by mouth daily., Disp: , Rfl:  .  gabapentin (NEURONTIN) 100 MG capsule, Take 1 capsule (100 mg total) by mouth 3 (three) times daily., Disp: 90 capsule, Rfl: 3 .  lamoTRIgine (LAMICTAL) 150 MG tablet, Take 150 mg by mouth. 2 tabs at bedtime, Disp: , Rfl:  .  levonorgestrel (MIRENA) 20 MCG/24HR IUD, 1 each by Intrauterine route once., Disp: , Rfl:  .  lithium carbonate 300 MG capsule, Take 300 mg by mouth 2 (two) times daily with a meal. , Disp: , Rfl: 1 .  rizatriptan (MAXALT-MLT) 5 MG disintegrating tablet, Take 1 tablet (5 mg total) by mouth as needed., Disp: 10 tablet, Rfl: 3 .  traMADol (ULTRAM) 50 MG tablet, Take 1 tablet (50 mg total) by mouth every 6 (six) hours as needed., Disp: 90 tablet, Rfl: 5  EXAM:  Vitals:   08/18/16 1054  BP: 124/74  Pulse: 95  Temp: 98.5 F (36.9 C)    Body mass index is 42.11 kg/m.  GENERAL: vitals reviewed and listed above, alert, oriented, appears well hydrated and in no acute distress  HEENT: atraumatic, conjunttiva clear, no obvious abnormalities on inspection of external nose and ears  NECK: no obvious masses on inspection  LUNGS: clear to auscultation bilaterally, no wheezes, rales or rhonchi, good air movement  CV: HRRR, no peripheral edema  MS: moves all extremities without noticeable abnormality, normal inspection of both hands and wrists and forearms, tenderness to palpation in the right thenar eminence flexor tendons of the forearm, normal sensitivity light touch and strength in the hand and wrist bilaterally, neurovascularly intact distally  PSYCH: pleasant and cooperative, no obvious depression or anxiety  ASSESSMENT AND PLAN:  Discussed  the following assessment and plan:  Essential hypertension - Plan: Basic metabolic panel, CBC (no diff)  BMI 40.0-44.9, adult (HCC)  Hyperglycemia - Plan: Hemoglobin A1c  Right wrist pain  -Labs today, not fasting and not due for lipid check -Cock up wrist splint for it the likely tendinitis/carpal tunnel issues in the right hand; advised to follow-up if symptoms persist -Lifestyle recommendations -Follow up in 3-4 months -Patient advised to return or notify a doctor immediately if symptoms worsen or persist or new concerns arise.  Patient Instructions  BEFORE YOU LEAVE: -follow up: 3-4 months -labs  Try the [facility] hand/wrist support for your hand and wrist pain. Wear this every night - during the day if comfortable. Follow up if symptoms persist in 2-3 weeks or are worsening.  We have ordered labs or studies at this visit. It can take up to 1-2 weeks for results and processing. IF results require follow up or explanation, we will call you with instructions. Clinically stable results will be released to your [facility]. If you have not heard from us or cannot find your results in [facility] in 2 weeks please contact our office at [phone].   If you are not yet signed up for [facility], please SIGN UP TODAY. We now offer online scheduling, same day appointments and extended hours. WHEN YOU DON'T FEEL YOUR BEST.Marland Kitchen.Marland Kitchen.WE ARE HERE TO HELP.   We recommend the following healthy lifestyle for LIFE: 1) Small portions.   Tip: eat off of a salad plate instead of a dinner plate.  Tip: if you need more or a snack choose fruits, veggies and/or a handful of nuts or seeds.  2) Eat a healthy clean diet.  * Tip: Avoid (less then 1 serving per week): processed foods, sweets, sweetened drinks, white starches (rice, flour, bread, potatoes, pasta, etc), red meat, fast foods, butter  *Tip: CHOOSE instead   * 5-9 servings per day of fresh or frozen fruits and vegetables (but not corn, potatoes, bananas,  canned or dried fruit)   *nuts and seeds, beans   *olives and olive oil   *small portions of lean meats such as fish and white chicken    *small portions of whole grains  3)Get at least 150 minutes of sweaty aerobic exercise per week.  4)Reduce stress - consider counseling, meditation and relaxation to balance other aspects of your life.             Kriste BasqueKIM, Sarthak Rubenstein R., DO

## 2016-08-18 NOTE — Progress Notes (Signed)
Pre visit review using our clinic review tool, if applicable. No additional management support is needed unless otherwise documented below in the visit note. 

## 2016-08-18 NOTE — Patient Instructions (Signed)
BEFORE YOU LEAVE: -follow up: 3-4 months -labs  Try the [facility] hand/wrist support for your hand and wrist pain. Wear this every night - during the day if comfortable. Follow up if symptoms persist in 2-3 weeks or are worsening.  We have ordered labs or studies at this visit. It can take up to 1-2 weeks for results and processing. IF results require follow up or explanation, we will call you with instructions. Clinically stable results will be released to your [facility]. If you have not heard from us or cannot find your results in [facility] in 2 weeks please contact our office at [phone].   If you are not yet signed up for [facility], please SIGN UP TODAY. We now offer online scheduling, same day appointments and extended hours. WHEN YOU DON'T FEEL YOUR BEST.Marland Kitchen.Marland Kitchen.WE ARE HERE TO HELP.   We recommend the following healthy lifestyle for LIFE: 1) Small portions.   Tip: eat off of a salad plate instead of a dinner plate.  Tip: if you need more or a snack choose fruits, veggies and/or a handful of nuts or seeds.  2) Eat a healthy clean diet.  * Tip: Avoid (less then 1 serving per week): processed foods, sweets, sweetened drinks, white starches (rice, flour, bread, potatoes, pasta, etc), red meat, fast foods, butter  *Tip: CHOOSE instead   * 5-9 servings per day of fresh or frozen fruits and vegetables (but not corn, potatoes, bananas, canned or dried fruit)   *nuts and seeds, beans   *olives and olive oil   *small portions of lean meats such as fish and white chicken    *small portions of whole grains  3)Get at least 150 minutes of sweaty aerobic exercise per week.  4)Reduce stress - consider counseling, meditation and relaxation to balance other aspects of your life.

## 2016-08-19 LAB — CBC
HCT: 40.2 % (ref 36.0–46.0)
Hemoglobin: 13.5 g/dL (ref 12.0–15.0)
MCHC: 33.6 g/dL (ref 30.0–36.0)
MCV: 83.3 fl (ref 78.0–100.0)
PLATELETS: 304 10*3/uL (ref 150.0–400.0)
RBC: 4.82 Mil/uL (ref 3.87–5.11)
RDW: 13 % (ref 11.5–15.5)
WBC: 8.2 10*3/uL (ref 4.0–10.5)

## 2016-08-20 ENCOUNTER — Telehealth: Payer: Self-pay | Admitting: Family Medicine

## 2016-08-20 MED ORDER — AMLODIPINE BESYLATE 5 MG PO TABS: 5.0000 mg | ORAL_TABLET | Freq: Every day | ORAL | 1 refills | Status: DC

## 2016-08-20 NOTE — Telephone Encounter (Signed)
Pt request refill  amLODipine (NORVASC) 5 MG tablet  Pt saw dr Selena Battenkim.  [facility]

## 2016-08-20 NOTE — Telephone Encounter (Signed)
Rx done. 

## 2016-10-15 ENCOUNTER — Ambulatory Visit: Payer: BLUE CROSS/BLUE SHIELD | Admitting: Physical Medicine & Rehabilitation

## 2016-10-23 IMAGING — DX DG KNEE 1-2V*R*
2 series · 2 of 2 positions shown · non-contrast
Comparison: None.

CLINICAL DATA: Right knee pain

EXAM:
RIGHT KNEE - 1-2 VIEW

[knee ap]
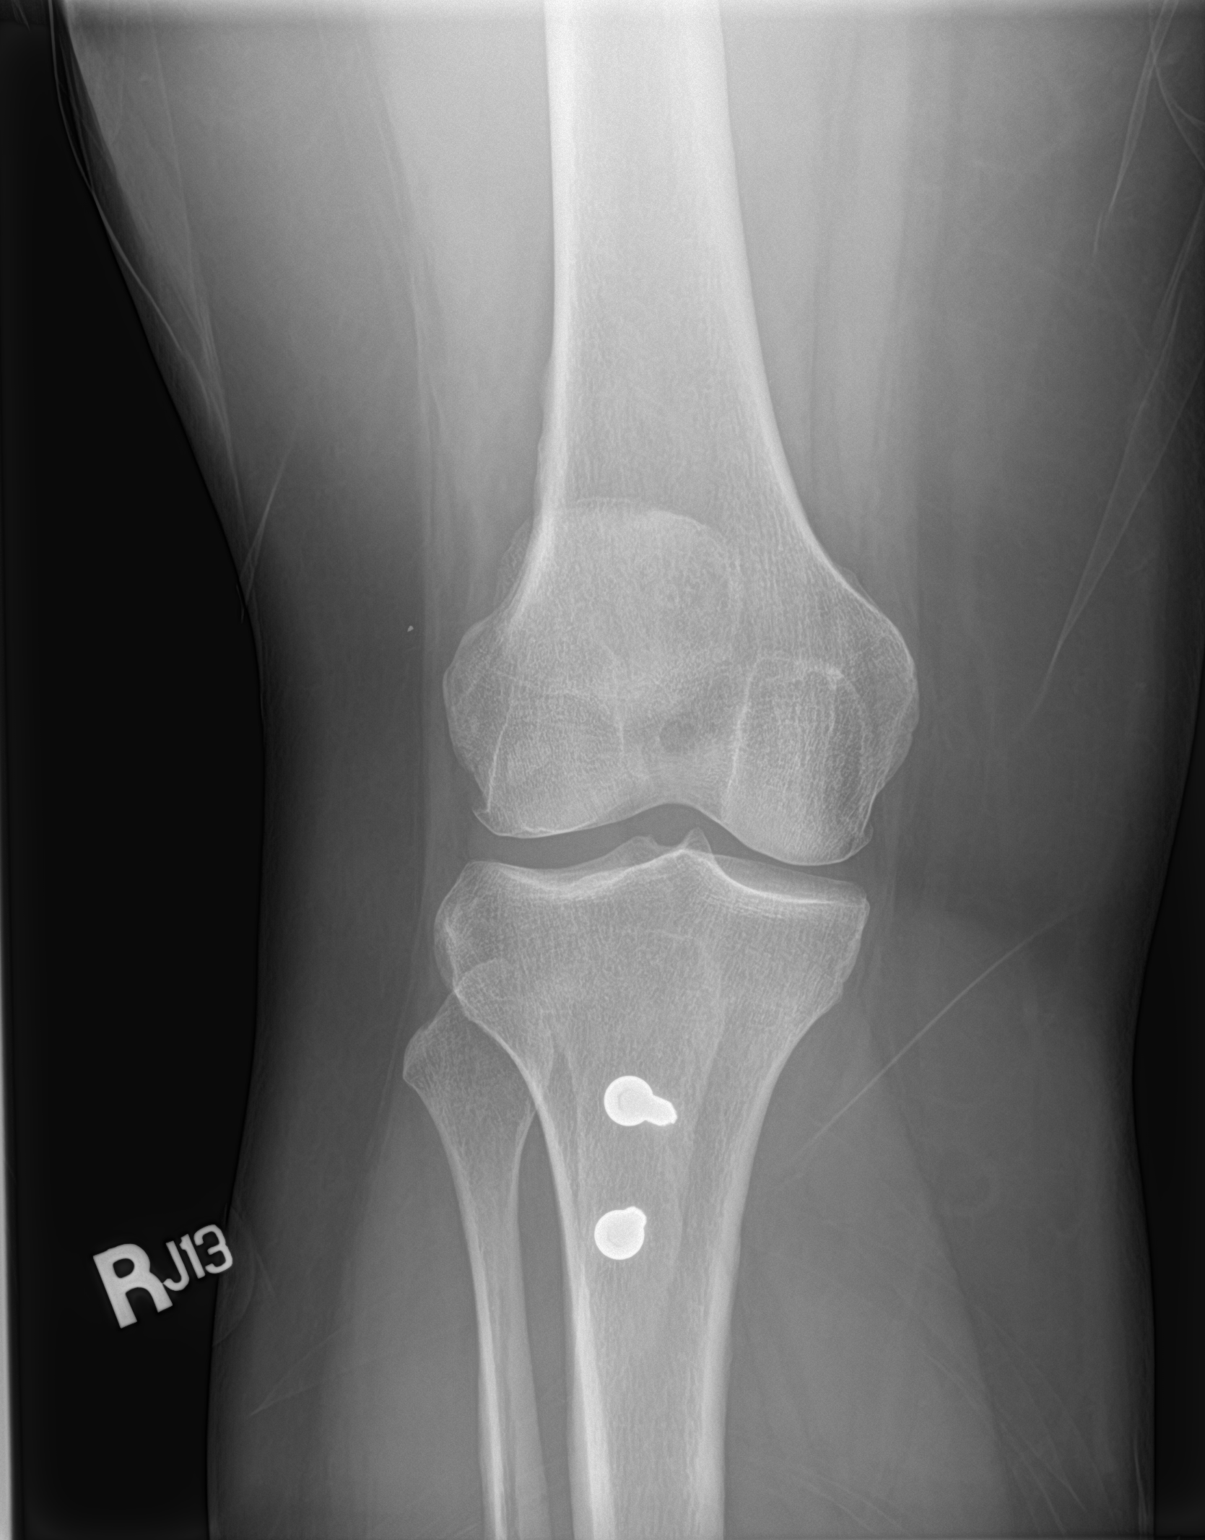

[knee lat]
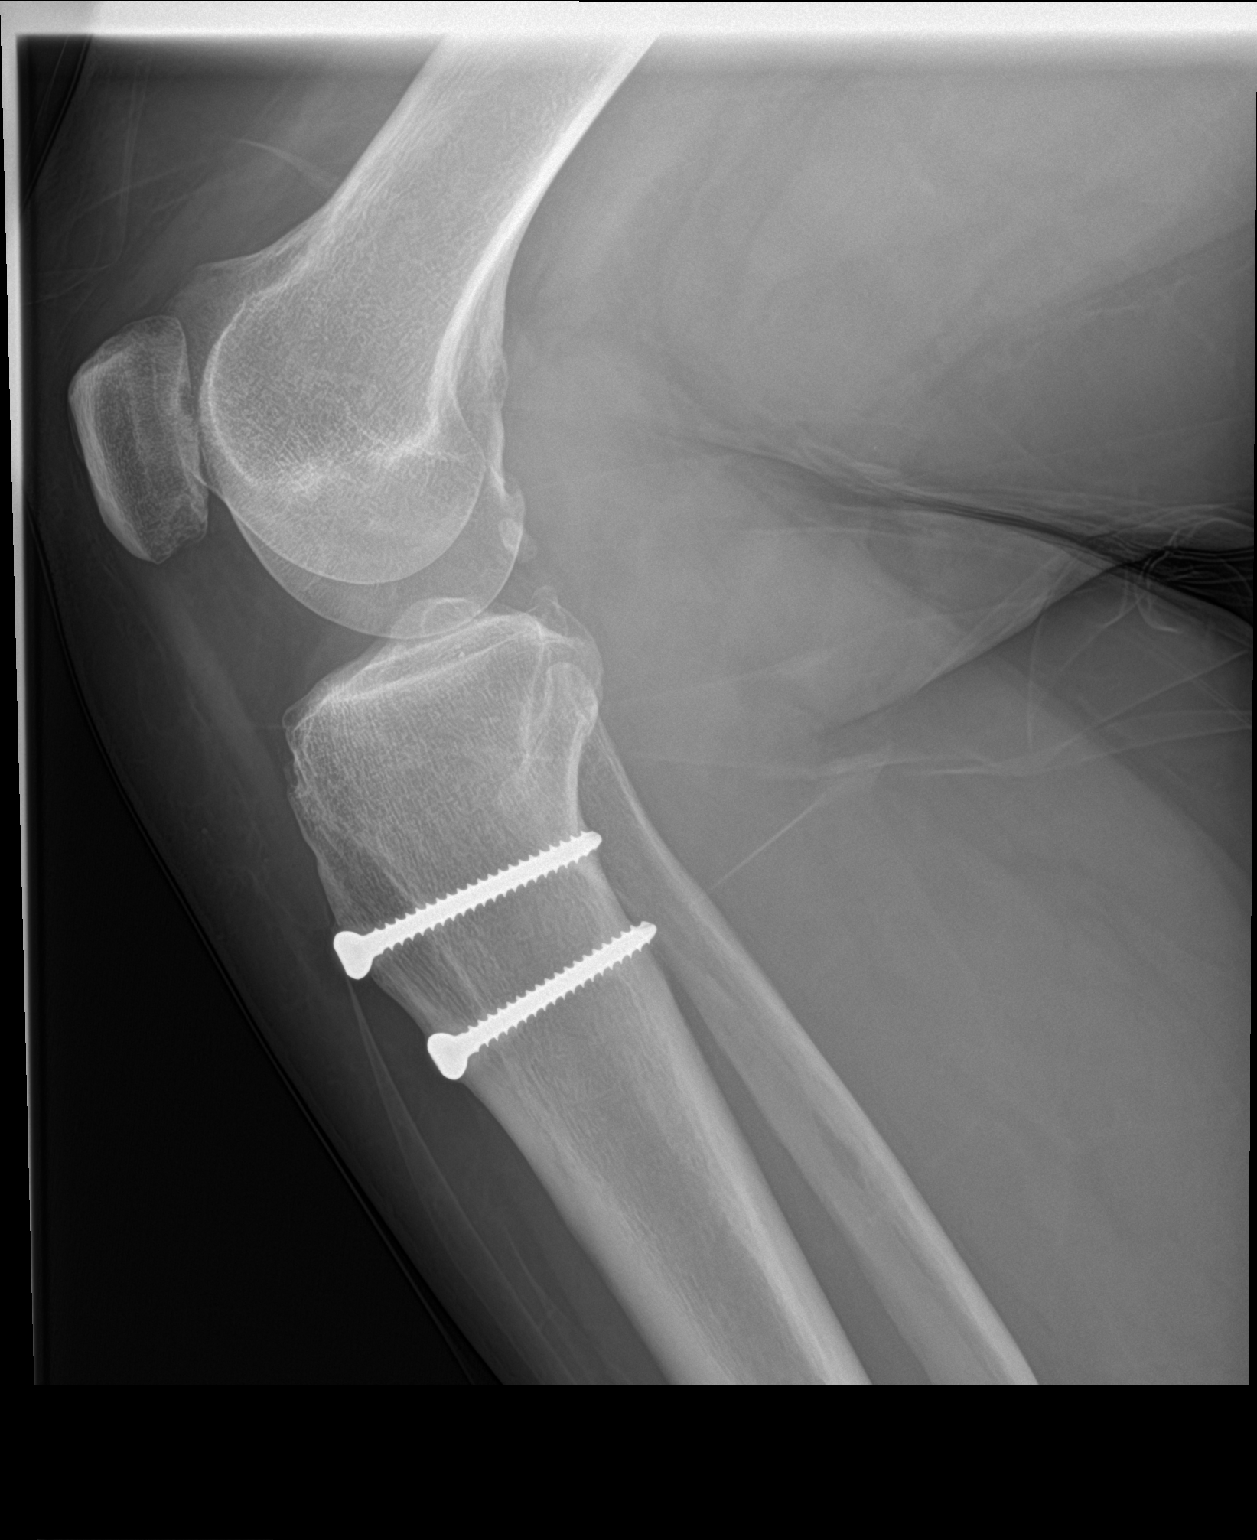

[2 of 2 positions shown; findings below may reference images not displayed]

FINDINGS: There is no evidence of fracture, dislocation, or joint effusion.
There are 2 fully threaded screws transfixing the tibial tuberosity
without failure or complication. Possible small osteochondral lesion
involving the medial patellar facet. The joint spaces are relatively
well maintained on a nonweightbearing view. Soft tissues are
unremarkable.
IMPRESSION: No acute osseous injury of the right knee.

Possible small osteochondral lesion involving the medial patellar
facet.

## 2016-10-23 IMAGING — DX DG HIP (WITH OR WITHOUT PELVIS) 2V BILAT
5 series · 5 of 5 positions shown · non-contrast
Comparison: None.

CLINICAL DATA: Three days ago woke up with severe bilateral hip
pain, no injury.

EXAM:
DG HIP (WITH OR WITHOUT PELVIS) 2V BILAT

[pelvis ap]
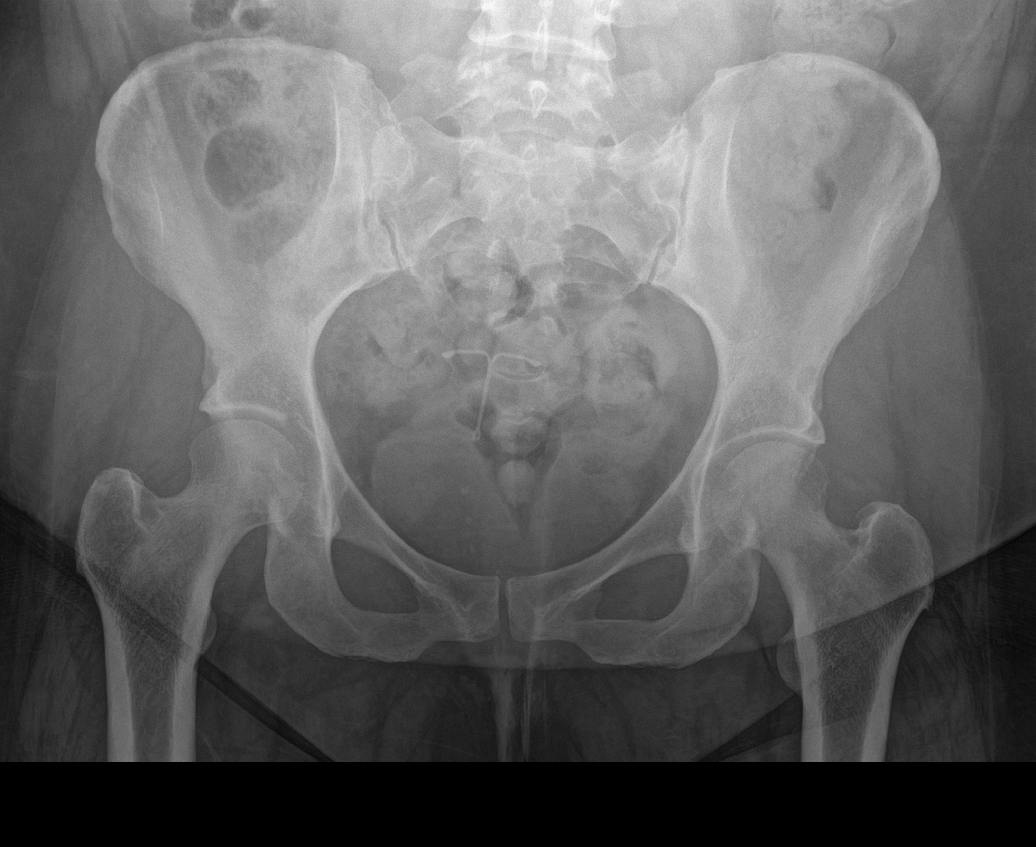

[hip ap (1 of 2)]
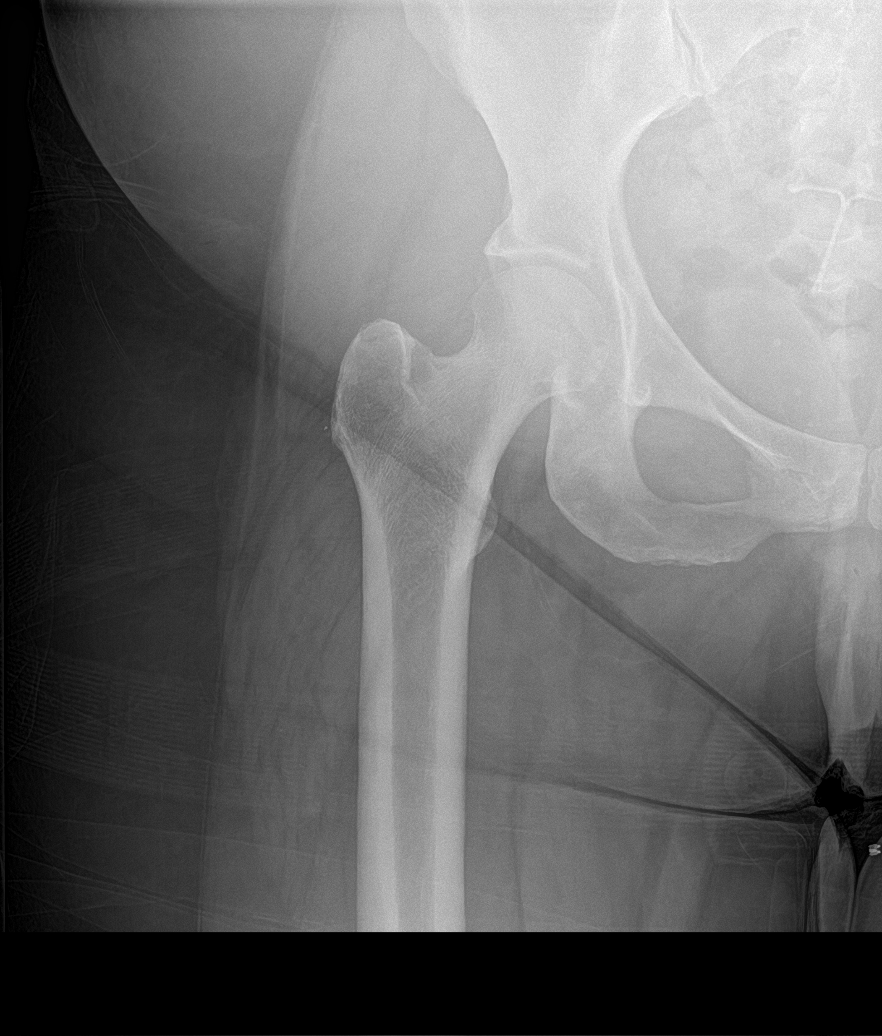

[hip lat (1 of 2)]
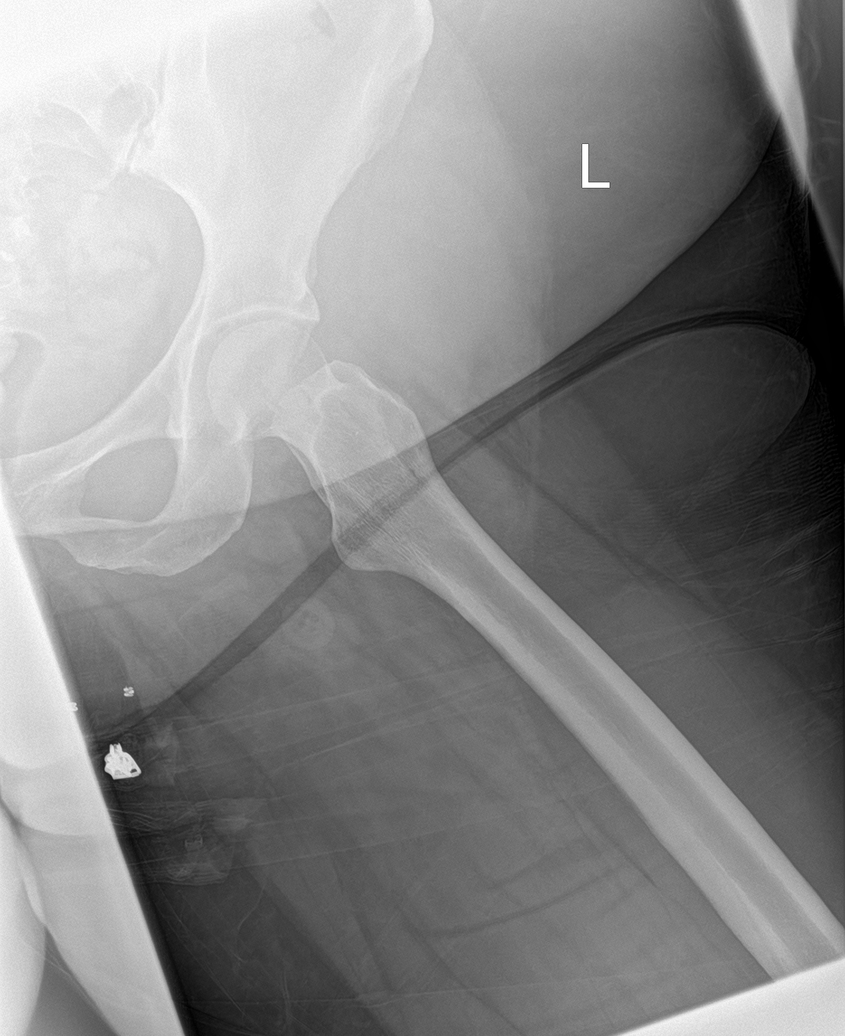

[hip ap (2 of 2)]
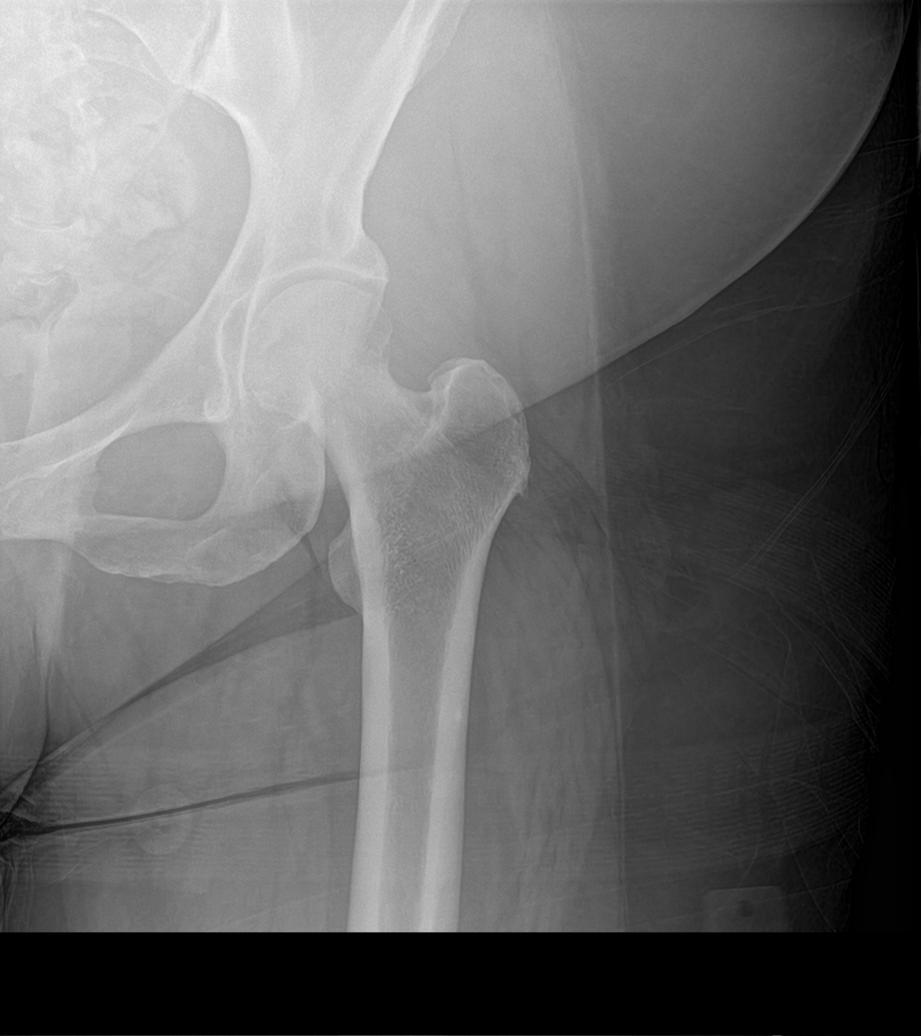

[hip lat (2 of 2)]
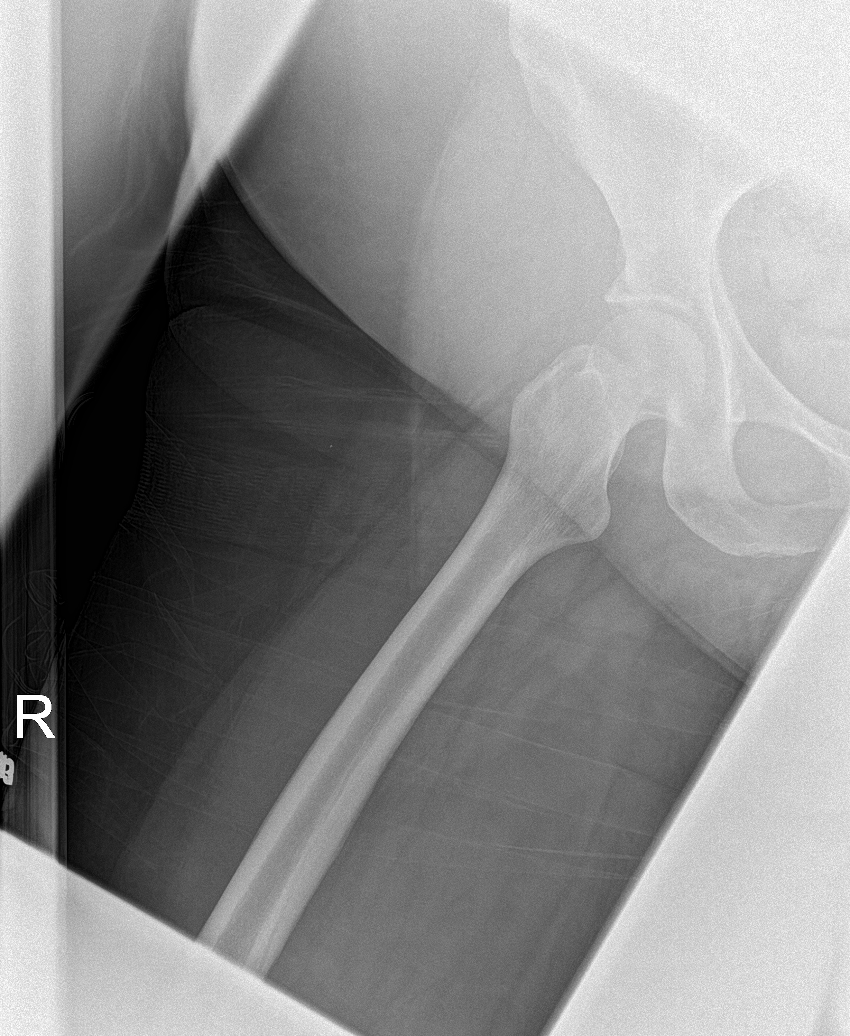

[5 of 5 positions shown; findings below may reference images not displayed]

FINDINGS: Hip joint space is maintained bilaterally. No degenerative changes
in the hips. Obturator rings are intact. Intrauterine contraceptive
device is incidentally noted in the central anatomic pelvis.
IMPRESSION: No findings to explain the patient's pain.

## 2016-11-12 ENCOUNTER — Encounter: Payer: BLUE CROSS/BLUE SHIELD | Attending: Physical Medicine & Rehabilitation

## 2016-11-12 ENCOUNTER — Ambulatory Visit (HOSPITAL_BASED_OUTPATIENT_CLINIC_OR_DEPARTMENT_OTHER): Payer: BLUE CROSS/BLUE SHIELD | Admitting: Physical Medicine & Rehabilitation

## 2016-11-12 ENCOUNTER — Encounter: Payer: Self-pay | Admitting: Physical Medicine & Rehabilitation

## 2016-11-12 VITALS — BP 132/86 | HR 93

## 2016-11-12 DIAGNOSIS — M545 Low back pain: Secondary | ICD-10-CM | POA: Diagnosis not present

## 2016-11-12 DIAGNOSIS — R52 Pain, unspecified: Secondary | ICD-10-CM | POA: Insufficient documentation

## 2016-11-12 DIAGNOSIS — M533 Sacrococcygeal disorders, not elsewhere classified: Secondary | ICD-10-CM | POA: Diagnosis not present

## 2016-11-12 MED ORDER — TRAMADOL HCL 50 MG PO TABS: 50.0000 mg | ORAL_TABLET | Freq: Four times a day (QID) | ORAL | 5 refills | Status: DC | PRN

## 2016-11-12 NOTE — Progress Notes (Signed)
  PROCEDURE RECORD [facility]   Name: Maria Powers DOB:03/22/1976 MRN: [id number]  Date:11/12/2016  Physician: Claudette LawsAndrew Kirsteins, MD    Nurse/CMA: Wessling CMA Allergies:  Allergies  Allergen Reactions  . Clindamycin/Lincomycin Nausea And Vomiting  . Nubain [Nalbuphine Hcl] Other (See Comments)    *feels like something crawling on her*    Consent Signed: Yes.    Is patient diabetic? No.  CBG today?  Pregnant: No. LMP: No LMP recorded. Patient is not currently having periods (Reason: IUD). (age 40-55)  Anticoagulants: no Anti-inflammatory: yes (advil and aleve yesterday) Antibiotics: no  Procedure: right sacroiliac injection     Position: Prone Start Time: 11:40 am              End Time:  11:44 am          Fluoro Time:13  RN/CMA Bright, CMA Wessling, CMA    Time 11:2 1am 11:50am    BP 132/86 137/91    Pulse 93 90    Respirations 16 16    O2 Sat 98 96    S/S 6 6    Pain Level 6/10 5/10     D/C home with roommate, patient A & O X 3, D/C instructions reviewed, and sits independently.

## 2016-11-12 NOTE — Patient Instructions (Addendum)

## 2016-11-12 NOTE — Progress Notes (Signed)
Right sacroiliac injection under fluoroscopic guidance  Indication: Right Low back and buttocks pain not relieved by medication management and other conservative care.  Informed consent was obtained after describing risks and benefits of the procedure with the patient, this includes bleeding, bruising, infection, paralysis and medication side effects. The patient wishes to proceed and has given written consent. The patient was placed in a prone position. The lumbar and sacral area was marked and prepped with Betadine. A 25-gauge 1-1/2 inch needle was inserted into the skin and subcutaneous tissue and 1 mL of 1% lidocaine was injected. Then a 25-gauge 3.5 inch spinal needle was inserted under fluoroscopic guidance into the Right sacroiliac joint. AP and lateral images were utilized. Omnipaque 180x0.5 mL under live fluoroscopy demonstrated no intravascular uptake. Then a solution containing one ML of 6 mgper ml betamethasone and 2 ML of 1% lidocaine MPF was injected x1.5 mL. Patient tolerated the procedure well. Post procedure instructions were given. Please see post procedure form. 

## 2016-11-24 ENCOUNTER — Ambulatory Visit (INDEPENDENT_AMBULATORY_CARE_PROVIDER_SITE_OTHER): Payer: BLUE CROSS/BLUE SHIELD | Admitting: Family Medicine

## 2016-11-24 ENCOUNTER — Telehealth: Payer: Self-pay | Admitting: Family Medicine

## 2016-11-24 ENCOUNTER — Encounter: Payer: Self-pay | Admitting: Family Medicine

## 2016-11-24 VITALS — BP 124/82 | HR 95 | Temp 98.6°F | Ht 64.75 in

## 2016-11-24 DIAGNOSIS — Z8719 Personal history of other diseases of the digestive system: Secondary | ICD-10-CM | POA: Diagnosis not present

## 2016-11-24 DIAGNOSIS — K5900 Constipation, unspecified: Secondary | ICD-10-CM

## 2016-11-24 DIAGNOSIS — K625 Hemorrhage of anus and rectum: Secondary | ICD-10-CM

## 2016-11-24 MED ORDER — HYDROCORTISONE 2.5 % RE CREA: 1.0000 "application " | TOPICAL_CREAM | Freq: Two times a day (BID) | RECTAL | 0 refills | Status: DC

## 2016-11-24 NOTE — Patient Instructions (Signed)
BEFORE YOU LEAVE: -follow up: 2 weeks  Metameucil daily in water before breakfast. For the next 5 days mirilax in water twice daily then once daily as needed to keep stools soft with daily bowel movement without straining.  Sitz baths  Use the hemorrhoid cream as instructed.  Seek care immediately if you having a lot of bleeding or new concerns.  About Hemorrhoids  Hemorrhoids are swollen veins in the lower rectum and anus.  Also called piles, hemorrhoids are a common problem.  Hemorrhoids may be internal (inside the rectum) or external (around the anus).  Internal Hemorrhoids  Internal hemorrhoids are often painless, but they rarely cause bleeding.  The internal veins may stretch and fall down (prolapse) through the anus to the outside of the body.  The veins may then become irritated and painful.  External Hemorrhoids  External hemorrhoids can be easily seen or felt around the anal opening.  They are under the skin around the anus.  When the swollen veins are scratched or broken by straining, rubbing or wiping they sometimes bleed.  How Hemorrhoids Occur  Veins in the rectum and around the anus tend to swell under pressure.  Hemorrhoids can result from increased pressure in the veins of your anus or rectum.  Some sources of pressure are:   Straining to have a bowel movement because of constipation  Waiting too long to have a bowel movement  Coughing and sneezing often  Sitting for extended periods of time, including on the toilet  Diarrhea  Obesity  Trauma or injury to the anus  Some liver diseases  Stress  Family history of hemorrhoids  Pregnancy  Pregnant women should try to avoid becoming constipated, because they are more likely to have hemorrhoids during pregnancy.  In the last trimester of pregnancy, the enlarged uterus may press on blood vessels and causes hemorrhoids.  In addition, the strain of childbirth sometimes causes hemorrhoids after the  birth.  Symptoms of Hemorrhoids  Some symptoms of hemorrhoids include:  Swelling and/or a tender lump around the anus  Itching, mild burning and bleeding around the anus  Painful bowel movements with or without constipation  Bright red blood covering the stool, on toilet paper or in the toilet bowel.   Symptoms usually go away within a few days.  Always talk to your doctor about any bleeding to make sure it is not from some other causes.  Diagnosing and Treating Hemorrhoids  Diagnosis is made by an examination by your healthcare provider.  Special test can be performed by your doctor.    Most cases of hemorrhoids can be treated with:  High-fiber diet: Eat more high-fiber foods, which help prevent constipation.  Ask for more detailed fiber information on types and sources of fiber from your healthcare provider.  Fluids: Drink plenty of water.  This helps soften bowel movements so they are easier to pass.  Sitz baths and cold packs: Sitting in lukewarm water two or three times a day for 15 minutes cleases the anal area and may relieve discomfort.  If the water is too hot, swelling around the anus will get worse.  Placing a cloth-covered ice pack on the anus for ten minutes four times a day can also help reduce selling.  Gently pushing a prolapsed hemorrhoid back inside after the bath or ice pack can be helpful.  Medications: For mild discomfort, your healthcare provider may suggest over-the-counter pain medication or prescribe a cream or ointment for topical use.  The cream may  contain witch hazel, zinc oxide or petroleum jelly.  Medicated suppositories are also a treatment option.  Always consult your doctor before applying medications or creams.  Procedures and surgeries: There are also a number of procedures and surgeries to shrink or remove hemorrhoids in more serious cases.  Talk to your physician about these options.  You can often prevent hemorrhoids or keep them from becoming  worse by maintaining a healthy lifestyle.  Eat a fiber-rich diet of fruits, vegetables and whole grains.  Also, drink plenty of water and exercise regularly.   2007, [employer] Doc.30

## 2016-11-24 NOTE — Telephone Encounter (Signed)
Patient Name: Maria Powers DOB: 03-Feb-1976 Initial Comment Caller says, rectal bleeding, which has increased since last night. Nurse Assessment Nurse: Yetta Barre, RN, Miranda Date/Time (Eastern Time): 11/24/2016 10:28:57 AM Confirm and document reason for call. If symptomatic, describe symptoms. ---Caller states she started having rectal bleeding yesterday evening. She thought it was related to a hemorrhoid. Not associated with pass BM. Does the patient have any new or worsening symptoms? ---Yes Will a triage be completed? ---Yes Related visit to physician within the last 2 weeks? ---No Does the PT have any chronic conditions? (i.e. diabetes, asthma, etc.) ---Yes List chronic conditions. ---Hemorrhoid, HTN, Back pain Is the patient pregnant or possibly pregnant? (Ask all females between the ages of 37-55) ---No Is this a behavioral health or substance abuse call? ---No Guidelines Guideline Title Affirmed Question Affirmed Notes Rectal Bleeding MODERATE rectal bleeding (small blood clots, passing blood without stool, or toilet water turns red) Final Disposition User See Physician within 24 Hours Yetta Barre, RN, Miranda Comments Appt scheduled for 1pm today with Dr. Selena Batten Referrals REFERRED TO PCP OFFICE Disagree/Comply: Comply

## 2016-11-24 NOTE — Progress Notes (Signed)
HPI:  Acute visit for bright red blood per rectum: -Started yesterday -History of the same, reports long history of hemorrhoids with intermittent bleeding -She also reports a long history of constipation, has not had a bowel movement in several days -She reports she has seen a streak of blood on the toilet paper several times daily and several times a day that she is sure is per rectum -Mild discomfort, but severe pain -Denies blood in the toilet bowl, large amounts of bleeding, weakness, palpitations, diarrhea, change in bowels, vaginal bleeding, hematuria or dysuria  ROS: See pertinent positives and negatives per HPI.  Past Medical History:  Diagnosis Date  . Anxiety   . Bipolar 1 disorder (HCC)   . Chronic pain    back, hip - seeing PMR  . Depression   . Dysmenorrhea   . Hypertension   . Obesity     Past Surgical History:  Procedure Laterality Date  . KIDNEY STONE SURGERY  2007  . KNEE ARTHROSCOPY Right 2003  . KNEE ARTHROSCOPY Right 2007  . SHOULDER ARTHROSCOPY Right 2007  . WISDOM TOOTH EXTRACTION  Highschool    Family History  Problem Relation Age of Onset  . Addison's disease Mother   . Diabetes Mother   . Bipolar disorder Mother   . Fibroids Maternal Aunt     Social History   Social History  . Marital status: Single    Spouse name: N/A  . Number of children: N/A  . Years of education: N/A   Social History Main Topics  . Smoking status: Former Games developer  . Smokeless tobacco: Never Used  . Alcohol use No  . Drug use: No  . Sexual activity: Not Currently    Partners: Male    Birth control/ protection: Abstinence, IUD     Comment: Mirena IUD inserted 06-21-15   Other Topics Concern  . None   Social History Narrative   Work or School: Pharmacist, hospital      Home Situation: roommate      Spiritual Beliefs:mormon      Lifestyle: no regular exercise; horrible diet              Current Outpatient Prescriptions:  .  amLODipine  (NORVASC) 5 MG tablet, Take 1 tablet (5 mg total) by mouth daily., Disp: 90 tablet, Rfl: 1 .  busPIRone (BUSPAR) 10 MG tablet, Take 20 mg by mouth 2 (two) times daily. , Disp: , Rfl:  .  cyclobenzaprine (FLEXERIL) 10 MG tablet, Take 1 tablet (10 mg total) by mouth 3 (three) times daily., Disp: 90 tablet, Rfl: 7 .  doxepin (SINEQUAN) 10 MG capsule, , Disp: , Rfl: 0 .  FLUoxetine (PROZAC) 40 MG capsule, Take 40 mg by mouth daily., Disp: , Rfl:  .  gabapentin (NEURONTIN) 100 MG capsule, Take 1 capsule (100 mg total) by mouth 3 (three) times daily., Disp: 90 capsule, Rfl: 3 .  lamoTRIgine (LAMICTAL) 150 MG tablet, Take 150 mg by mouth. 2 tabs at bedtime, Disp: , Rfl:  .  levonorgestrel (MIRENA) 20 MCG/24HR IUD, 1 each by Intrauterine route once., Disp: , Rfl:  .  lithium carbonate 300 MG capsule, Take 300 mg by mouth 2 (two) times daily with a meal. , Disp: , Rfl: 1 .  rizatriptan (MAXALT-MLT) 5 MG disintegrating tablet, Take 1 tablet (5 mg total) by mouth as needed., Disp: 10 tablet, Rfl: 3 .  traMADol (ULTRAM) 50 MG tablet, Take 1 tablet (50 mg total) by mouth every 6 (  six) hours as needed., Disp: 90 tablet, Rfl: 5 .  hydrocortisone (PROCTOSOL HC) 2.5 % rectal cream, Place 1 application rectally 2 (two) times daily., Disp: 30 g, Rfl: 0  EXAM:  Vitals:   11/24/16 1301  BP: 124/82  Pulse: 95  Temp: 98.6 F (37 C)    There is no height or weight on file to calculate BMI.  GENERAL: vitals reviewed and listed above, alert, oriented, appears well hydrated and in no acute distress  HEENT: atraumatic, conjunttiva clear, no obvious abnormalities on inspection of external nose and ears  NECK: no obvious masses on inspection  LUNGS: clear to auscultation bilaterally, no wheezes, rales or rhonchi, good air movement  CV: HRRR, no peripheral edema  RECTA:  external hemorrhoid - no active bleeding currently but has area of dried blood  MS: moves all extremities without noticeable  abnormality  PSYCH: pleasant and cooperative, no obvious depression or anxiety  ASSESSMENT AND PLAN:  Discussed the following assessment and plan:  BRBPR (bright red blood per rectum)  Hx of hemorrhoids  Constipation, unspecified constipation type  -Source of her bleeding is likely the hemorrhoid -Advised starting and continuing a bowel regimen to keep stools soft,  -Patsits baths, Proctosol, return and emergency precautions  -Advised GI evaluation given it seems she has struggled with this for a long time, she declined -Follow up for recheck in 2 weeks Pient advised to return or notify a doctor immediately if symptoms worsen or persist or new concerns arise.  Patient Instructions  BEFORE YOU LEAVE: -follow up: 2 weeks  Metameucil daily in water before breakfast. For the next 5 days mirilax in water twice daily then once daily as needed to keep stools soft with daily bowel movement without straining.  Sitz baths  Use the hemorrhoid cream as instructed.  Seek care immediately if you having a lot of bleeding or new concerns.  About Hemorrhoids  Hemorrhoids are swollen veins in the lower rectum and anus.  Also called piles, hemorrhoids are a common problem.  Hemorrhoids may be internal (inside the rectum) or external (around the anus).  Internal Hemorrhoids  Internal hemorrhoids are often painless, but they rarely cause bleeding.  The internal veins may stretch and fall down (prolapse) through the anus to the outside of the body.  The veins may then become irritated and painful.  External Hemorrhoids  External hemorrhoids can be easily seen or felt around the anal opening.  They are under the skin around the anus.  When the swollen veins are scratched or broken by straining, rubbing or wiping they sometimes bleed.  How Hemorrhoids Occur  Veins in the rectum and around the anus tend to swell under pressure.  Hemorrhoids can result from increased pressure in the veins of  your anus or rectum.  Some sources of pressure are:   Straining to have a bowel movement because of constipation  Waiting too long to have a bowel movement  Coughing and sneezing often  Sitting for extended periods of time, including on the toilet  Diarrhea  Obesity  Trauma or injury to the anus  Some liver diseases  Stress  Family history of hemorrhoids  Pregnancy  Pregnant women should try to avoid becoming constipated, because they are more likely to have hemorrhoids during pregnancy.  In the last trimester of pregnancy, the enlarged uterus may press on blood vessels and causes hemorrhoids.  In addition, the strain of childbirth sometimes causes hemorrhoids after the birth.  Symptoms of Hemorrhoids  Some  symptoms of hemorrhoids include:  Swelling and/or a tender lump around the anus  Itching, mild burning and bleeding around the anus  Painful bowel movements with or without constipation  Bright red blood covering the stool, on toilet paper or in the toilet bowel.   Symptoms usually go away within a few days.  Always talk to your doctor about any bleeding to make sure it is not from some other causes.  Diagnosing and Treating Hemorrhoids  Diagnosis is made by an examination by your healthcare provider.  Special test can be performed by your doctor.    Most cases of hemorrhoids can be treated with:  High-fiber diet: Eat more high-fiber foods, which help prevent constipation.  Ask for more detailed fiber information on types and sources of fiber from your healthcare provider.  Fluids: Drink plenty of water.  This helps soften bowel movements so they are easier to pass.  Sitz baths and cold packs: Sitting in lukewarm water two or three times a day for 15 minutes cleases the anal area and may relieve discomfort.  If the water is too hot, swelling around the anus will get worse.  Placing a cloth-covered ice pack on the anus for ten minutes four times a day can also help  reduce selling.  Gently pushing a prolapsed hemorrhoid back inside after the bath or ice pack can be helpful.  Medications: For mild discomfort, your healthcare provider may suggest over-the-counter pain medication or prescribe a cream or ointment for topical use.  The cream may contain witch hazel, zinc oxide or petroleum jelly.  Medicated suppositories are also a treatment option.  Always consult your doctor before applying medications or creams.  Procedures and surgeries: There are also a number of procedures and surgeries to shrink or remove hemorrhoids in more serious cases.  Talk to your physician about these options.  You can often prevent hemorrhoids or keep them from becoming worse by maintaining a healthy lifestyle.  Eat a fiber-rich diet of fruits, vegetables and whole grains.  Also, drink plenty of water and exercise regularly.   2007, [employer] Doc.30      Kriste Basque R., DO

## 2016-11-24 NOTE — Telephone Encounter (Signed)
Noted  

## 2016-11-24 NOTE — Progress Notes (Signed)
Pre visit review using our clinic review tool, if applicable. No additional management support is needed unless otherwise documented below in the visit note. 

## 2016-12-06 ENCOUNTER — Other Ambulatory Visit: Payer: Self-pay | Admitting: Physical Medicine & Rehabilitation

## 2016-12-07 ENCOUNTER — Ambulatory Visit: Payer: BLUE CROSS/BLUE SHIELD | Admitting: Family Medicine

## 2017-01-12 ENCOUNTER — Ambulatory Visit: Payer: BLUE CROSS/BLUE SHIELD | Admitting: Family Medicine

## 2017-01-18 ENCOUNTER — Ambulatory Visit (HOSPITAL_BASED_OUTPATIENT_CLINIC_OR_DEPARTMENT_OTHER): Payer: BLUE CROSS/BLUE SHIELD | Admitting: Physical Medicine & Rehabilitation

## 2017-01-18 ENCOUNTER — Encounter: Payer: BLUE CROSS/BLUE SHIELD | Attending: Physical Medicine & Rehabilitation

## 2017-01-18 ENCOUNTER — Encounter: Payer: Self-pay | Admitting: Physical Medicine & Rehabilitation

## 2017-01-18 VITALS — BP 140/90 | HR 91 | Resp 14

## 2017-01-18 DIAGNOSIS — M533 Sacrococcygeal disorders, not elsewhere classified: Secondary | ICD-10-CM | POA: Diagnosis not present

## 2017-01-18 DIAGNOSIS — G8929 Other chronic pain: Secondary | ICD-10-CM

## 2017-01-18 DIAGNOSIS — R52 Pain, unspecified: Secondary | ICD-10-CM | POA: Diagnosis not present

## 2017-01-18 DIAGNOSIS — M791 Myalgia: Secondary | ICD-10-CM

## 2017-01-18 DIAGNOSIS — M545 Low back pain, unspecified: Secondary | ICD-10-CM

## 2017-01-18 DIAGNOSIS — M7918 Myalgia, other site: Secondary | ICD-10-CM

## 2017-01-18 MED ORDER — METHOCARBAMOL 500 MG PO TABS: 500.0000 mg | ORAL_TABLET | Freq: Three times a day (TID) | ORAL | 0 refills | Status: DC | PRN

## 2017-01-18 NOTE — Patient Instructions (Addendum)
If not better in a week would rec PT  Will switch from cyclobenzaprine to methocarbamol for 2 weeks  Call if not much better in 1-2 wks so we can start PT  May do trigger point injection in a month

## 2017-01-18 NOTE — Progress Notes (Signed)
Subjective:    Patient ID: Maria Powers, female    DOB: 09-20-1976, 40 y.o.   MRN: [id number]  HPI CC:  Low back and hip pain  Gripping type of pain, higher up than usual pain. No LE pain. No fall or trauma. Right Sacroiliac injection 11/12/16 Groin muscles tense up. Feels defeated.   No new bowel or bladder issue No fevers or chills Had night sweats a couple nights No change in menstrual cycle No recent change in psych meds Sleeps ok    Pain Inventory Average Pain 3 Pain Right Now 7 My pain is constant, sharp, burning, dull, stabbing, tingling and aching  In the last 24 hours, has pain interfered with the following? General activity 10 Relation with others 10 Enjoyment of life 10 What TIME of day is your pain at its worst? all Sleep (in general) Fair  Pain is worse with: walking, bending, standing and some activites Pain improves with: sitting still Relief from Meds: 2  Mobility walk without assistance  Function employed # of hrs/week 35-40  Neuro/Psych trouble walking spasms  Prior Studies Any changes since last visit?  no  Physicians involved in your care Any changes since last visit?  no   Family History  Problem Relation Age of Onset  . Addison's disease Mother   . Diabetes Mother   . Bipolar disorder Mother   . Fibroids Maternal Aunt    Social History   Social History  . Marital status: Single    Spouse name: N/A  . Number of children: N/A  . Years of education: N/A   Social History Main Topics  . Smoking status: Former Games developer  . Smokeless tobacco: Never Used  . Alcohol use No  . Drug use: No  . Sexual activity: Not Currently    Partners: Male    Birth control/ protection: Abstinence, IUD     Comment: Mirena IUD inserted 06-21-15   Other Topics Concern  . None   Social History Narrative   Work or School: Pharmacist, hospital      Home Situation: roommate      Spiritual Beliefs:mormon      Lifestyle: no regular  exercise; horrible diet            Past Surgical History:  Procedure Laterality Date  . KIDNEY STONE SURGERY  2007  . KNEE ARTHROSCOPY Right 2003  . KNEE ARTHROSCOPY Right 2007  . SHOULDER ARTHROSCOPY Right 2007  . WISDOM TOOTH EXTRACTION  Highschool   Past Medical History:  Diagnosis Date  . Anxiety   . Bipolar 1 disorder (HCC)   . Chronic pain    back, hip - seeing PMR  . Depression   . Dysmenorrhea   . Hypertension   . Obesity    BP 140/90 (BP Location: Left Arm, Patient Position: Sitting, Cuff Size: Large)   Pulse 91   Resp 14   SpO2 96%   Opioid Risk Score:   Fall Risk Score:  `1  Depression screen PHQ 2/9  Depression screen [facility] 2/9 10/24/2015 06/18/2015 01/03/2015 02/27/2013  Decreased Interest 0  Down, Depressed, Hopeless -  PHQ - 2 Score 0  Altered sleeping - - 3 -  Tired, decreased energy - - 2 -  Change in appetite - - 0 -  Feeling bad or failure about yourself  - - 1 -  Trouble concentrating - - 1 -  Moving slowly or fidgety/restless - -  0 -  Suicidal thoughts - - 0 -  PHQ-9 Score - - 9 -    Review of Systems  Constitutional: Negative.   HENT: Negative.   Eyes: Negative.   Respiratory: Negative.   Cardiovascular: Negative.   Gastrointestinal: Negative.   Endocrine: Negative.   Genitourinary: Negative.   Musculoskeletal: Positive for back pain and gait problem.       Spasms   Skin: Negative.   Allergic/Immunologic: Negative.   Hematological: Negative.   Psychiatric/Behavioral: Negative.   All other systems reviewed and are negative.      Objective:   Physical Exam  Constitutional: She is oriented to person, place, and time. She appears well-developed and well-nourished.  HENT:  Head: Normocephalic and atraumatic.  Eyes: Conjunctivae and EOM are normal. Pupils are equal, round, and reactive to light.  Neck: Normal range of motion.  Cardiovascular: Normal rate and normal heart sounds.   No murmur heard. Pulmonary/Chest:  Effort normal and breath sounds normal. No respiratory distress. She has no wheezes.  Abdominal: Soft. Bowel sounds are normal. She exhibits no distension. There is no tenderness.  Musculoskeletal: She exhibits no deformity.       Right hip: Normal.       Thoracic back: She exhibits decreased range of motion, tenderness and spasm.       Lumbar back: She exhibits tenderness and spasm. She exhibits no deformity.  Neurological: She is alert and oriented to person, place, and time.  Psychiatric: She has a normal mood and affect.  Nursing note and vitals reviewed.         Assessment & Plan:  1. Acute exacerbation of chronic low back pain. She has underlying sacroiliac dysfunction does quite well after injections. More recently, she's had pain that has radiated from the lumbar to the thoracic area along the paraspinal musculature.  Exam consistent with myofascial pain. She is already on cyclobenzaprine 10 3 times a day on a chronic basis, will stop this and switched to methocarbamol 500 3 times a day over the next 2 weeks. If she is not much better in 2 weeks. We'll start some physical therapy. Recheck in 1 month if not much better. Consider trigger point injections.  May repeat sacroiliac injection next month. depending on patient's schedule

## 2017-02-03 ENCOUNTER — Other Ambulatory Visit: Payer: Self-pay | Admitting: Family Medicine

## 2017-02-19 ENCOUNTER — Ambulatory Visit: Payer: BLUE CROSS/BLUE SHIELD | Admitting: Physical Medicine & Rehabilitation

## 2017-03-26 ENCOUNTER — Ambulatory Visit: Payer: Self-pay | Admitting: Family Medicine

## 2017-03-31 ENCOUNTER — Other Ambulatory Visit: Payer: Self-pay | Admitting: Physical Medicine & Rehabilitation

## 2017-04-01 ENCOUNTER — Ambulatory Visit (HOSPITAL_BASED_OUTPATIENT_CLINIC_OR_DEPARTMENT_OTHER): Payer: BLUE CROSS/BLUE SHIELD | Admitting: Physical Medicine & Rehabilitation

## 2017-04-01 ENCOUNTER — Encounter: Payer: BLUE CROSS/BLUE SHIELD | Attending: Physical Medicine & Rehabilitation

## 2017-04-01 ENCOUNTER — Encounter: Payer: Self-pay | Admitting: Physical Medicine & Rehabilitation

## 2017-04-01 VITALS — BP 154/96 | HR 94 | Resp 14

## 2017-04-01 DIAGNOSIS — M533 Sacrococcygeal disorders, not elsewhere classified: Secondary | ICD-10-CM

## 2017-04-01 DIAGNOSIS — M545 Low back pain: Secondary | ICD-10-CM | POA: Insufficient documentation

## 2017-04-01 DIAGNOSIS — R52 Pain, unspecified: Secondary | ICD-10-CM | POA: Insufficient documentation

## 2017-04-01 MED ORDER — GABAPENTIN 300 MG PO CAPS: 300.0000 mg | ORAL_CAPSULE | Freq: Two times a day (BID) | ORAL | 2 refills | Status: DC

## 2017-04-01 MED ORDER — TRAMADOL HCL 50 MG PO TABS: 50.0000 mg | ORAL_TABLET | Freq: Four times a day (QID) | ORAL | 5 refills | Status: DC | PRN

## 2017-04-01 NOTE — Progress Notes (Signed)
  PROCEDURE RECORD [facility]   Name: Maria Powers DOB:01/09/1976 MRN: [id number]  Date:04/01/2017  Physician: Claudette LawsAndrew Kirsteins, MD    Nurse/CMA: Jelani Trueba, CMA  Allergies:  Allergies  Allergen Reactions  . Clindamycin/Lincomycin Nausea And Vomiting  . Nubain [Nalbuphine Hcl] Other (See Comments)    *feels like something crawling on her*    Consent Signed: Yes.    Is patient diabetic? No.  CBG today? N/A  Pregnant: No. LMP: No LMP recorded. Patient is not currently having periods (Reason: IUD). (age 40-55)  Anticoagulants: no Anti-inflammatory: no Antibiotics: no  Procedure: right sacrpoiliac steroid injection    Position: Prone Start Time: 9:52am  End Time: 9:57am  Fluoro Time: 17  RN/CMA Courteny Egler, CMA Gabriella Guile, CMA    Time 9:35am 10:01am    BP 154/96 147/87    Pulse 94 90    Respirations 14 14    O2 Sat 96 96    S/S 6 6    Pain Level 3/10 3/10     D/C home with Aunt Janit Bern([place]ia Boyd), patient A & O X 3, D/C instructions reviewed, and sits independently.

## 2017-04-01 NOTE — Patient Instructions (Signed)
Sacroiliac injection was performed today. A combination of a naming medicine plus a cortisone medicine was injected. The injection was done under x-ray guidance. This procedure has been performed to help reduce low back and buttocks pain as well as potentially hip pain. The duration of this injection is variable lasting from hours to  Months. It may repeated if needed. 

## 2017-04-01 NOTE — Progress Notes (Signed)
Right sacroiliac injection under fluoroscopic guidance  Indication: Right Low back and buttocks pain not relieved by medication management and other conservative care.  Informed consent was obtained after describing risks and benefits of the procedure with the patient, this includes bleeding, bruising, infection, paralysis and medication side effects. The patient wishes to proceed and has given written consent. The patient was placed in a prone position. The lumbar and sacral area was marked and prepped with Betadine. A 25-gauge 1-1/2 inch needle was inserted into the skin and subcutaneous tissue and 1 mL of 1% lidocaine was injected. Then a 25-gauge 3.5 inch spinal needle was inserted under fluoroscopic guidance into the Right sacroiliac joint. AP and lateral images were utilized. Omnipaque 180x0.5 mL under live fluoroscopy demonstrated no intravascular uptake. Then a solution containing one ML of 6 mgper ml betamethasone and 2 ML of 1% lidocaine MPF was injected x1.5 mL. Patient tolerated the procedure well. Post procedure instructions were given. Please see post procedure form. 

## 2017-05-17 ENCOUNTER — Other Ambulatory Visit: Payer: Self-pay | Admitting: Physical Medicine & Rehabilitation

## 2017-06-10 ENCOUNTER — Encounter: Payer: Self-pay | Admitting: Family Medicine

## 2017-06-20 ENCOUNTER — Other Ambulatory Visit: Payer: Self-pay | Admitting: Physical Medicine & Rehabilitation

## 2017-07-01 ENCOUNTER — Ambulatory Visit (HOSPITAL_BASED_OUTPATIENT_CLINIC_OR_DEPARTMENT_OTHER): Payer: BLUE CROSS/BLUE SHIELD | Admitting: Physical Medicine & Rehabilitation

## 2017-07-01 ENCOUNTER — Encounter: Payer: Self-pay | Admitting: Physical Medicine & Rehabilitation

## 2017-07-01 ENCOUNTER — Encounter: Payer: BLUE CROSS/BLUE SHIELD | Attending: Physical Medicine & Rehabilitation

## 2017-07-01 VITALS — BP 143/93 | HR 90 | Resp 14

## 2017-07-01 DIAGNOSIS — G8929 Other chronic pain: Secondary | ICD-10-CM

## 2017-07-01 DIAGNOSIS — M545 Low back pain: Secondary | ICD-10-CM | POA: Diagnosis present

## 2017-07-01 DIAGNOSIS — R52 Pain, unspecified: Secondary | ICD-10-CM | POA: Insufficient documentation

## 2017-07-01 DIAGNOSIS — M533 Sacrococcygeal disorders, not elsewhere classified: Secondary | ICD-10-CM

## 2017-07-01 MED ORDER — CYCLOBENZAPRINE HCL 10 MG PO TABS: 10.0000 mg | ORAL_TABLET | Freq: Three times a day (TID) | ORAL | 1 refills | Status: DC

## 2017-07-01 MED ORDER — GABAPENTIN 300 MG PO CAPS: 300.0000 mg | ORAL_CAPSULE | Freq: Two times a day (BID) | ORAL | 1 refills | Status: DC

## 2017-07-01 NOTE — Patient Instructions (Signed)
Sacroiliac injection was performed today. A combination of a naming medicine plus a cortisone medicine was injected. The injection was done under x-ray guidance. This procedure has been performed to help reduce low back and buttocks pain as well as potentially hip pain. The duration of this injection is variable lasting from hours to  Months. It may repeated if needed. 

## 2017-07-01 NOTE — Progress Notes (Signed)
Right sacroiliac injection under fluoroscopic guidance  Indication: Right Low back and buttocks pain not relieved by medication management and other conservative care.  Informed consent was obtained after describing risks and benefits of the procedure with the patient, this includes bleeding, bruising, infection, paralysis and medication side effects. The patient wishes to proceed and has given written consent. The patient was placed in a prone position. The lumbar and sacral area was marked and prepped with Betadine. A 25-gauge 1-1/2 inch needle was inserted into the skin and subcutaneous tissue and 1 mL of 1% lidocaine was injected. Then a 25-gauge 3.5 inch spinal needle was inserted under fluoroscopic guidance into the Right sacroiliac joint. AP and lateral images were utilized. Omnipaque 180x0.5 mL under live fluoroscopy demonstrated no intravascular uptake. Then a solution containing one ML of 6 mgper ml betamethasone and 2 ML of 1% lidocaine MPF was injected x1.5 mL. Patient tolerated the procedure well. Post procedure instructions were given. Please see post procedure form. 

## 2017-07-01 NOTE — Progress Notes (Signed)
  PROCEDURE RECORD [facility]   Name: Brittony Billick DOB:[birth year] MRN: [id number]  Date:07/01/2017  Physician: Claudette Laws, MD    Nurse/CMA: Kelechi Astarita, CMA  Allergies:  Allergies  Allergen Reactions  . Clindamycin/Lincomycin Nausea And Vomiting  . Nubain [Nalbuphine Hcl] Other (See Comments)    *feels like something crawling on her*    Consent Signed: Yes.    Is patient diabetic? No.  CBG today?   Pregnant: No. LMP: No LMP recorded. Patient is not currently having periods (Reason: IUD). (age 7-55)  Anticoagulants: no Anti-inflammatory: no Antibiotics: no  Procedure: right sacroiliac steroid injection Position: Prone Start Time:  9:24 am      End Time: 9:27am  Fluoro Time: 12  RN/CMA Alvita Fana, CMA Pellegrino Kennard, CMA    Time 9:10AM 9:30AM    BP 143/93 144/95    Pulse 90 87    Respirations 14 14    O2 Sat 96 95    S/S 6 6    Pain Level 3/10 3/10     D/C home with roommate, patient A & O X 3, D/C instructions reviewed, and sits independently.

## 2017-08-17 ENCOUNTER — Other Ambulatory Visit: Payer: Self-pay | Admitting: Adult Health

## 2017-08-30 ENCOUNTER — Ambulatory Visit: Payer: BLUE CROSS/BLUE SHIELD | Admitting: Family Medicine

## 2017-09-15 ENCOUNTER — Other Ambulatory Visit: Payer: Self-pay | Admitting: Family Medicine

## 2017-09-30 ENCOUNTER — Encounter: Payer: Self-pay | Admitting: Family Medicine

## 2017-10-06 ENCOUNTER — Other Ambulatory Visit: Payer: Self-pay | Admitting: Physical Medicine & Rehabilitation

## 2017-10-08 ENCOUNTER — Encounter: Payer: Self-pay | Admitting: Family Medicine

## 2017-10-08 ENCOUNTER — Ambulatory Visit: Payer: BLUE CROSS/BLUE SHIELD | Admitting: Family Medicine

## 2017-10-08 VITALS — BP 132/80 | HR 80 | Temp 98.3°F | Wt 251.0 lb

## 2017-10-08 DIAGNOSIS — M546 Pain in thoracic spine: Secondary | ICD-10-CM | POA: Diagnosis not present

## 2017-10-08 DIAGNOSIS — E785 Hyperlipidemia, unspecified: Secondary | ICD-10-CM | POA: Diagnosis not present

## 2017-10-08 DIAGNOSIS — I10 Essential (primary) hypertension: Secondary | ICD-10-CM | POA: Diagnosis not present

## 2017-10-08 DIAGNOSIS — R739 Hyperglycemia, unspecified: Secondary | ICD-10-CM

## 2017-10-08 LAB — BASIC METABOLIC PANEL
BUN: 12 mg/dL (ref 6–23)
CHLORIDE: 106 meq/L (ref 96–112)
CO2: 27 mEq/L (ref 19–32)
CREATININE: 0.71 mg/dL (ref 0.40–1.20)
Calcium: 9.3 mg/dL (ref 8.4–10.5)
GFR: 96.56 mL/min (ref >60.00)
GLUCOSE: 111 mg/dL — AB (ref 70–99)
POTASSIUM: 4.2 meq/L (ref 3.5–5.1)
Sodium: 140 mEq/L (ref 135–145)

## 2017-10-08 LAB — CBC
HEMATOCRIT: 39.4 % (ref 36.0–46.0)
HEMOGLOBIN: 12.7 g/dL (ref 12.0–15.0)
MCHC: 32.3 g/dL (ref 30.0–36.0)
MCV: 85.3 fl (ref 78.0–100.0)
Platelets: 270 10*3/uL (ref 150.0–400.0)
RBC: 4.62 Mil/uL (ref 3.87–5.11)
RDW: 13.7 % (ref 11.5–15.5)
WBC: 5.6 10*3/uL (ref 4.0–10.5)

## 2017-10-08 LAB — LIPID PANEL
CHOLESTEROL: 172 mg/dL (ref 0–200)
HDL: 46.6 mg/dL (ref >39.00)
LDL Cholesterol: 111 mg/dL — ABNORMAL HIGH (ref 0–99)
NonHDL: 125.12
Total CHOL/HDL Ratio: 4
Triglycerides: 73 mg/dL (ref 0.0–149.0)
VLDL: 14.6 mg/dL (ref 0.0–40.0)

## 2017-10-08 LAB — HEMOGLOBIN A1C: HEMOGLOBIN A1C: 5.7 % (ref 4.6–6.5)

## 2017-10-08 NOTE — Progress Notes (Signed)
HPI:   Acute/follow up:  New issue L mid back pain: -x1 month -sees Dr. Wynn Banker for low back pain chronic -this is a sharp, constant pain worse with certain movements or straining, better with rest -has muscle relaxers and pain medications for her back pain but they don't seem to help much -sees specialist next week, but wanted to check out earlier -no fevers, malaise, urinary symptoms, hematuria, radiation, weakness or numness  Fasted for labs as she is past due for chronic disease follow up - htn, hld, hyperglycemia. No regular exercise. Diet not great. No cp, sob, doe.   ROS: See pertinent positives and negatives per HPI.  Past Medical History:  Diagnosis Date  . Anxiety   . Bipolar 1 disorder (HCC)   . Chronic pain    back, hip - seeing PMR  . Depression   . Dysmenorrhea   . Hypertension   . Obesity     Past Surgical History:  Procedure Laterality Date  . KIDNEY STONE SURGERY  2007  . KNEE ARTHROSCOPY Right 2003  . KNEE ARTHROSCOPY Right 2007  . SHOULDER ARTHROSCOPY Right 2007  . WISDOM TOOTH EXTRACTION  Highschool    Family History  Problem Relation Age of Onset  . Addison's disease Mother   . Diabetes Mother   . Bipolar disorder Mother   . Fibroids Maternal Aunt     Social History   Socioeconomic History  . Marital status: Single    Spouse name: None  . Number of children: None  . Years of education: None  . Highest education level: None  Social Needs  . Financial resource strain: None  . Food insecurity - worry: None  . Food insecurity - inability: None  . Transportation needs - medical: None  . Transportation needs - non-medical: None  Occupational History  . None  Tobacco Use  . Smoking status: Former Games developer  . Smokeless tobacco: Never Used  Substance and Sexual Activity  . Alcohol use: No    Alcohol/week: 0.0 oz  . Drug use: No  . Sexual activity: Not Currently    Partners: Male    Birth control/protection: Abstinence, IUD   Comment: Mirena IUD inserted 06-21-15  Other Topics Concern  . 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  amLODipine (NORVASC) 5 MG tablet, TAKE 1 TABLET BY MOUTH EVERY DAY, Disp: 30 tablet, Rfl: 0 .  busPIRone (BUSPAR) 10 MG tablet, Take 20 mg by mouth 2 (two) times daily. , Disp: , Rfl:  .  cyclobenzaprine (FLEXERIL) 10 MG tablet, Take 1 tablet (10 mg total) by mouth 3 (three) times daily., Disp: 270 tablet, Rfl: 1 .  doxepin (SINEQUAN) 10 MG capsule, , Disp: , Rfl: 0 .  FLUoxetine (PROZAC) 40 MG capsule, Take 40 mg by mouth daily., Disp: , Rfl:  .  gabapentin (NEURONTIN) 300 MG capsule, Take 1 capsule (300 mg total) by mouth 2 (two) times daily., Disp: 180 capsule, Rfl: 1 .  lamoTRIgine (LAMICTAL) 150 MG tablet, Take 150 mg by mouth. 2 tabs at bedtime, Disp: , Rfl:  .  levonorgestrel (MIRENA) 20 MCG/24HR IUD, 1 each by Intrauterine route once., Disp: , Rfl:  .  lithium carbonate 300 MG capsule,  Take 300 mg by mouth 2 (two) times daily with a meal. , Disp: , Rfl: 1 .  rizatriptan (MAXALT-MLT) 5 MG disintegrating tablet, Take 1 tablet (5 mg total) by mouth as needed., Disp: 10 tablet, Rfl: 3 .  traMADol (ULTRAM) 50 MG tablet, Take 1 tablet (50 mg total) by mouth every 6 (six) hours as needed., Disp: 90 tablet, Rfl: 5 .  methocarbamol (ROBAXIN) 500 MG tablet, Take 1 tablet (500 mg total) by mouth every 8 (eight) hours as needed for muscle spasms. (Patient not taking: Reported on 10/08/2017), Disp: 45 tablet, Rfl: 0  EXAM:  Vitals:   10/08/17 0827  BP: 132/80  Pulse: 80  Temp: 98.3 F (36.8 C)  SpO2: 98%    Body mass index is 42.09 kg/m.  GENERAL: vitals reviewed and listed above, alert, oriented, appears well hydrated and in no acute distress  HEENT: atraumatic, conjunttiva  clear, no obvious abnormalities on inspection of external nose and ears  NECK: no obvious masses on inspection  LUNGS: clear to auscultation bilaterally, no wheezes, rales or rhonchi, good air movement  CV: HRRR, no peripheral edema  MS: moves all extremities without noticeable abnormality, no rash or skin changes in the area of concern, muscular tenderness to palpation in the paraspinal muscles of the thoracic back area, T8 through 12; T9 through L to rotated left side bent right.  No bony tenderness to palpation in this area.  PSYCH: pleasant and cooperative, no obvious depression or anxiety  ASSESSMENT AND PLAN:  Discussed the following assessment and plan:  Acute left-sided thoracic back pain  Hypertension, unspecified type - Plan: Basic metabolic panel, CBC  Dyslipidemia - Plan: Lipid panel  Hyperglycemia - Plan: Hemoglobin A1c  -we discussed possible serious and likely etiologies, workup and treatment, treatment risks and return precautions for the back pain.  Suspect muscular or musculoskeletal.  She opted for some conservative treatment and osteopathic treatment today.  She plans to follow-up with Dr. Wynn Banker next week about this issue, particularly if not improving. -We will do her regular labs for her follow-up of chronic disease today as well -Follow-up 1 month for her physical -of course, we advised Tinslee  to return or notify a doctor immediately if symptoms worsen or persist or new concerns arise.  PROCEDURE NOTE : OSTEOPATHIC TREATMENT The decision today to treat with gentle Osteopathic Manipulative Therapy  (OMT) was based on physical exam findings, diagnoses and patient wishes. Verbal consent was obtained after after explanation of risks and benefits. No Cervical HVLA manipulation was performed. After consent was obtained, treatment was  performed as below:      Regions treated:  Thoracic, lumbar     Techniques used: ME, counterstrain The patient  tolerated the treatment well and reported Improved  symptoms following treatment today. Follow up treatment was advised: with back specialist/PMR or [place] if issue persists  Patient Instructions  BEFORE YOU LEAVE: -upper back exercises -labs -follow up: CPE in 1 month  Do the exercises 4 days per week. Heat and topical sports creams as needed for pain. Follow up with your specialist next week as planned.  We have ordered labs or studies at this visit. It can take up to 1-2 weeks for results and processing. IF results require follow up or explanation, we will call you with instructions. Clinically stable results will be released to your [facility]. If you have not heard from [place] or cannot find your results in [facility] in 2 weeks please contact our office at [phone].  If you are not yet signed up for [facility], please consider signing up.          Kriste BasqueKIM, HANNAH R., DO

## 2017-10-08 NOTE — Telephone Encounter (Signed)
Recieved electronic medication refill request for tramadol, no mention in previous notes as to use or continue to use this medication.  Please advise.

## 2017-10-08 NOTE — Patient Instructions (Signed)
BEFORE YOU LEAVE: -upper back exercises -labs -follow up: CPE in 1 month  Do the exercises 4 days per week. Heat and topical sports creams as needed for pain. Follow up with your specialist next week as planned.  We have ordered labs or studies at this visit. It can take up to 1-2 weeks for results and processing. IF results require follow up or explanation, we will call you with instructions. Clinically stable results will be released to your [facility]. If you have not heard from [place] or cannot find your results in [facility] in 2 weeks please contact our office at [phone].  If you are not yet signed up for [facility], please consider signing up.

## 2017-10-11 ENCOUNTER — Telehealth: Payer: Self-pay | Admitting: Family Medicine

## 2017-10-11 NOTE — Telephone Encounter (Signed)
Copied from CRM [id number]. Topic: Quick Communication - See Telephone Encounter >> Oct 11, 2017  4:38 PM Terisa Starraylor, Brittany L wrote: CRM for notification. See Telephone encounter for:   10/11/17.  Patient is requesting her lab results. PEC line was busy & she did not want to wait. Call back is [phone]

## 2017-10-11 NOTE — Telephone Encounter (Signed)
Pt given results and documented in result note 

## 2017-10-14 ENCOUNTER — Encounter: Payer: Self-pay | Admitting: Physical Medicine & Rehabilitation

## 2017-10-14 ENCOUNTER — Encounter: Payer: BLUE CROSS/BLUE SHIELD | Attending: Physical Medicine & Rehabilitation

## 2017-10-14 ENCOUNTER — Ambulatory Visit: Payer: BLUE CROSS/BLUE SHIELD | Admitting: Physical Medicine & Rehabilitation

## 2017-10-14 VITALS — BP 142/95 | HR 86

## 2017-10-14 DIAGNOSIS — M533 Sacrococcygeal disorders, not elsewhere classified: Secondary | ICD-10-CM | POA: Diagnosis not present

## 2017-10-14 DIAGNOSIS — M545 Low back pain: Secondary | ICD-10-CM | POA: Diagnosis not present

## 2017-10-14 DIAGNOSIS — Z79899 Other long term (current) drug therapy: Secondary | ICD-10-CM

## 2017-10-14 DIAGNOSIS — Z5181 Encounter for therapeutic drug level monitoring: Secondary | ICD-10-CM

## 2017-10-14 DIAGNOSIS — M7918 Myalgia, other site: Secondary | ICD-10-CM | POA: Insufficient documentation

## 2017-10-14 NOTE — Progress Notes (Signed)
  PROCEDURE RECORD [facility]   Name: Maria Powers DOB:02/07/1976 MRN: [id number]  Date:10/14/2017  Physician: Claudette LawsAndrew Kirsteins, MD    Nurse/CMA: Kenzlee Fishburn CMA  Allergies:  Allergies  Allergen Reactions  . Clindamycin/Lincomycin Nausea And Vomiting  . Nubain [Nalbuphine Hcl] Other (See Comments)    *feels like something crawling on her*    Consent Signed: Yes.    Is patient diabetic? No.  CBG today? NA  Pregnant: No. LMP: No LMP recorded. Patient is not currently having periods (Reason: IUD). (age 41-55)  Anticoagulants: no Anti-inflammatory: no Antibiotics: no  Procedure: Right sacroiliac injection (3.5 cm blue hub) Position: Prone  Start Time: 1.56pm     End Time:  2:03pm Fluoro Time: 24s  RN/CMA Daelyn Mozer CMA Wessling CMA    Time 129pm 2:07pm    BP 142/95 151/91    Pulse 86 84    Respirations 16 16    O2 Sat 99 97    S/S 6 6    Pain Level 4/10 2/10     D/C home with roommate nancy, patient A & O X 3, D/C instructions reviewed, and sits independently.

## 2017-10-14 NOTE — Progress Notes (Signed)

## 2017-10-14 NOTE — Patient Instructions (Signed)
Sacroiliac injection was performed today. A combination of a naming medicine plus a cortisone medicine was injected. The injection was done under x-ray guidance. This procedure has been performed to help reduce low back and buttocks pain as well as potentially hip pain. The duration of this injection is variable lasting from hours to  Months. It may repeated if needed. 

## 2017-10-19 LAB — TOXASSURE SELECT,+ANTIDEPR,UR

## 2017-10-20 ENCOUNTER — Telehealth: Payer: Self-pay | Admitting: *Deleted

## 2017-10-20 NOTE — Telephone Encounter (Signed)
Urine drug screen for this encounter is consistent for prescribed medication 

## 2017-10-24 ENCOUNTER — Other Ambulatory Visit: Payer: Self-pay | Admitting: Family Medicine

## 2017-11-13 NOTE — Progress Notes (Signed)
HPI:  Here for CPE:  -Concerns and/or follow up today:   PMH hyperglycemia, hyperlipidemia, obesity, hypertension, bipolar disorder (sees psychiatry for management), chronic back pain (sees PMR for management) and migraines here for her physical.  She had her labs a few weeks ago.  Due for pap per HM section - sees gyn.  Reports she will contact her gynecologist for her Pap smear, pelvic exam and breast exam.  She declines doing this here today.  -Diet: variety of foods, balance and well rounded, larger portion sizes -Exercise: no regular exercise -Taking folic acid, vitamin D or calcium: no -Diabetes and Dyslipidemia Screening: Labs already done -Vaccines: see vaccine section EPIC -pap history: sees Dr. Quincy Simmonds - last pap in system 06-2014, last cpe with gyn in system 2016, hx abnormal pap -she reports she did this 2 years ago and that it was normal, reports she will contact her gynecologist for her yearly exam, declines a Pap smear here today -FDLMP: see nursing notes -sexual activity: yes, female partner, no new partners -wants STI testing (Hep C if born 18-65): no -FH breast, colon or ovarian ca: see FH Last mammogram: Declined, she does with her gynecologist Last colon cancer screening: Not applicable Breast Ca Risk Assessment: see family history and pt history DEXA (>/= 65): Not applicable  -Alcohol, Tobacco, drug use: see social history  Review of Systems - no fevers, unintentional weight loss, vision loss, hearing loss, chest pain, sob, hemoptysis, melena, hematochezia, hematuria, genital discharge, changing or concerning skin lesions, bleeding, bruising, loc, thoughts of self harm or SI  Past Medical History:  Diagnosis Date  . Anxiety   . Bipolar 1 disorder ([place])   . Chronic pain    back, hip - seeing PMR  . Depression   . Dysmenorrhea   . Hypertension   . Obesity     Past Surgical History:  Procedure Laterality Date  . KIDNEY STONE SURGERY  2007  . KNEE  ARTHROSCOPY Right 2003  . KNEE ARTHROSCOPY Right 2007  . SHOULDER ARTHROSCOPY Right 2007  . WISDOM TOOTH EXTRACTION  Highschool    Family History  Problem Relation Age of Onset  . Addison's disease Mother   . Diabetes Mother   . Bipolar disorder Mother   . Fibroids Maternal Aunt     Social History   Socioeconomic History  . Marital status: Single    Spouse name: None  . Number of children: None  . Years of education: None  . Highest education level: None  Social Needs  . Financial resource strain: None  . Food insecurity - worry: None  . Food insecurity - inability: None  . Transportation needs - medical: None  . Transportation needs - non-medical: None  Occupational History  . None  Tobacco Use  . Smoking status: Former Research scientist (life sciences)  . Smokeless tobacco: Never Used  Substance and Sexual Activity  . Alcohol use: No    Alcohol/week: 0.0 oz  . Drug use: No  . Sexual activity: Not Currently    Partners: Male    Birth control/protection: Abstinence, IUD    Comment: Mirena IUD inserted 06-21-15  Other Topics Concern  . None  Social History Narrative   Work or School: Armed forces technical officer      Home Situation: roommate      Spiritual Beliefs:mormon      Lifestyle: no regular exercise; horrible diet              Current Outpatient Medications:  .  amLODipine (  NORVASC) 5 MG tablet, TAKE 1 TABLET BY MOUTH EVERY DAY, Disp: 30 tablet, Rfl: 5 .  busPIRone (BUSPAR) 10 MG tablet, Take 20 mg by mouth 2 (two) times daily. , Disp: , Rfl:  .  cyclobenzaprine (FLEXERIL) 10 MG tablet, Take 1 tablet (10 mg total) by mouth 3 (three) times daily., Disp: 270 tablet, Rfl: 1 .  doxepin (SINEQUAN) 10 MG capsule, , Disp: , Rfl: 0 .  FLUoxetine (PROZAC) 40 MG capsule, Take 40 mg by mouth daily., Disp: , Rfl:  .  gabapentin (NEURONTIN) 300 MG capsule, Take 1 capsule (300 mg total) by mouth 2 (two) times daily., Disp: 180 capsule, Rfl: 1 .  lamoTRIgine (LAMICTAL) 150 MG tablet,  Take 150 mg by mouth. 2 tabs at bedtime, Disp: , Rfl:  .  levonorgestrel (MIRENA) 20 MCG/24HR IUD, 1 each by Intrauterine route once., Disp: , Rfl:  .  lithium carbonate 300 MG capsule, Take 300 mg by mouth 2 (two) times daily with a meal. , Disp: , Rfl: 1 .  rizatriptan (MAXALT-MLT) 5 MG disintegrating tablet, Take 1 tablet (5 mg total) by mouth as needed., Disp: 10 tablet, Rfl: 3 .  traMADol (ULTRAM) 50 MG tablet, TAKE 1 TABLET BY MOUTH EVERY 6 HOURS AS NEEDED, Disp: 90 tablet, Rfl: 0  EXAM:  Vitals:   11/15/17 0907  BP: 110/72  Pulse: 90  Temp: 98.7 F (37.1 C)   Body mass index is 41.14 kg/m.  GENERAL: vitals reviewed and listed below, alert, oriented, appears well hydrated and in no acute distress  HEENT: head atraumatic, PERRLA, normal appearance of eyes, ears, nose and mouth. moist mucus membranes.  NECK: supple, no masses or lymphadenopathy  LUNGS: clear to auscultation bilaterally, no rales, rhonchi or wheeze  CV: HRRR, no peripheral edema or cyanosis, normal pedal pulses  ABDOMEN: bowel sounds normal, soft, non tender to palpation, no masses, no rebound or guarding  GU/BREAST: Declined, reports she will do with her gynecologist  SKIN: no rash or abnormal lesions  MS: normal gait, moves all extremities normally  NEURO: normal gait, speech and thought processing grossly intact, muscle tone grossly intact throughout  PSYCH: normal affect, pleasant and cooperative  ASSESSMENT AND PLAN:  Discussed the following assessment and plan:  PREVENTIVE EXAM: -Discussed and advised all [place] preventive services health task force level A and B recommendations for age, sex and risks. -Advised at least 150 minutes of exercise per week and a healthy diet with avoidance of (less then 1 serving per week) processed foods, white starches, red meat, fast foods and sweets and consisting of: * 5-9 servings of fresh fruits and vegetables (not corn or potatoes) *nuts and seeds,  beans *olives and olive oil *lean meats such as fish and white chicken  *whole grains -labs, studies and vaccines per orders this encounter  2. Screening for depression -Sees psychiatrist for management, declines any help here today, declines any change or severe symptoms  3. BMI 40.0-44.9, adult ([place]) -Lifestyle recommendations, advised a 10-20 pound weight loss over the next 4-6 months  4. Essential hypertension, benign -Much better on recheck, continue current treatment, lifestyle changes  5. Hyperlipidemia, unspecified hyperlipidemia type -Lifestyle changes advised   Patient advised to return to clinic immediately if symptoms worsen or persist or new concerns.  Patient Instructions  BEFORE YOU LEAVE: -follow up: 4 months  Please call your gynecology office today to set up your annual exam and Pap smear.  We recommend the following healthy lifestyle for LIFE: -please work  on a 10-20 lb weight reduction over the next 4-6 months  1) Small portions. But, make sure to get regular (at least 3 per day), healthy meals and small healthy snacks if needed.  2) Eat a healthy clean diet.   TRY TO EAT: -at least 5-7 servings of low sugar, colorful, and nutrient rich vegetables per day (not corn, potatoes or bananas.) -berries are the best choice if you wish to eat fruit (only eat small amounts if trying to reduce weight)  -lean meets (fish, white meat of chicken or [place]) -vegan proteins for some meals - beans or tofu, whole grains, nuts and seeds -Replace bad fats with good fats - good fats include: fish, nuts and seeds, canola oil, olive oil -small amounts of low fat or non fat dairy -small amounts of100 % whole grains - check the lables -drink plenty of water  AVOID: -SUGAR, sweets, anything with added sugar, corn syrup or sweeteners - must read labels as even foods advertised as "healthy" often are loaded with sugar -if you must have a sweetener, small amounts of stevia may  be best -sweetened beverages and artificially sweetened beverages -simple starches (rice, bread, potatoes, pasta, chips, etc - small amounts of 100% whole grains are ok) -red meat, pork, butter -fried foods, fast food, processed food, excessive dairy, eggs and coconut.  3)Get at least 150 minutes of sweaty aerobic exercise per week.  4)Reduce stress - consider counseling, meditation and relaxation to balance other aspects of your life.   Preventive Care 40-64 Years, Female Preventive care refers to lifestyle choices and visits with your health care provider that can promote health and wellness. What does preventive care include?  A yearly physical exam. This is also called an annual well check.  Dental exams once or twice a year.  Routine eye exams. Ask your health care provider how often you should have your eyes checked.  Personal lifestyle choices, including: ? Daily care of your teeth and gums. ? Regular physical activity. ? Eating a healthy diet. ? Avoiding tobacco and drug use. ? Limiting alcohol use. ? Practicing safe sex. ? Taking low-dose aspirin daily starting at age 58. ? Taking vitamin and mineral supplements as recommended by your health care provider. What happens during an annual well check? The services and screenings done by your health care provider during your annual well check will depend on your age, overall health, lifestyle risk factors, and family history of disease. Counseling Your health care provider may ask you questions about your:  Alcohol use.  Tobacco use.  Drug use.  Emotional well-being.  Home and relationship well-being.  Sexual activity.  Eating habits.  Work and work Statistician.  Method of birth control.  Menstrual cycle.  Pregnancy history.  Screening You may have the following tests or measurements:  Height, weight, and BMI.  Blood pressure.  Lipid and cholesterol levels. These may be checked every 5 years, or  more frequently if you are over 5 years old.  Skin check.  Lung cancer screening. You may have this screening every year starting at age 53 if you have a 30-pack-year history of smoking and currently smoke or have quit within the past 15 years.  Fecal occult blood test (FOBT) of the stool. You may have this test every year starting at age 62.  Flexible sigmoidoscopy or colonoscopy. You may have a sigmoidoscopy every 5 years or a colonoscopy every 10 years starting at age 24.  Hepatitis C blood test.  Hepatitis B  blood test.  Sexually transmitted disease (STD) testing.  Diabetes screening. This is done by checking your blood sugar (glucose) after you have not eaten for a while (fasting). You may have this done every 1-3 years.  Mammogram. This may be done every 1-2 years. Talk to your health care provider about when you should start having regular mammograms. This may depend on whether you have a family history of breast cancer.  BRCA-related cancer screening. This may be done if you have a family history of breast, ovarian, tubal, or peritoneal cancers.  Pelvic exam and Pap test. This may be done every 3 years starting at age 65. Starting at age 91, this may be done every 5 years if you have a Pap test in combination with an HPV test.  Bone density scan. This is done to screen for osteoporosis. You may have this scan if you are at high risk for osteoporosis.  Discuss your test results, treatment options, and if necessary, the need for more tests with your health care provider. Vaccines Your health care provider may recommend certain vaccines, such as:  Influenza vaccine. This is recommended every year.  Tetanus, diphtheria, and acellular pertussis (Tdap, Td) vaccine. You may need a Td booster every 10 years.  Varicella vaccine. You may need this if you have not been vaccinated.  Zoster vaccine. You may need this after age 28.  Measles, mumps, and rubella (MMR) vaccine. You may  need at least one dose of MMR if you were born in 1957 or later. You may also need a second dose.  Pneumococcal 13-valent conjugate (PCV13) vaccine. You may need this if you have certain conditions and were not previously vaccinated.  Pneumococcal polysaccharide (PPSV23) vaccine. You may need one or two doses if you smoke cigarettes or if you have certain conditions.  Meningococcal vaccine. You may need this if you have certain conditions.  Hepatitis A vaccine. You may need this if you have certain conditions or if you travel or work in places where you may be exposed to hepatitis A.  Hepatitis B vaccine. You may need this if you have certain conditions or if you travel or work in places where you may be exposed to hepatitis B.  Haemophilus influenzae type b (Hib) vaccine. You may need this if you have certain conditions.  Talk to your health care provider about which screenings and vaccines you need and how often you need them. This information is not intended to replace advice given to you by your health care provider. Make sure you discuss any questions you have with your health care provider. Document Released: 10/04/2015 Document Revised: 05/27/2016 Document Reviewed: 07/09/2015 Elsevier Interactive Patient Education  2018 [employer].    No Follow-up on file.  Lucretia Kern, DO

## 2017-11-15 ENCOUNTER — Encounter: Payer: Self-pay | Admitting: Family Medicine

## 2017-11-15 ENCOUNTER — Ambulatory Visit (INDEPENDENT_AMBULATORY_CARE_PROVIDER_SITE_OTHER): Payer: BLUE CROSS/BLUE SHIELD | Admitting: Family Medicine

## 2017-11-15 VITALS — BP 110/72 | HR 90 | Temp 98.7°F | Ht 65.0 in | Wt 247.2 lb

## 2017-11-15 DIAGNOSIS — Z1331 Encounter for screening for depression: Secondary | ICD-10-CM | POA: Diagnosis not present

## 2017-11-15 DIAGNOSIS — I10 Essential (primary) hypertension: Secondary | ICD-10-CM

## 2017-11-15 DIAGNOSIS — E785 Hyperlipidemia, unspecified: Secondary | ICD-10-CM

## 2017-11-15 DIAGNOSIS — Z6841 Body Mass Index (BMI) 40.0 and over, adult: Secondary | ICD-10-CM

## 2017-11-15 DIAGNOSIS — Z0000 Encounter for general adult medical examination without abnormal findings: Secondary | ICD-10-CM | POA: Diagnosis not present

## 2017-11-15 NOTE — Patient Instructions (Addendum)
BEFORE YOU LEAVE: -follow up: 4 months  Please call your gynecology office today to set up your annual exam and Pap smear.  We recommend the following healthy lifestyle for LIFE: -please work on a 10-20 lb weight reduction over the next 4-6 months  1) Small portions. But, make sure to get regular (at least 3 per day), healthy meals and small healthy snacks if needed.  2) Eat a healthy clean diet.   TRY TO EAT: -at least 5-7 servings of low sugar, colorful, and nutrient rich vegetables per day (not corn, potatoes or bananas.) -berries are the best choice if you wish to eat fruit (only eat small amounts if trying to reduce weight)  -lean meets (fish, white meat of chicken or [place]) -vegan proteins for some meals - beans or tofu, whole grains, nuts and seeds -Replace bad fats with good fats - good fats include: fish, nuts and seeds, canola oil, olive oil -small amounts of low fat or non fat dairy -small amounts of100 % whole grains - check the lables -drink plenty of water  AVOID: -SUGAR, sweets, anything with added sugar, corn syrup or sweeteners - must read labels as even foods advertised as "healthy" often are loaded with sugar -if you must have a sweetener, small amounts of stevia may be best -sweetened beverages and artificially sweetened beverages -simple starches (rice, bread, potatoes, pasta, chips, etc - small amounts of 100% whole grains are ok) -red meat, pork, butter -fried foods, fast food, processed food, excessive dairy, eggs and coconut.  3)Get at least 150 minutes of sweaty aerobic exercise per week.  4)Reduce stress - consider counseling, meditation and relaxation to balance other aspects of your life.   Preventive Care 40-64 Years, Female Preventive care refers to lifestyle choices and visits with your health care provider that can promote health and wellness. What does preventive care include?  A yearly physical exam. This is also called an annual well  check.  Dental exams once or twice a year.  Routine eye exams. Ask your health care provider how often you should have your eyes checked.  Personal lifestyle choices, including: ? Daily care of your teeth and gums. ? Regular physical activity. ? Eating a healthy diet. ? Avoiding tobacco and drug use. ? Limiting alcohol use. ? Practicing safe sex. ? Taking low-dose aspirin daily starting at age 81. ? Taking vitamin and mineral supplements as recommended by your health care provider. What happens during an annual well check? The services and screenings done by your health care provider during your annual well check will depend on your age, overall health, lifestyle risk factors, and family history of disease. Counseling Your health care provider may ask you questions about your:  Alcohol use.  Tobacco use.  Drug use.  Emotional well-being.  Home and relationship well-being.  Sexual activity.  Eating habits.  Work and work Statistician.  Method of birth control.  Menstrual cycle.  Pregnancy history.  Screening You may have the following tests or measurements:  Height, weight, and BMI.  Blood pressure.  Lipid and cholesterol levels. These may be checked every 5 years, or more frequently if you are over 48 years old.  Skin check.  Lung cancer screening. You may have this screening every year starting at age 62 if you have a 30-pack-year history of smoking and currently smoke or have quit within the past 15 years.  Fecal occult blood test (FOBT) of the stool. You may have this test every year starting at  age 46.  Flexible sigmoidoscopy or colonoscopy. You may have a sigmoidoscopy every 5 years or a colonoscopy every 10 years starting at age 44.  Hepatitis C blood test.  Hepatitis B blood test.  Sexually transmitted disease (STD) testing.  Diabetes screening. This is done by checking your blood sugar (glucose) after you have not eaten for a while (fasting).  You may have this done every 1-3 years.  Mammogram. This may be done every 1-2 years. Talk to your health care provider about when you should start having regular mammograms. This may depend on whether you have a family history of breast cancer.  BRCA-related cancer screening. This may be done if you have a family history of breast, ovarian, tubal, or peritoneal cancers.  Pelvic exam and Pap test. This may be done every 3 years starting at age 77. Starting at age 36, this may be done every 5 years if you have a Pap test in combination with an HPV test.  Bone density scan. This is done to screen for osteoporosis. You may have this scan if you are at high risk for osteoporosis.  Discuss your test results, treatment options, and if necessary, the need for more tests with your health care provider. Vaccines Your health care provider may recommend certain vaccines, such as:  Influenza vaccine. This is recommended every year.  Tetanus, diphtheria, and acellular pertussis (Tdap, Td) vaccine. You may need a Td booster every 10 years.  Varicella vaccine. You may need this if you have not been vaccinated.  Zoster vaccine. You may need this after age 56.  Measles, mumps, and rubella (MMR) vaccine. You may need at least one dose of MMR if you were born in 1957 or later. You may also need a second dose.  Pneumococcal 13-valent conjugate (PCV13) vaccine. You may need this if you have certain conditions and were not previously vaccinated.  Pneumococcal polysaccharide (PPSV23) vaccine. You may need one or two doses if you smoke cigarettes or if you have certain conditions.  Meningococcal vaccine. You may need this if you have certain conditions.  Hepatitis A vaccine. You may need this if you have certain conditions or if you travel or work in places where you may be exposed to hepatitis A.  Hepatitis B vaccine. You may need this if you have certain conditions or if you travel or work in places where  you may be exposed to hepatitis B.  Haemophilus influenzae type b (Hib) vaccine. You may need this if you have certain conditions.  Talk to your health care provider about which screenings and vaccines you need and how often you need them. This information is not intended to replace advice given to you by your health care provider. Make sure you discuss any questions you have with your health care provider. Document Released: 10/04/2015 Document Revised: 05/27/2016 Document Reviewed: 07/09/2015 Elsevier Interactive Patient Education  [employer].

## 2017-12-13 ENCOUNTER — Other Ambulatory Visit: Payer: Self-pay | Admitting: *Deleted

## 2017-12-13 MED ORDER — TRAMADOL HCL 50 MG PO TABS: 50.0000 mg | ORAL_TABLET | Freq: Four times a day (QID) | ORAL | 0 refills | Status: DC | PRN

## 2018-01-01 ENCOUNTER — Other Ambulatory Visit: Payer: Self-pay | Admitting: Physical Medicine & Rehabilitation

## 2018-01-03 NOTE — Telephone Encounter (Signed)
Recieved electronic medication refill request for cyclobenzaprine and gabapentin.  Last mention of these medications was on last follow up visit on 01-18-2017, no mention of either medications since that time. Also that same visit it was mentioned:  She is already on cyclobenzaprine 10 3 times a day on a chronic basis, will stop this and switched to methocarbamol 500 3 times a day over the next 2 weeks.  Please advise and update on wether it is ok or not to refill these medications.

## 2018-01-13 ENCOUNTER — Telehealth: Payer: Self-pay | Admitting: Family Medicine

## 2018-01-13 ENCOUNTER — Other Ambulatory Visit: Payer: Self-pay | Admitting: Physical Medicine & Rehabilitation

## 2018-01-13 NOTE — Telephone Encounter (Signed)
Copied from CRM [phone]. Topic: Quick Communication - See Telephone Encounter >> Jan 13, 2018  9:47 AM Lorrine KinMcGee, Jennae Hakeem B, NT wrote: CRM for notification. See Telephone encounter for: 01/13/18. Patient calling and is requesting a call back about whether she needs the measles booster or not? CB#: [phone]

## 2018-01-13 NOTE — Telephone Encounter (Signed)
Recieved electronic medication refill request for Tramadol,  No mention in any notes dating as far back as oct of 2017 about using or continuing to use this medication.  Unsure if a refill is authorizied given this information.  Please advise.

## 2018-01-13 NOTE — Telephone Encounter (Signed)
I left a message for the pt to return my call. 

## 2018-01-13 NOTE — Telephone Encounter (Signed)
I would think she would have had in the past?

## 2018-01-16 NOTE — Telephone Encounter (Signed)
Will need UDS and CSA next visit

## 2018-01-27 ENCOUNTER — Encounter: Payer: BLUE CROSS/BLUE SHIELD | Attending: Physical Medicine & Rehabilitation

## 2018-01-27 ENCOUNTER — Ambulatory Visit: Payer: BLUE CROSS/BLUE SHIELD | Admitting: Physical Medicine & Rehabilitation

## 2018-01-27 ENCOUNTER — Encounter: Payer: Self-pay | Admitting: Physical Medicine & Rehabilitation

## 2018-01-27 VITALS — BP 136/89 | HR 87 | Ht 65.0 in | Wt 243.6 lb

## 2018-01-27 DIAGNOSIS — M533 Sacrococcygeal disorders, not elsewhere classified: Secondary | ICD-10-CM | POA: Diagnosis not present

## 2018-01-27 MED ORDER — TRAMADOL HCL 50 MG PO TABS: 50.0000 mg | ORAL_TABLET | Freq: Three times a day (TID) | ORAL | 5 refills | Status: DC

## 2018-01-27 NOTE — Progress Notes (Signed)
Bilateral sacroiliac injections under fluoroscopic guidance  Indication: Low back and buttocks pain not relieved by medication management and other conservative care.  Informed consent was obtained after describing risks and benefits of the procedure with the patient, this includes bleeding, bruising, infection, paralysis and medication side effects. The patient wishes to proceed and has given written consent. The patient was placed in a prone position. The lumbar and sacral area was marked and prepped with Betadine. A 25-gauge 1-1/2 inch needle was inserted into the skin and subcutaneous tissue and 1 mL of 1% lidocaine was injected into each side. Then a 25-gauge 3 inch spinal needle was inserted under fluoroscopic guidance into the left sacroiliac joint. AP and lateral images were utilized. Omnipaque 180x0.5 mL under live fluoroscopy demonstrated no intravascular uptake. Then a solution containing one ML of 6 mg per mL Celestone in 2 ML of 2% lidocaine MPF was injected x1.5 mL. This same procedure was repeated on the right side using the same needle, injectate, and technique. Patient tolerated the procedure well. Post procedure instructions were given. Please see post procedure form.  Will se pt in one month to f/u on complaints of intermittent pain down RLE

## 2018-01-27 NOTE — Patient Instructions (Signed)
Sacroiliac injection was performed today. A combination of a naming medicine plus a cortisone medicine was injected. The injection was done under x-ray guidance. This procedure has been performed to help reduce low back and buttocks pain as well as potentially hip pain. The duration of this injection is variable lasting from hours to  Months. It may repeated if needed. 

## 2018-01-27 NOTE — Progress Notes (Signed)
  PROCEDURE RECORD [facility]   Name: Leighana Neyman DOB:10-19-1975 MRN: [id number]  Date:01/27/2018  Physician: Claudette Laws, MD    Nurse/CMA: Bright CMA  Allergies:  Allergies  Allergen Reactions  . Clindamycin/Lincomycin Nausea And Vomiting  . Nubain [Nalbuphine Hcl] Other (See Comments)    *feels like something crawling on her*    Consent Signed: Yes.    Is patient diabetic? No.  CBG today? NA  Pregnant: No. LMP: No LMP recorded. (Menstrual status: IUD). (age 41-55)  Anticoagulants: no Anti-inflammatory: yes (advil, last night) Antibiotics: no  Procedure: Bilateral Sacroiliac inj  Position: Prone   Start Time: 1126am End Time: 1133am Fluoro Time: 36s  RN/CMA Bright CMA Bright CMA    Time 1057am 1140am    BP 136/89 147/91    Pulse 87 84    Respirations 16 16    O2 Sat 97 97    S/S 6 6    Pain Level 7/10 7/10     D/C home with Self, patient A & O X 3, D/C instructions reviewed, and sits independently.

## 2018-02-28 ENCOUNTER — Other Ambulatory Visit: Payer: Self-pay

## 2018-02-28 ENCOUNTER — Encounter: Payer: Self-pay | Admitting: Physical Medicine & Rehabilitation

## 2018-02-28 ENCOUNTER — Encounter: Payer: BLUE CROSS/BLUE SHIELD | Attending: Physical Medicine & Rehabilitation

## 2018-02-28 ENCOUNTER — Ambulatory Visit: Payer: BLUE CROSS/BLUE SHIELD | Admitting: Physical Medicine & Rehabilitation

## 2018-02-28 VITALS — BP 130/83 | HR 95 | Ht 65.0 in | Wt 239.2 lb

## 2018-02-28 DIAGNOSIS — M533 Sacrococcygeal disorders, not elsewhere classified: Secondary | ICD-10-CM | POA: Insufficient documentation

## 2018-02-28 DIAGNOSIS — M545 Low back pain, unspecified: Secondary | ICD-10-CM

## 2018-02-28 DIAGNOSIS — G8929 Other chronic pain: Secondary | ICD-10-CM | POA: Diagnosis not present

## 2018-02-28 MED ORDER — TRAMADOL HCL 50 MG PO TABS: 50.0000 mg | ORAL_TABLET | Freq: Four times a day (QID) | ORAL | 1 refills | Status: DC

## 2018-02-28 MED ORDER — KETOROLAC TROMETHAMINE 60 MG/2ML IM SOLN
60.0000 mg | Freq: Once | INTRAMUSCULAR | Status: AC
Start: 2018-02-28 — End: 2018-02-28
  Administered 2018-02-28: 60 mg via INTRAMUSCULAR

## 2018-02-28 NOTE — Progress Notes (Signed)
Subjective:    Patient ID: Maria Powers, female    DOB: May 31, 1976, 42 y.o.   MRN: [id number]  HPI  42 year old female with history of chronic right greater than left-sided low back pain onset at age 74 in the peripartum period.  She has had lumbar medial branch blocks which were not helpful.  Trigger point injections were helpful during exacerbations in the past.  Also patient has had good and fairly prolonged relief of a couple months after sacroiliac injections which have been performed every 3 months.  Last injection was performed approximately 1 month ago Had a great deal of pain before  Injection   After injection she hurt really bad immediately after but that night got good relief that lasted 1 1/2 weeks.  She is having a lot of pain now.  Still has pain down the right LE Pain in the buttocks to Leg  No tingling or weakness in RLE  Patient has occasional pain in the right groin but no pain in the knee.  She has been able to ambulate as well as work however after work she usually goes home and sits down right away No bowel or bladder dysfunction.   Pain Inventory Average Pain 9 Pain Right Now 9 My pain is sharp, burning and aching  In the last 24 hours, has pain interfered with the following? General activity 9 Relation with others 9 Enjoyment of life 9 What TIME of day is your pain at its worst? n/a Sleep (in general) Poor  Pain is worse with: walking, bending and standing Pain improves with: medication Relief from Meds: 4  Mobility Do you have any goals in this area?  no  Function Do you have any goals in this area?  no  Neuro/Psych No problems in this area  Prior Studies Any changes since last visit?  no  Physicians involved in your care Any changes since last visit?  no   Family History  Problem Relation Age of Onset  . Addison's disease Mother   . Diabetes Mother   . Bipolar disorder Mother   . Fibroids Maternal Aunt    Social History    Socioeconomic History  . Marital status: Single    Spouse name: Not on file  . Number of children: Not on file  . Years of education: Not on file  . Highest education level: Not on file  Occupational History  . Not on file  Social Needs  . Financial resource strain: Not on file  . Food insecurity:    Worry: Not on file    Inability: Not on file  . Transportation needs:    Medical: Not on file    Non-medical: Not on file  Tobacco Use  . Smoking status: Former Games developer  . Smokeless tobacco: Never Used  Substance and Sexual Activity  . Alcohol use: No    Alcohol/week: 0.0 oz  . Drug use: No  . Sexual activity: Not Currently    Partners: Male    Birth control/protection: Abstinence, IUD    Comment: Mirena IUD inserted 06-21-15  Lifestyle  . Physical activity:    Days per week: Not on file    Minutes per session: Not on file  . Stress: Not on file  Relationships  . Social connections:    Talks on phone: Not on file    Gets together: Not on file    Attends religious service: Not on file    Active member of club or organization: Not on  file    Attends meetings of clubs or organizations: Not on file    Relationship status: Not on file  Other Topics Concern  . Not on file  Social History Narrative   Work or School: Pharmacist, hospital      Home Situation: roommate      Spiritual Beliefs:mormon      Lifestyle: no regular exercise; horrible diet            Past Surgical History:  Procedure Laterality Date  . KIDNEY STONE SURGERY  2007  . KNEE ARTHROSCOPY Right 2003  . KNEE ARTHROSCOPY Right 2007  . SHOULDER ARTHROSCOPY Right 2007  . WISDOM TOOTH EXTRACTION  Highschool   Past Medical History:  Diagnosis Date  . Anxiety   . Bipolar 1 disorder (HCC)   . Chronic pain    back, hip - seeing PMR  . Depression   . Dysmenorrhea   . Hypertension   . Obesity    BP 130/83   Pulse 95   Ht 5\' 5"  (1.651 m)   Wt 239 lb 3.2 oz (108.5 kg)   SpO2 96%   BMI  39.80 kg/m   Opioid Risk Score:   Fall Risk Score:  `1  Depression screen PHQ 2/9  Depression screen [facility] 2/9 02/28/2018 11/15/2017 10/24/2015 06/18/2015 01/03/2015 02/27/2013  Decreased Interest 1 0 1 1 1  0  Down, Depressed, Hopeless 1 1 1 1 1  -  PHQ - 2 Score 2 1 2 2 2  0  Altered sleeping - - - - 3 -  Tired, decreased energy - - - - 2 -  Change in appetite - - - - 0 -  Feeling bad or failure about yourself  - - - - 1 -  Trouble concentrating - - - - 1 -  Moving slowly or fidgety/restless - - - - 0 -  Suicidal thoughts - - - - 0 -  PHQ-9 Score - - - - 9 -    Review of Systems  Constitutional: Negative.   HENT: Negative.   Eyes: Negative.   Respiratory: Negative.   Cardiovascular: Negative.   Gastrointestinal: Negative.   Endocrine: Negative.   Genitourinary: Negative.   Musculoskeletal: Positive for back pain.  Skin: Negative.   Neurological: Negative.   Hematological: Negative.   Psychiatric/Behavioral: Negative.   All other systems reviewed and are negative.      Objective:   Physical Exam  Constitutional: She is oriented to person, place, and time. She appears well-developed and well-nourished.  Overweight female no acute distress  HENT:  Head: Normocephalic and atraumatic.  Eyes: Pupils are equal, round, and reactive to light. EOM are normal.  Neck: Normal range of motion.  Musculoskeletal:  Patient has buttocks but no groin pain with hip internal and external rotation on the right side.  There is some limitation of internal rotation but no limitation of external rotation.  Patient also has pain in the PSIS area with a Faber's maneuver bilaterally. Negative straight leg raising  Neurological: She is alert and oriented to person, place, and time. She displays no atrophy. No sensory deficit. She exhibits normal muscle tone. Gait normal.  Reflex Scores:      Patellar reflexes are 2+ on the right side and 2+ on the left side.      Achilles reflexes are 1+ on the right  side and 1+ on the left side. Sensation intact bilateral lower extremities light touch and pinprick Motor strength is 5/5 bilateral hip flexor  knee extensor ankle dorsiflexor Gait is without evidence of toe drag or knee instability.   Tenderness starting at L4 down through PSIS area right-sided groin left side       Assessment & Plan:  1.  Acute exacerbation of chronic lumbar pain.  She has responded well in the past to sacroiliac injections and in fact the last injection was helpful as well but did not have the same duration of effect. Patient has some symptoms that may be new or such as increasing right lower extremity pain.  Neuro exam however shows no evidence of radiculopathy. We discussed recommendation for physical therapy Toradol 60 mg IM today Return to clinic 1 month, if not much better would consider MRI to see if the newer symptoms may be more disc related given that they are around L4 In terms of injections would hold off for now.  Would consider L5 dorsal ramus injection S1-S2-S3 lateral branch blocks as a means of assessing potential efficacy of radiofrequency of the same nerves.  This may give a more prolonged effect i.e. 6 to 12 months

## 2018-02-28 NOTE — Patient Instructions (Addendum)
Toradol, today PT, ordered they will call you Lumbar xray may go at your convenience

## 2018-03-04 ENCOUNTER — Ambulatory Visit
Admission: RE | Admit: 2018-03-04 | Discharge: 2018-03-04 | Disposition: A | Discharge status: Home / Self Care | Payer: BLUE CROSS/BLUE SHIELD | Source: Ambulatory Visit | Attending: Physical Medicine & Rehabilitation | Admitting: Physical Medicine & Rehabilitation

## 2018-03-04 DIAGNOSIS — G8929 Other chronic pain: Secondary | ICD-10-CM

## 2018-03-04 DIAGNOSIS — M545 Low back pain, unspecified: Secondary | ICD-10-CM

## 2018-03-07 ENCOUNTER — Other Ambulatory Visit: Payer: Self-pay

## 2018-03-07 ENCOUNTER — Ambulatory Visit: Payer: BLUE CROSS/BLUE SHIELD | Attending: Physical Medicine & Rehabilitation | Admitting: Physical Therapy

## 2018-03-07 ENCOUNTER — Encounter: Payer: Self-pay | Admitting: Physical Therapy

## 2018-03-07 DIAGNOSIS — M62830 Muscle spasm of back: Secondary | ICD-10-CM | POA: Diagnosis present

## 2018-03-07 DIAGNOSIS — R293 Abnormal posture: Secondary | ICD-10-CM | POA: Diagnosis present

## 2018-03-07 DIAGNOSIS — M25551 Pain in right hip: Secondary | ICD-10-CM

## 2018-03-07 DIAGNOSIS — M545 Low back pain: Secondary | ICD-10-CM

## 2018-03-08 NOTE — Therapy (Signed)
[facility] [address] [place], [place], [address] Phone: [phone]   Fax:  [phone]  Physical Therapy Evaluation  Patient Details  Name: Maria Powers MRN: [id number] Date of Birth: 06/16/1976 Referring Provider: Lynford Citizen, MD   Encounter Date: 03/07/2018  PT End of Session - 03/07/18 1627    Visit Number  1    Number of Visits  13    Date for PT Re-Evaluation  04/18/18    Authorization Type  BCBS    Authorization Time Period  03/07/18 to 04/18/18    PT Start Time  1538 pt arrived late     PT Stop Time  1614    PT Time Calculation (min)  36 min    Activity Tolerance  Patient limited by pain    Behavior During Therapy  [facility] for tasks assessed/performed       Past Medical History:  Diagnosis Date  . Anxiety   . Bipolar 1 disorder ([place])   . Chronic pain    back, hip - seeing PMR  . Depression   . Dysmenorrhea   . Hypertension   . Obesity     Past Surgical History:  Procedure Laterality Date  . KIDNEY STONE SURGERY  2007  . KNEE ARTHROSCOPY Right 2003  . KNEE ARTHROSCOPY Right 2007  . SHOULDER ARTHROSCOPY Right 2007  . WISDOM TOOTH EXTRACTION  Highschool    There were no vitals filed for this visit.   Subjective Assessment - 03/07/18 1550    Subjective  Pt reports that she noticed pain along her Rt hip which would occasionally go into the Rt low back and groin area. She got an injection last week which helped alleviate some of her pain. Overall, she is not sure what really helps.    Pertinent History  bipolar, depression, anxiety, Rt knee scope    Limitations  House hold activities;Lifting    Patient Stated Goals  decrease pain    Currently in Pain?  Yes    Pain Score  -- no rating given     Pain Location  Hip    Pain Orientation  Right;Lateral;Anterior    Pain Descriptors / Indicators  Tender;Aching    Pain Type  Acute pain    Pain Radiating Towards  around the hip towards the groin     Pain Onset  1 to 4 weeks ago    Pain Frequency  Intermittent    Aggravating Factors   bending, turning her body either direction    Pain Relieving Factors  injection          [facility] PT Assessment - 03/08/18 0001      Assessment   Medical Diagnosis  Sacroiliac disorder    Referring Provider  Lynford Citizen, MD    Onset Date/Surgical Date  -- 3 weeks ago     Prior Therapy  none       Precautions   Precautions  None      Balance Screen   Has the patient fallen in the past 6 months  No    Has the patient had a decrease in activity level because of a fear of falling?   No    Is the patient reluctant to leave their home because of a fear of falling?   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; bending, lifting and twisting       Cognition   Overall  Cognitive Status  Within Functional Limits for tasks assessed      Observation/Other Assessments   Observations  pt with slight lateral shift Lt    Focus on Therapeutic Outcomes (FOTO)   43% limited       Sensation   Additional Comments  no reports of numbness/tingling       Posture/Postural Control   Posture Comments  forward head, rounded shoulders, decreased lumbar lordosis       AROM   Lumbar Flexion  shfit Lt, pain coming up    Lumbar Extension  75% limited, pain end range     Lumbar - Right Rotation  20 deg  pain Rt hip/low back     Lumbar - Left Rotation  30 deg pain Rt hip/low back       Strength   Right Hip Flexion  4/5 (+) Rt posterior hip pain    Right Hip Extension  3/5    Right Hip ABduction  4/5    Left Hip Flexion  4/5    Left Hip Extension  3/5    Left Hip ABduction  4/5    Right/Left Knee  Right;Left    Right Knee Flexion  5/5    Right Knee Extension  5/5    Left Knee Flexion  5/5    Left Knee Extension  5/5      Flexibility   Soft Tissue Assessment /Muscle Length  yes    Hamstrings  WNL    Quadriceps  100 deg, pain Rt low back       Palpation   Palpation  comment  tender with palpation along the Rt gluteals/TFL, PSIS      Special Tests   Other special tests  (+) sacral compression        Objective measurements completed on examination: See above findings.       PT Education - 03/08/18 0725    Education Details  eval findings/POC; use of tennis ball to address muscle spasm    Person(s) Educated  Patient    Methods  Explanation;Demonstration    Comprehension  Verbalized understanding;Returned demonstration       PT Short Term Goals - 03/08/18 1127      PT SHORT TERM GOAL #1   Title  Pt will demo consistency and independece with her initial HEP to decrease pain and improve body mechanics with daily activity.     Time  2    Period  Weeks    Status  New    Target Date  03/21/18      PT SHORT TERM GOAL #2   Title  Pt will report atleast 25% improvement in her pain from the start of therapy.     Time  3    Period  Weeks    Status  New    Target Date  03/28/18        PT Long Term Goals - 03/08/18 1129      PT LONG TERM GOAL #1   Title  Pt will demo improved BLE strength to 5/5 MMT which will increase her ability to use proper mechanics with lifting boxes at work.     Time  6    Period  Weeks    Status  New    Target Date  04/18/18      PT LONG TERM GOAL #2   Title  Pt will report atleast 50% decrease in her pain from the start of therapy which will improve her quality  of life.     Time  6    Period  Weeks    Status  New      PT LONG TERM GOAL #3   Title  Pt will be able to demonstrate proper body mechanics when lifting a 10# box from the floor, 3/5 trials, without cuing from the therapist.     Time  6    Period  Weeks    Status  New      PT LONG TERM GOAL #4   Title  Pt will demo improved active lumbar rotation to atleast 45 degrees without increase in low back pain, which will allow her to complete sorting activity at work.     Time  6    Period  Weeks    Status  New             Plan - 03/07/18 1628     Clinical Impression Statement  Pt is a 42 y.o F with complaints of Rt side low back/hip pain onset approximately 3 weeks ago. Since the onset, she received an injection which seemed to help some, however she continues to have pain with lumbar ROM, hip ROM and with lifting activity at work. She has limitations of both hip and lumbar ROM upon evaluation, weakness of the proximal hip musculature, and tenderness with palpation along the PSIS and gluteals. She would benefit from skilled PT to address her limitations in ROM, strength, body mechanics and decrease pain to allow her to complete daily activity at home and work without as much difficulty.     Clinical Presentation  Stable    Clinical Presentation due to:  somewhat improved with injection last week     Clinical Decision Making  Low    Rehab Potential  Good    PT Frequency  2x / week    PT Duration  6 weeks    PT Treatment/Interventions  ADLs/Self Care Home Management;Electrical Stimulation;Cryotherapy;Traction;Ultrasound;Moist Heat;Iontophoresis 41m/ml Dexamethasone;Functional mobility training;Therapeutic exercise;Therapeutic activities    PT Next Visit Plan  f/u on tennis ball; assess hip extension and leg length with MET if needed; provide HEP for proximal hip and trunk strengthening    Consulted and Agree with Plan of Care  Patient       Patient will benefit from skilled therapeutic intervention in order to improve the following deficits and impairments:  Decreased activity tolerance, Impaired flexibility, Obesity, Decreased strength, Decreased range of motion, Increased muscle spasms, Pain, Improper body mechanics  Visit Diagnosis: Acute right-sided low back pain, with sciatica presence unspecified  Pain in right hip  Abnormal posture  Muscle spasm of back     Problem List Patient Active Problem List   Diagnosis Date Noted  . BMI 40.0-44.9, adult ([place] 10/16/2015  . Patellofemoral arthritis of right knee 09/02/2015  .  Sacroiliac dysfunction 08/14/2014  . Muscle pain, myofascial 08/14/2014  . Essential hypertension, benign 04/30/2014  . Menorrhagia 04/18/2014  . Dysmenorrhea 04/18/2014  . Migraine headache 07/10/2013  . Low back pain radiating to right leg 05/18/2012   11:35 AM,03/08/18 SSherol DadePT, DPT [facility] [address] [place] [address]Phone: [phone]  Fax:  [phone] Name: ARakayla RicklefsMRN: [id number] of Birth: 5Aug 18, 1977

## 2018-03-10 ENCOUNTER — Ambulatory Visit: Payer: BLUE CROSS/BLUE SHIELD | Admitting: Physical Therapy

## 2018-03-10 DIAGNOSIS — R293 Abnormal posture: Secondary | ICD-10-CM

## 2018-03-10 DIAGNOSIS — M545 Low back pain: Secondary | ICD-10-CM | POA: Diagnosis not present

## 2018-03-10 DIAGNOSIS — M25551 Pain in right hip: Secondary | ICD-10-CM

## 2018-03-10 DIAGNOSIS — M62830 Muscle spasm of back: Secondary | ICD-10-CM

## 2018-03-10 NOTE — Patient Instructions (Signed)
Access Code: [email or web address]  URL: [email or web address]  Date: 03/10/2018  Prepared by: Marylyn IshiharaSara Kiser   Exercises  Bridge - 15 reps - 2 sets - 1x daily - 7x weekly  Clamshell with Resistance - 10 reps - 2 sets - 2x daily - 7x weekly    [facility] [address], [address] [place], [place] [address] Phone # [phone] Fax [phone]

## 2018-03-10 NOTE — Therapy (Signed)
[facility] [address] [place], [place], [address] Phone: [phone]   Fax:  [phone]  Physical Therapy Treatment  Patient Details  Name: Maria Powers MRN: [id number] Date of Birth: Mar 19, 1976 Referring Provider: Loletta Parish, MD   Encounter Date: 03/10/2018  PT End of Session - 03/10/18 1010    Visit Number  2    Number of Visits  13    Date for PT Re-Evaluation  04/18/18    Authorization Type  BCBS    Authorization Time Period  03/07/18 to 04/18/18    PT Start Time  0930    PT Stop Time  1010    PT Time Calculation (min)  40 min    Activity Tolerance  Patient limited by pain    Behavior During Therapy  [facility] for tasks assessed/performed       Past Medical History:  Diagnosis Date  . Anxiety   . Bipolar 1 disorder ([facility])   . Chronic pain    back, hip - seeing PMR  . Depression   . Dysmenorrhea   . Hypertension   . Obesity     Past Surgical History:  Procedure Laterality Date  . KIDNEY STONE SURGERY  2007  . KNEE ARTHROSCOPY Right 2003  . KNEE ARTHROSCOPY Right 2007  . SHOULDER ARTHROSCOPY Right 2007  . WISDOM TOOTH EXTRACTION  Highschool    There were no vitals filed for this visit.  Subjective Assessment - 03/10/18 0932    Subjective  Pt reports that she was able to try the tennis ball the day after her eval, and applied heat and a patch. This seemed to help for an extra 20 minutes or so the following day. No pain currently.     Pertinent History  bipolar, depression, anxiety, Rt knee scope    Limitations  House hold activities;Lifting    Patient Stated Goals  decrease pain    Currently in Pain?  No/denies    Pain Onset  1 to 4 weeks ago         [facility] PT Assessment - 03/10/18 0001      Strength   Right Hip Extension  -- pain Rt glutes            [facility] Adult PT Treatment/Exercise - 03/10/18 0001      Self-Care   Self-Care  Other Self-Care Comments    Other Self-Care  Comments   set up and use of tennis ball for gluteal massage at home       Exercises   Exercises  Knee/Hip      Knee/Hip Exercises: Supine   Bridges  2 sets;10 reps    Bridges Limitations  yellow TB around knees     Other Supine Knee/Hip Exercises  straight leg hip extension isometric x3 sec hold for 10 reps each LE    Other Supine Knee/Hip Exercises  bent knee to chest with LE on red physioball x20 reps       Knee/Hip Exercises: Sidelying   Clams  green TB x15 reps each       Knee/Hip Exercises: Prone   Hamstring Curl  1 set;10 reps    Hamstring Curl Limitations  double yellow TB      Manual Therapy   Manual Therapy  Joint mobilization    Joint Mobilization  CPAs L3 to S2 x2 bouts  pain free end of treatment             PT Education -  03/10/18 1010    Education Details  updated HEP; tennis ball for home massage    Person(s) Educated  Patient    Methods  Explanation;Handout;Demonstration    Comprehension  Verbalized understanding;Returned demonstration       PT Short Term Goals - 03/08/18 1127      PT SHORT TERM GOAL #1   Title  Pt will demo consistency and independece with her initial HEP to decrease pain and improve body mechanics with daily activity.     Time  2    Period  Weeks    Status  New    Target Date  03/21/18      PT SHORT TERM GOAL #2   Title  Pt will report atleast 25% improvement in her pain from the start of therapy.     Time  3    Period  Weeks    Status  New    Target Date  03/28/18        PT Long Term Goals - 03/08/18 1129      PT LONG TERM GOAL #1   Title  Pt will demo improved BLE strength to 5/5 MMT which will increase her ability to use proper mechanics with lifting boxes at work.     Time  6    Period  Weeks    Status  New    Target Date  04/18/18      PT LONG TERM GOAL #2   Title  Pt will report atleast 50% decrease in her pain from the start of therapy which will improve her quality of life.     Time  6    Period  Weeks     Status  New      PT LONG TERM GOAL #3   Title  Pt will be able to demonstrate proper body mechanics when lifting a 10# box from the floor, 3/5 trials, without cuing from the therapist.     Time  6    Period  Weeks    Status  New      PT LONG TERM GOAL #4   Title  Pt will demo improved active lumbar rotation to atleast 45 degrees without increase in low back pain, which will allow her to complete sorting activity at work.     Time  6    Period  Weeks    Status  New            Plan - 03/10/18 1011    Clinical Impression Statement  Pt arrived today with questions regarding self massage at home. Therapist was able to review this with her and she demonstrated full understanding of this end of session. Completed sacral and lumbar mobilizations, noting hypomobility and pain initially which pain reports resolved following 2 bouts of central PAs to the region. Also established a hip strengthening program with minor adjustments needed initially for proper mechanics. Will continue with current POC.    Rehab Potential  Good    PT Frequency  2x / week    PT Duration  6 weeks    PT Treatment/Interventions  ADLs/Self Care Home Management;Electrical Stimulation;Cryotherapy;Traction;Ultrasound;Moist Heat;Iontophoresis 4mg /ml Dexamethasone;Functional mobility training;Therapeutic exercise;Therapeutic activities    PT Next Visit Plan  f/u on tennis ball; hip strengthening progression, trunk stretches (knees to chest, LTR), trunk strengthening    PT Home Exercise Plan  [email or web address]     Consulted and Agree with Plan of Care  Patient       Patient will benefit  from skilled therapeutic intervention in order to improve the following deficits and impairments:  Decreased activity tolerance, Impaired flexibility, Obesity, Decreased strength, Decreased range of motion, Increased muscle spasms, Pain, Improper body mechanics  Visit Diagnosis: Acute right-sided low back pain, with sciatica presence  unspecified  Pain in right hip  Abnormal posture  Muscle spasm of back     Problem List Patient Active Problem List   Diagnosis Date Noted  . BMI 40.0-44.9, adult (H[facility]) 10/16/2015  . Patellofemoral arthritis of right knee 09/02/2015  . Sacroiliac dysfunction 08/14/2014  . Muscle pain, myofascial 08/14/2014  . Essential hypertension, benign 04/30/2014  . Menorrhagia 04/18/2014  . Dysmenorrhea 04/18/2014  . Migraine headache 07/10/2013  . Low back pain radiating to right leg 05/18/2012    10:22 AM,03/10/18 Donita Brooks PT, DPT [facility]  [phone]  [facility] [address] [place], [place], [address] Phone: [phone]   Fax:  [phone]  Name: Maria Powers MRN: [id number] Date of Birth: 1975/10/11

## 2018-03-14 ENCOUNTER — Ambulatory Visit: Payer: BLUE CROSS/BLUE SHIELD | Admitting: Physical Therapy

## 2018-03-14 DIAGNOSIS — M62830 Muscle spasm of back: Secondary | ICD-10-CM

## 2018-03-14 DIAGNOSIS — M545 Low back pain: Secondary | ICD-10-CM

## 2018-03-14 DIAGNOSIS — M25551 Pain in right hip: Secondary | ICD-10-CM

## 2018-03-14 DIAGNOSIS — R293 Abnormal posture: Secondary | ICD-10-CM

## 2018-03-14 NOTE — Therapy (Signed)
[facility] [address] [place], [place], [address] Phone: [phone]   Fax:  [phone]  Physical Therapy Treatment  Patient Details  Name: Shyanna Klingel MRN: [id number] Date of Birth: 25-May-1976 Referring Provider: Loletta Parish, MD   Encounter Date: 03/14/2018  PT End of Session - 03/14/18 0951    Visit Number  3    Number of Visits  13    Date for PT Re-Evaluation  04/18/18    Authorization Type  BCBS    Authorization Time Period  03/07/18 to 04/18/18    PT Start Time  0933    PT Stop Time  1011    PT Time Calculation (min)  38 min    Activity Tolerance  Patient tolerated treatment well    Behavior During Therapy  [facility] for tasks assessed/performed       Past Medical History:  Diagnosis Date  . Anxiety   . Bipolar 1 disorder ([facility])   . Chronic pain    back, hip - seeing PMR  . Depression   . Dysmenorrhea   . Hypertension   . Obesity     Past Surgical History:  Procedure Laterality Date  . KIDNEY STONE SURGERY  2007  . KNEE ARTHROSCOPY Right 2003  . KNEE ARTHROSCOPY Right 2007  . SHOULDER ARTHROSCOPY Right 2007  . WISDOM TOOTH EXTRACTION  Highschool    There were no vitals filed for this visit.  Subjective Assessment - 03/14/18 0936    Subjective  Pt reports that things are going well. She needs another copy of the exercises. No pain currently.     Pertinent History  bipolar, depression, anxiety, Rt knee scope    Limitations  House hold activities;Lifting    Patient Stated Goals  decrease pain    Currently in Pain?  No/denies    Pain Onset  1 to 4 weeks ago               [facility] Adult PT Treatment/Exercise - 03/14/18 0001      Knee/Hip Exercises: Stretches   Piriformis Stretch  3 reps;Left;Right;30 seconds;Limitations    Piriformis Stretch Limitations  seated     Other Knee/Hip Stretches  single knee to chest each LE 5x10 sec each       Knee/Hip Exercises: Standing   Other  Standing Knee Exercises  hip abdution/adduction slide with BUE support on countertop x10 reps each       Knee/Hip Exercises: Seated   Other Seated Knee/Hip Exercises  single leg hip ER/IR with yellow TB around ankle x10 reps each LE    Other Seated Knee/Hip Exercises  trunk rotation strech x5 reps each for 10 sec hold       Knee/Hip Exercises: Supine   Other Supine Knee/Hip Exercises  hooklying (single knee extended) ball squeeze 10x2 sec hold each LE; straight leg hip extension isometric hold x2 sec for 15 reps each; hooklying low trunk rotation x15 reps Lt/Rt    Other Supine Knee/Hip Exercises  bent knee to chest with LE on red physioball x20 reps, pt assisting stretch x10 reps; oblique isometric press UE into LE 15 reps each side             PT Education - 03/14/18 0959    Education Details  updated HEP; noted improvements in movement during session    Person(s) Educated  Patient    Methods  Explanation;Handout    Comprehension  Verbalized understanding;Returned demonstration  PT Short Term Goals - 03/14/18 1003      PT SHORT TERM GOAL #1   Title  Pt will demo consistency and independece with her initial HEP to decrease pain and improve body mechanics with daily activity.     Time  2    Period  Weeks    Status  Achieved      PT SHORT TERM GOAL #2   Title  Pt will report atleast 25% improvement in her pain from the start of therapy.     Time  3    Period  Weeks    Status  New        PT Long Term Goals - 03/14/18 1004      PT LONG TERM GOAL #1   Title  Pt will demo improved BLE strength to 5/5 MMT which will increase her ability to use proper mechanics with lifting boxes at work.     Time  6    Period  Weeks    Status  On-going      PT LONG TERM GOAL #2   Title  Pt will report atleast 50% decrease in her pain from the start of therapy which will improve her quality of life.     Time  6    Period  Weeks    Status  On-going      PT LONG TERM GOAL #3    Title  Pt will be able to demonstrate proper body mechanics when lifting a 10# box from the floor, 3/5 trials, without cuing from the therapist.     Time  6    Period  Weeks    Status  On-going      PT LONG TERM GOAL #4   Title  Pt will demo improved active lumbar rotation to atleast 45 degrees without increase in low back pain, which will allow her to complete sorting activity at work.     Time  6    Period  Weeks    Status  On-going            Plan - 03/14/18 1011    Clinical Impression Statement  Pt arrived without reports of low back or hip pain today. She has been working on her HEP and using the tennis ball at home to address soft tissue restrictions along the buttock region. Pt was able to complete all of today's exercises without increase in hip/buttock pain.  Overall, therapist noting pt is able to complete bed mobility and stretches without issue compared to her guarded movement at the evaluation. Pt demonstrated good understanding of HEP updates made. Will continue with current POC.     Rehab Potential  Good    PT Frequency  2x / week    PT Duration  6 weeks    PT Treatment/Interventions  ADLs/Self Care Home Management;Electrical Stimulation;Cryotherapy;Traction;Ultrasound;Moist Heat;Iontophoresis 4mg /ml Dexamethasone;Functional mobility training;Therapeutic exercise;Therapeutic activities    PT Next Visit Plan  f/u on HEP; hip strengthening progression (seated and standing), trunk strengthening next session    PT Home Exercise Plan  [email or web address]     Consulted and Agree with Plan of Care  Patient       Patient will benefit from skilled therapeutic intervention in order to improve the following deficits and impairments:  Decreased activity tolerance, Impaired flexibility, Obesity, Decreased strength, Decreased range of motion, Increased muscle spasms, Pain, Improper body mechanics  Visit Diagnosis: Acute right-sided low back pain, with sciatica presence unspecified  Pain in  right  hip  Abnormal posture  Muscle spasm of back     Problem List Patient Active Problem List   Diagnosis Date Noted  . BMI 40.0-44.9, adult (HCC) 10/16/2015  . Patellofemoral arthritis of right knee 09/02/2015  . Sacroiliac dysfunction 08/14/2014  . Muscle pain, myofascial 08/14/2014  . Essential hypertension, benign 04/30/2014  . Menorrhagia 04/18/2014  . Dysmenorrhea 04/18/2014  . Migraine headache 07/10/2013  . Low back pain radiating to right leg 05/18/2012    10:15 AM,03/14/18 Donita BrooksSara Malyn Aytes PT, DPT [facility]  [phone]  [facility] [address] [place], [place], [address] Phone: [phone]   Fax:  [phone]  Name: Rosita Keadrianne Munar MRN: [id number] Date of Birth: 04/11/1976

## 2018-03-16 ENCOUNTER — Ambulatory Visit: Payer: BLUE CROSS/BLUE SHIELD | Admitting: Physical Therapy

## 2018-03-16 ENCOUNTER — Encounter: Payer: Self-pay | Admitting: Physical Therapy

## 2018-03-16 DIAGNOSIS — M545 Low back pain: Secondary | ICD-10-CM | POA: Diagnosis not present

## 2018-03-16 DIAGNOSIS — R293 Abnormal posture: Secondary | ICD-10-CM

## 2018-03-16 DIAGNOSIS — M62830 Muscle spasm of back: Secondary | ICD-10-CM

## 2018-03-16 DIAGNOSIS — M25551 Pain in right hip: Secondary | ICD-10-CM

## 2018-03-16 NOTE — Therapy (Addendum)
[facility] [address], [address] [place], [place], [address] Phone: [phone]   Fax:  [phone]  Physical Therapy Treatment  Patient Details  Name: Maria Powers MRN: [id number] Date of Birth: 11/09/1975 Referring Provider: Loletta ParishAndrew Kirstein, MD   Encounter Date: 03/16/2018  PT End of Session - 03/16/18 1146    Visit Number  4    Number of Visits  13    Date for PT Re-Evaluation  04/18/18    Authorization Type  BCBS    Authorization Time Period  03/07/18 to 04/18/18    PT Start Time  1101    PT Stop Time  1143    PT Time Calculation (min)  42 min    Activity Tolerance  Patient tolerated treatment well;No increased pain    Behavior During Therapy  WFL for tasks assessed/performed       Past Medical History:  Diagnosis Date  . Anxiety   . Bipolar 1 disorder (HCC)   . Chronic pain    back, hip - seeing PMR  . Depression   . Dysmenorrhea   . Hypertension   . Obesity     Past Surgical History:  Procedure Laterality Date  . KIDNEY STONE SURGERY  2007  . KNEE ARTHROSCOPY Right 2003  . KNEE ARTHROSCOPY Right 2007  . SHOULDER ARTHROSCOPY Right 2007  . WISDOM TOOTH EXTRACTION  Highschool    There were no vitals filed for this visit.  Subjective Assessment - 03/16/18 0932    Subjective  Pt reports that she did not complete her HEP as instructed because she had the burning pain from her buttock down the leg. It was worse with standing and repeatedly rotating to the Lt. She woke up today without pain.     Pertinent History  bipolar, depression, anxiety, Rt knee scope    Limitations  House hold activities;Lifting    Patient Stated Goals  decrease pain    Currently in Pain?  No/denies    Pain Onset  1 to 4 weeks ago                       [facility] Adult [facility] Treatment/Exercise - 03/16/18 0001      Knee/Hip Exercises: Seated   Other Seated Knee/Hip Exercises  anterior/posterior pelvic tilts x15 reps        Knee/Hip Exercises: Supine   Straight Leg Raises  1 set;Both;15 reps;Limitations    Straight Leg Raises Limitations  BUE pressdown    Other Supine Knee/Hip Exercises  attempted log roll to the Rt with heavy cuing for proper technique, pt unable to complete    Other Supine Knee/Hip Exercises  rolling with LE leading Lt and Rt x15 reps each (PT cuing to improve mechanics) supine to prone and prone to supine       Knee/Hip Exercises: Sidelying   Hip ABduction  Right;1 set;5 reps;Limitations    Hip ABduction Limitations  unable to complete without increase in low back pain and increased lumbar lordosis    Other Sidelying Knee/Hip Exercises  abdominal brace against wall x15 reps      Manual Therapy   Manual therapy comments  sidelying    Joint Mobilization  lumbar rotation mobilization , Grade III x2 bouts each direction             PT Education - 03/16/18 1145    Education Details  technique with therex; importance of completing HEP consistently    Person(s) Educated  Patient    Methods  Explanation    Comprehension  Verbalized understanding       PT Short Term Goals - 03/14/18 1003      PT SHORT TERM GOAL #1   Title  Pt will demo consistency and independece with her initial HEP to decrease pain and improve body mechanics with daily activity.     Time  2    Period  Weeks    Status  Achieved      PT SHORT TERM GOAL #2   Title  Pt will report atleast 25% improvement in her pain from the start of therapy.     Time  3    Period  Weeks    Status  New        PT Long Term Goals - 03/14/18 1004      PT LONG TERM GOAL #1   Title  Pt will demo improved BLE strength to 5/5 MMT which will increase her ability to use proper mechanics with lifting boxes at work.     Time  6    Period  Weeks    Status  On-going      PT LONG TERM GOAL #2   Title  Pt will report atleast 50% decrease in her pain from the start of therapy which will improve her quality of life.     Time  6     Period  Weeks    Status  On-going      PT LONG TERM GOAL #3   Title  Pt will be able to demonstrate proper body mechanics when lifting a 10# box from the floor, 3/5 trials, without cuing from the therapist.     Time  6    Period  Weeks    Status  On-going      PT LONG TERM GOAL #4   Title  Pt will demo improved active lumbar rotation to atleast 45 degrees without increase in low back pain, which will allow her to complete sorting activity at work.     Time  6    Period  Weeks    Status  On-going            Plan - 03/16/18 1146    Clinical Impression Statement  Today's session focused on therex to promote trunk strength and neuromuscular control. Pt was unable to complete rolling activity secondary to trunk weakness, hip restrictions and lumbar ROM limitations. Completed manual treatment to the lumbar spine to decrease pain and muscle guarding. Due to pt's difficulty maintaining trunk stability during LE strengthening exercise, introduced more abdominal exercise for home. She demonstrated understanding at this time.    Rehab Potential  Good    PT Frequency  2x / week    PT Duration  6 weeks    PT Treatment/Interventions  ADLs/Self Care Home Management;Electrical Stimulation;Cryotherapy;Traction;Ultrasound;Moist Heat;Iontophoresis 4mg /ml Dexamethasone;Functional mobility training;Therapeutic exercise;Therapeutic activities    PT Next Visit Plan  f/u on HEP; trunk strengthening; hip strengthening progression (seated and standing)    PT Home Exercise Plan  [id number]     Consulted and Agree with Plan of Care  Patient       Patient will benefit from skilled therapeutic intervention in order to improve the following deficits and impairments:  Decreased activity tolerance, Impaired flexibility, Obesity, Decreased strength, Decreased range of motion, Increased muscle spasms, Pain, Improper body mechanics  Visit Diagnosis: Acute right-sided low back pain, with sciatica presence  unspecified  Pain in right hip  Abnormal posture  Muscle spasm of back     Problem List Patient Active Problem List   Diagnosis Date Noted  . BMI 40.0-44.9, adult (HCC) 10/16/2015  . Patellofemoral arthritis of right knee 09/02/2015  . Sacroiliac dysfunction 08/14/2014  . Muscle pain, myofascial 08/14/2014  . Essential hypertension, benign 04/30/2014  . Menorrhagia 04/18/2014  . Dysmenorrhea 04/18/2014  . Migraine headache 07/10/2013  . Low back pain radiating to right leg 05/18/2012    12:25 PM,03/16/18 Donita Brooks PT, DPT [facility] [place] [facility]  [phone]  [facility] [address] [place], [place], [address] Phone: [phone]   Fax:  [phone]  Name: Maria Powers MRN: [id number] Date of Birth: Jul 28, 1976   *Addendum to include manual therapy portion of note.  12:25 PM,03/16/18 Donita Brooks PT, DPT [facility]  [phone]

## 2018-03-18 ENCOUNTER — Encounter: Payer: Self-pay | Admitting: Family Medicine

## 2018-03-19 NOTE — Progress Notes (Signed)
HPI:  Using dictation device. Unfortunately this device frequently misinterprets words/phrases.  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 Edward JollySilva per her report, refused here. Reports will schedule.  Also may need vaccine titers for another job, not sure what she needs today.  ROS: See pertinent positives and negatives per HPI.  Past Medical History:  Diagnosis Date  . Anxiety   . Bipolar 1 disorder (HCC)   . Chronic pain    back, hip - seeing PMR  . Depression   . Dysmenorrhea   . Hypertension   . Obesity     Past Surgical History:  Procedure Laterality Date  . KIDNEY STONE SURGERY  2007  . KNEE ARTHROSCOPY Right 2003  . KNEE ARTHROSCOPY Right 2007  . SHOULDER ARTHROSCOPY Right 2007  . WISDOM TOOTH EXTRACTION  Highschool    Family History  Problem Relation Age of Onset  . Addison's disease Mother   . Diabetes Mother   . Bipolar disorder Mother   . Fibroids Maternal Aunt     SOCIAL HX: see hpi   Current Outpatient Medications:  .  amLODipine (NORVASC) 5 MG tablet, TAKE 1 TABLET BY MOUTH EVERY DAY, Disp: 30 tablet, Rfl: 5 .  busPIRone (BUSPAR) 10 MG tablet, Take 20 mg by mouth 2 (two) times daily. , Disp: , Rfl:  .  cyclobenzaprine (FLEXERIL) 10 MG tablet, TAKE 1 TABLET BY MOUTH THREE TIMES DAILY, Disp: 270 tablet, Rfl: 0 .  doxepin (SINEQUAN) 10 MG capsule, , Disp: , Rfl: 0 .  FLUoxetine (PROZAC) 40 MG capsule, Take 40 mg by mouth daily., Disp: , Rfl:  .  gabapentin (NEURONTIN) 300 MG capsule, TAKE ONE CAPSULE BY MOUTH TWICE DAILY, Disp: 180 capsule, Rfl: 0 .  lamoTRIgine (LAMICTAL) 150 MG tablet, Take 150 mg by mouth. 2 tabs at bedtime, Disp: , Rfl:  .   levonorgestrel (MIRENA) 20 MCG/24HR IUD, 1 each by Intrauterine route once., Disp: , Rfl:  .  lithium carbonate 300 MG capsule, Take 300 mg by mouth 2 (two) times daily with a meal. , Disp: , Rfl: 1 .  rizatriptan (MAXALT-MLT) 5 MG disintegrating tablet, Take 1 tablet (5 mg total) by mouth as needed., Disp: 10 tablet, Rfl: 3 .  traMADol (ULTRAM) 50 MG tablet, Take 1 tablet (50 mg total) by mouth 4 (four) times daily., Disp: 120 tablet, Rfl: 1  EXAM:  Vitals:   03/21/18 0752  BP: 120/82  Pulse: 86  Temp: 98.4 F (36.9 C)    Body mass index is 39.19 kg/m.  GENERAL: vitals reviewed and listed above, alert, oriented, appears well hydrated and in no acute distress  HEENT: atraumatic, conjunttiva clear, no obvious abnormalities on inspection of external nose and ears  NECK: no obvious masses on inspection  LUNGS: clear to auscultation bilaterally, no wheezes, rales or rhonchi, good air movement  CV: HRRR, no peripheral edema  MS: moves all extremities without noticeable abnormality  PSYCH: pleasant and cooperative, no obvious depression or anxiety  ASSESSMENT AND PLAN:  Discussed the following assessment and plan:  Nausea and vomiting, intractability of vomiting not specified, unspecified vomiting type  -symptoms resolved completely for several days per pt -advised good hand hygeine -note provided -advised  to get pap - she wants to do with gyn discussed titers, she will find out what she needs and let us know -Patient advised to return or notify a doctor immediately if symptoms worsen or persist or new concerns arise.  Patient Instructions  Return to work note  Please find out what titers and other things you may need for work and let us know.     Maria Koyanagi, DO

## 2018-03-21 ENCOUNTER — Ambulatory Visit: Payer: BLUE CROSS/BLUE SHIELD | Admitting: Family Medicine

## 2018-03-21 ENCOUNTER — Ambulatory Visit: Payer: BLUE CROSS/BLUE SHIELD | Admitting: Physical Therapy

## 2018-03-21 ENCOUNTER — Encounter: Payer: Self-pay | Admitting: *Deleted

## 2018-03-21 ENCOUNTER — Encounter: Payer: Self-pay | Admitting: Family Medicine

## 2018-03-21 VITALS — BP 120/82 | HR 86 | Temp 98.4°F | Ht 65.0 in | Wt 235.5 lb

## 2018-03-21 DIAGNOSIS — R112 Nausea with vomiting, unspecified: Secondary | ICD-10-CM

## 2018-03-21 NOTE — Patient Instructions (Signed)
Return to work note  Please find out what titers and other things you may need for work and let us know.

## 2018-03-28 ENCOUNTER — Encounter: Payer: BLUE CROSS/BLUE SHIELD | Attending: Physical Medicine & Rehabilitation

## 2018-03-28 ENCOUNTER — Ambulatory Visit: Payer: BLUE CROSS/BLUE SHIELD | Admitting: Physical Medicine & Rehabilitation

## 2018-03-28 ENCOUNTER — Encounter: Payer: Self-pay | Admitting: Physical Medicine & Rehabilitation

## 2018-03-28 VITALS — BP 118/81 | HR 90 | Resp 14 | Ht 65.0 in | Wt 238.0 lb

## 2018-03-28 DIAGNOSIS — M545 Low back pain, unspecified: Secondary | ICD-10-CM

## 2018-03-28 DIAGNOSIS — M533 Sacrococcygeal disorders, not elsewhere classified: Secondary | ICD-10-CM | POA: Insufficient documentation

## 2018-03-28 DIAGNOSIS — G8929 Other chronic pain: Secondary | ICD-10-CM | POA: Diagnosis not present

## 2018-03-28 MED ORDER — CYCLOBENZAPRINE HCL 10 MG PO TABS: 10.0000 mg | ORAL_TABLET | Freq: Three times a day (TID) | ORAL | 1 refills | Status: DC

## 2018-03-28 MED ORDER — GABAPENTIN 300 MG PO CAPS: 300.0000 mg | ORAL_CAPSULE | Freq: Two times a day (BID) | ORAL | 1 refills | Status: DC

## 2018-03-28 MED ORDER — KETOROLAC TROMETHAMINE 60 MG/2ML IM SOLN
60.0000 mg | Freq: Once | INTRAMUSCULAR | Status: AC
  Administered 2018-03-28: 60 mg via INTRAMUSCULAR

## 2018-03-28 NOTE — Progress Notes (Signed)
Subjective:    Patient ID: Maria KeaAdrianne Powers, female    DOB: 10/20/1975, 42 y.o.   MRN: [id number]  HPI  42 year old female with history of sacroiliac disorder.  She has chronic low back pain that started in the peripartum timeframe many years ago patient had acute exacerbation of chronic low back pain Last visit.  Right hip and buttock pain but also pain radiating into the right lower extremity.  Last visit 6/10 Right Buttock and hip was 9/10, went through therapy now down to 4/10.  Completed 4/8 treatments, on hold due to URI.  Will resume in am  No new symptoms.      Pain Inventory Average Pain 4 Pain Right Now 3 My pain is intermittent, constant, sharp, burning, dull, stabbing and aching  In the last 24 hours, has pain interfered with the following? General activity 1 Relation with others 0 Enjoyment of life 0 What TIME of day is your pain at its worst? night Sleep (in general) Good  Pain is worse with: unsure Pain improves with: medication Relief from Meds: 7  Mobility walk without assistance Do you have any goals in this area?  no  Function Do you have any goals in this area?  no  Neuro/Psych No problems in this area  Prior Studies Any changes since last visit?  no  Physicians involved in your care Any changes since last visit?  no   Family History  Problem Relation Age of Onset  . Addison's disease Mother   . Diabetes Mother   . Bipolar disorder Mother   . Fibroids Maternal Aunt    Social History   Socioeconomic History  . Marital status: Single    Spouse name: Not on file  . Number of children: Not on file  . Years of education: Not on file  . Highest education level: Not on file  Occupational History  . Not on file  Social Needs  . Financial resource strain: Not on file  . Food insecurity:    Worry: Not on file    Inability: Not on file  . Transportation needs:    Medical: Not on file    Non-medical: Not on file  Tobacco Use  . Smoking  status: Former Games developermoker  . Smokeless tobacco: Never Used  Substance and Sexual Activity  . Alcohol use: No    Alcohol/week: 0.0 oz  . Drug use: No  . Sexual activity: Not Currently    Partners: Male    Birth control/protection: Abstinence, IUD    Comment: Mirena IUD inserted 06-21-15  Lifestyle  . Physical activity:    Days per week: Not on file    Minutes per session: Not on file  . Stress: Not on file  Relationships  . Social connections:    Talks on phone: Not on file    Gets together: Not on file    Attends religious service: Not on file    Active member of club or organization: Not on file    Attends meetings of clubs or organizations: Not on file    Relationship status: Not on file  Other Topics Concern  . Not on file  Social History Narrative   Work or School: Pharmacist, hospitalcustomers service dry cleaning      Home Situation: roommate      Spiritual Beliefs:mormon      Lifestyle: no regular exercise; horrible diet            Past Surgical History:  Procedure Laterality Date  .  KIDNEY STONE SURGERY  2007  . KNEE ARTHROSCOPY Right 2003  . KNEE ARTHROSCOPY Right 2007  . SHOULDER ARTHROSCOPY Right 2007  . WISDOM TOOTH EXTRACTION  Highschool   Past Medical History:  Diagnosis Date  . Anxiety   . Bipolar 1 disorder (HCC)   . Chronic pain    back, hip - seeing PMR  . Depression   . Dysmenorrhea   . Hypertension   . Obesity    BP 118/81 (BP Location: Right Arm, Patient Position: Sitting, Cuff Size: Large)   Pulse 90   Resp 14   Ht 5\' 5"  (1.651 m)   Wt 238 lb (108 kg)   SpO2 94%   BMI 39.61 kg/m   Opioid Risk Score:   Fall Risk Score:  `1  Depression screen PHQ 2/9  Depression screen [facility] 2/9 02/28/2018 11/15/2017 10/24/2015 06/18/2015 01/03/2015 02/27/2013  Decreased Interest 1 0 1 1 1  0  Down, Depressed, Hopeless 1 1 1 1 1  -  PHQ - 2 Score 2 1 2 2 2  0  Altered sleeping - - - - 3 -  Tired, decreased energy - - - - 2 -  Change in appetite - - - - 0 -  Feeling bad or  failure about yourself  - - - - 1 -  Trouble concentrating - - - - 1 -  Moving slowly or fidgety/restless - - - - 0 -  Suicidal thoughts - - - - 0 -  PHQ-9 Score - - - - 9 -    Review of Systems  Constitutional: Negative.   HENT: Negative.   Eyes: Negative.   Respiratory: Negative.   Cardiovascular: Negative.   Gastrointestinal: Negative.   Endocrine: Negative.   Genitourinary: Negative.   Musculoskeletal: Positive for arthralgias and back pain.  Skin: Negative.   Allergic/Immunologic: Negative.   Neurological: Negative.   Hematological: Negative.   Psychiatric/Behavioral: Negative.   All other systems reviewed and are negative.      Objective:   Physical Exam  Constitutional: She is oriented to person, place, and time. She appears well-developed and well-nourished. No distress.  HENT:  Head: Normocephalic and atraumatic.  Eyes: Pupils are equal, round, and reactive to light. EOM are normal.  Neck: Normal range of motion.  Musculoskeletal: Normal range of motion.  Neurological: She is alert and oriented to person, place, and time.  Skin: Skin is warm and dry. She is not diaphoretic.  Psychiatric: She has a normal mood and affect.  Nursing note and vitals reviewed.  75% flexion extension lateral bending and rotation. Negative straight leg raising Lower extremity strength is normal bilateral hip flexors knee extensor ankle dorsiflexor plantar flexor Sensation normal bilateral lower limbs Deep tendon reflex are normal bilateral lower limbs       Assessment & Plan:  1.  Acute exacerbation of chronic lumbar pain.  Overall improving.  No significant right lower extremity sciatic symptoms Toradol 60 mg IM today  In terms of injections would schedule L5 dorsal ramus injection S1-S2-S3 lateral branch blocks as a means of assessing potential efficacy of radiofrequency of the same nerves.  This may give a more prolonged effect i.e. 6 to 12 months

## 2018-03-29 ENCOUNTER — Telehealth: Payer: Self-pay | Admitting: Physical Medicine & Rehabilitation

## 2018-03-29 ENCOUNTER — Ambulatory Visit: Payer: BLUE CROSS/BLUE SHIELD | Attending: Physical Medicine & Rehabilitation | Admitting: Physical Therapy

## 2018-03-29 DIAGNOSIS — M545 Low back pain: Secondary | ICD-10-CM

## 2018-03-29 DIAGNOSIS — M62830 Muscle spasm of back: Secondary | ICD-10-CM | POA: Diagnosis present

## 2018-03-29 DIAGNOSIS — R293 Abnormal posture: Secondary | ICD-10-CM | POA: Insufficient documentation

## 2018-03-29 DIAGNOSIS — M25551 Pain in right hip: Secondary | ICD-10-CM | POA: Diagnosis present

## 2018-03-29 NOTE — Patient Instructions (Signed)

## 2018-03-29 NOTE — Telephone Encounter (Signed)
Patient calls and wants to do a "nerve block" - your note doesn't indicate procedure - what was discussed and do you need to schedule - she didn't book when she left the buidling.

## 2018-03-29 NOTE — Telephone Encounter (Signed)
Right L5 Dorsal Ramus  Right S1,2,3 Lateral branch block

## 2018-03-29 NOTE — Telephone Encounter (Signed)
Message sent to scheduling 

## 2018-03-29 NOTE — Therapy (Signed)
[facility] [address] [place], [place], [address] Phone: [phone]   Fax:  [phone]  Physical Therapy Treatment  Patient Details  Name: Maria Powers MRN: [id number] Date of Birth: 28-Jun-1976 Referring Provider: Loletta Parish, MD   Encounter Date: 03/29/2018  PT End of Session - 03/29/18 1017    Visit Number  5    Number of Visits  13    Date for PT Re-Evaluation  04/18/18    Authorization Type  BCBS    Authorization Time Period  03/07/18 to 04/18/18    PT Start Time  0932    PT Stop Time  1015    PT Time Calculation (min)  43 min    Activity Tolerance  Patient tolerated treatment well;No increased pain    Behavior During Therapy  WFL for tasks assessed/performed       Past Medical History:  Diagnosis Date  . Anxiety   . Bipolar 1 disorder (HCC)   . Chronic pain    back, hip - seeing PMR  . Depression   . Dysmenorrhea   . Hypertension   . Obesity     Past Surgical History:  Procedure Laterality Date  . KIDNEY STONE SURGERY  2007  . KNEE ARTHROSCOPY Right 2003  . KNEE ARTHROSCOPY Right 2007  . SHOULDER ARTHROSCOPY Right 2007  . WISDOM TOOTH EXTRACTION  Highschool    There were no vitals filed for this visit.  Subjective Assessment - 03/29/18 0936    Subjective  Pt reports that she was sick over the past week, which made her body ache really bad. She didn't do alot of moving around this past week and he back was really aggravated this morning when she got up and walked to the car. She has no pain currently.     Pertinent History  bipolar, depression, anxiety, Rt knee scope    Limitations  House hold activities;Lifting    Patient Stated Goals  decrease pain    Currently in Pain?  No/denies    Pain Onset  1 to 4 weeks ago                [facility] Adult PT Treatment/Exercise - 03/29/18 0001      Knee/Hip Exercises: Stretches   Piriformis Stretch  Right;2 reps;30 seconds      Knee/Hip Exercises: Prone   Other Prone Exercises  multifidi activation with heavy verbal and tactile cues for proper activation      Manual Therapy   Manual Therapy  Soft tissue mobilization;Passive ROM    Manual therapy comments  skilled palpation completed during trigger point dry needling    Soft tissue mobilization  STM the Rt lumbar parspinals, Rt gluteals     Passive ROM  Rt gluteal stretch x4 reps for 15 sec        Trigger Point Dry Needling - 03/29/18 1015    Consent Given?  Yes    Education Handout Provided  Yes    Muscles Treated Lower Body  Gluteus maximus Rt L4, L5 multifidi (+) twitch response noted    Gluteus Maximus Response  Twitch response elicited;Palpable increased muscle length           PT Education - 03/29/18 1017    Education Details  dry needling info    Person(s) Educated  Patient    Methods  Explanation;Handout;Verbal cues    Comprehension  Verbalized understanding;Returned demonstration       PT Short Term Goals -  03/29/18 1027      PT SHORT TERM GOAL #1   Title  Pt will demo consistency and independece with her initial HEP to decrease pain and improve body mechanics with daily activity.     Time  2    Period  Weeks    Status  Achieved      PT SHORT TERM GOAL #2   Title  Pt will report atleast 25% improvement in her pain from the start of therapy.     Time  3    Period  Weeks    Status  On-going        PT Long Term Goals - 03/14/18 1004      PT LONG TERM GOAL #1   Title  Pt will demo improved BLE strength to 5/5 MMT which will increase her ability to use proper mechanics with lifting boxes at work.     Time  6    Period  Weeks    Status  On-going      PT LONG TERM GOAL #2   Title  Pt will report atleast 50% decrease in her pain from the start of therapy which will improve her quality of life.     Time  6    Period  Weeks    Status  On-going      PT LONG TERM GOAL #3   Title  Pt will be able to demonstrate proper body mechanics  when lifting a 10# box from the floor, 3/5 trials, without cuing from the therapist.     Time  6    Period  Weeks    Status  On-going      PT LONG TERM GOAL #4   Title  Pt will demo improved active lumbar rotation to atleast 45 degrees without increase in low back pain, which will allow her to complete sorting activity at work.     Time  6    Period  Weeks    Status  On-going            Plan - 03/29/18 1018    Clinical Impression Statement  Pt arrived without pain, although she has noticed increased muscle spasm in the Rt low back/buttock region following a sickness and excessive vomiting this past weekend. Therapist discussed implications for dry needling and pt was agreeable to this. Completed soft tissue mobilization and trigger point dry needling to the lumbar paraspinals and multifidi on the Rt, with noted decrease in tenderness with palpation following this. Ended session without reports of pain. Will continue with current POC.     Rehab Potential  Good    PT Frequency  2x / week    PT Duration  6 weeks    PT Treatment/Interventions  ADLs/Self Care Home Management;Electrical Stimulation;Cryotherapy;Traction;Ultrasound;Moist Heat;Iontophoresis 4mg /ml Dexamethasone;Functional mobility training;Therapeutic exercise;Therapeutic activities    PT Next Visit Plan  f/u on HEP and dry needling; multifidi and trunk strengthening; hip strengthening progression (seated and standing)    PT Home Exercise Plan  [id number]     Consulted and Agree with Plan of Care  Patient       Patient will benefit from skilled therapeutic intervention in order to improve the following deficits and impairments:  Decreased activity tolerance, Impaired flexibility, Obesity, Decreased strength, Decreased range of motion, Increased muscle spasms, Pain, Improper body mechanics  Visit Diagnosis: Acute right-sided low back pain, with sciatica presence unspecified  Pain in right hip  Abnormal posture  Muscle  spasm of back  Problem List Patient Active Problem List   Diagnosis Date Noted  . BMI 40.0-44.9, adult (HCC) 10/16/2015  . Patellofemoral arthritis of right knee 09/02/2015  . Sacroiliac dysfunction 08/14/2014  . Muscle pain, myofascial 08/14/2014  . Essential hypertension, benign 04/30/2014  . Menorrhagia 04/18/2014  . Dysmenorrhea 04/18/2014  . Migraine headache 07/10/2013  . Low back pain radiating to right leg 05/18/2012    10:32 AM,03/29/18 Donita BrooksSara Romy Ipock PT, DPT [facility]  [phone]  [facility] [address] [place], [place], [address] Phone: [phone]   Fax:  [phone]  Name: Rosita Keadrianne Wulf MRN: [id number] Date of Birth: 06/28/1976

## 2018-03-31 ENCOUNTER — Ambulatory Visit: Payer: BLUE CROSS/BLUE SHIELD | Admitting: Physical Therapy

## 2018-03-31 ENCOUNTER — Encounter: Payer: Self-pay | Admitting: Physical Therapy

## 2018-03-31 DIAGNOSIS — M62830 Muscle spasm of back: Secondary | ICD-10-CM

## 2018-03-31 DIAGNOSIS — R293 Abnormal posture: Secondary | ICD-10-CM

## 2018-03-31 DIAGNOSIS — M545 Low back pain: Secondary | ICD-10-CM | POA: Diagnosis not present

## 2018-03-31 DIAGNOSIS — M25551 Pain in right hip: Secondary | ICD-10-CM

## 2018-03-31 NOTE — Therapy (Signed)
[facility] [address] [place], [place], [address] Phone: [phone]   Fax:  [phone]  Physical Therapy Treatment  Patient Details  Name: Maria Powers MRN: [id number] Date of Birth: [birth year] Referring Provider: Loletta Parish, MD   Encounter Date: 03/31/2018  PT End of Session - 03/31/18 1029    Visit Number  5    Number of Visits  13    Date for PT Re-Evaluation  04/18/18    Authorization Type  BCBS    Authorization Time Period  03/07/18 to 04/18/18    PT Start Time  0932    PT Stop Time  1015    PT Time Calculation (min)  43 min    Activity Tolerance  Patient tolerated treatment well;No increased pain    Behavior During Therapy  WFL for tasks assessed/performed       Past Medical History:  Diagnosis Date  . Anxiety   . Bipolar 1 disorder (HCC)   . Chronic pain    back, hip - seeing PMR  . Depression   . Dysmenorrhea   . Hypertension   . Obesity     Past Surgical History:  Procedure Laterality Date  . KIDNEY STONE SURGERY  2007  . KNEE ARTHROSCOPY Right 2003  . KNEE ARTHROSCOPY Right 2007  . SHOULDER ARTHROSCOPY Right 2007  . WISDOM TOOTH EXTRACTION  Highschool    There were no vitals filed for this visit.  Subjective Assessment - 03/31/18 0934    Subjective  Pt reports that things are going well. She was sore following the needling, but this is resolved right now. She has some soreness in her buttock area currently.     Pertinent History  bipolar, depression, anxiety, Rt knee scope    Limitations  House hold activities;Lifting    Patient Stated Goals  decrease pain    Currently in Pain?  Yes    Pain Score  2     Pain Location  Sacrum    Pain Orientation  Right    Pain Descriptors / Indicators  Aching;Tender    Pain Type  Acute pain    Pain Radiating Towards  none     Pain Onset  More than a month ago    Pain Frequency  Intermittent    Aggravating Factors   worse in the mornings      Pain Relieving Factors  injection helps                        [facility] Adult PT Treatment/Exercise - 03/31/18 0001      Knee/Hip Exercises: Supine   Other Supine Knee/Hip Exercises  abdominal bracing with BUE press and alternating LE march x10 reps;     Other Supine Knee/Hip Exercises  hooklying chops with green TB x15 reps each direction with adductor squeeze; single leg clam with Rt only x10 reps using green TB       Knee/Hip Exercises: Prone   Hip Extension  Both;1 set;10 reps      Manual Therapy   Soft tissue mobilization  STM Rt glute max and glute med, Rt piriformis with contract/relax TPR             PT Education - 03/31/18 1028    Education Details  expected soreness following dry needling; importance of core abdominal/hip strength in suporting the low back/pelvis    Person(s) Educated  Patient    Methods  Explanation  Comprehension  Verbalized understanding       PT Short Term Goals - 03/29/18 1027      PT SHORT TERM GOAL #1   Title  Pt will demo consistency and independece with her initial HEP to decrease pain and improve body mechanics with daily activity.     Time  2    Period  Weeks    Status  Achieved      PT SHORT TERM GOAL #2   Title  Pt will report atleast 25% improvement in her pain from the start of therapy.     Time  3    Period  Weeks    Status  On-going        PT Long Term Goals - 03/14/18 1004      PT LONG TERM GOAL #1   Title  Pt will demo improved BLE strength to 5/5 MMT which will increase her ability to use proper mechanics with lifting boxes at work.     Time  6    Period  Weeks    Status  On-going      PT LONG TERM GOAL #2   Title  Pt will report atleast 50% decrease in her pain from the start of therapy which will improve her quality of life.     Time  6    Period  Weeks    Status  On-going      PT LONG TERM GOAL #3   Title  Pt will be able to demonstrate proper body mechanics when lifting a 10# box from  the floor, 3/5 trials, without cuing from the therapist.     Time  6    Period  Weeks    Status  On-going      PT LONG TERM GOAL #4   Title  Pt will demo improved active lumbar rotation to atleast 45 degrees without increase in low back pain, which will allow her to complete sorting activity at work.     Time  6    Period  Weeks    Status  On-going            Plan - 03/31/18 1030    Clinical Impression Statement  Continued this session with therex to improve abdominal activation. Pt has significant difficulty properly activating her transverse abdominus and required max cuing with this. Completed manual techniques to decrease palpable trigger points and muscle spasm throughout the Rt gluteals. Therapist also made adjustments to pt's HEP and when she completes her exercises in order to decrease pain through the night. Will continue with current POC and consider dry needling treatment next session to further decrease muscle trigger points.     Rehab Potential  Good    PT Frequency  2x / week    PT Duration  6 weeks    PT Treatment/Interventions  ADLs/Self Care Home Management;Electrical Stimulation;Cryotherapy;Traction;Ultrasound;Moist Heat;Iontophoresis 4mg /ml Dexamethasone;Functional mobility training;Therapeutic exercise;Therapeutic activities    PT Next Visit Plan  DN glutes; multifidi and trunk strength; hip strength progression     PT Home Exercise Plan  [id number]     Consulted and Agree with Plan of Care  Patient       Patient will benefit from skilled therapeutic intervention in order to improve the following deficits and impairments:  Decreased activity tolerance, Impaired flexibility, Obesity, Decreased strength, Decreased range of motion, Increased muscle spasms, Pain, Improper body mechanics  Visit Diagnosis: Acute right-sided low back pain, with sciatica presence unspecified  Pain in right  hip  Abnormal posture  Muscle spasm of back     Problem List Patient  Active Problem List   Diagnosis Date Noted  . BMI 40.0-44.9, adult (HCC) 10/16/2015  . Patellofemoral arthritis of right knee 09/02/2015  . Sacroiliac dysfunction 08/14/2014  . Muscle pain, myofascial 08/14/2014  . Essential hypertension, benign 04/30/2014  . Menorrhagia 04/18/2014  . Dysmenorrhea 04/18/2014  . Migraine headache 07/10/2013  . Low back pain radiating to right leg 05/18/2012    11:49 AM,03/31/18 Donita BrooksSara Cordella Nyquist PT, DPT [facility]  [phone]  [facility] [address] [place], [place], [address] Phone: [phone]   Fax:  [phone]  Name: Maria Powers MRN: [id number] Date of Birth: 09/24/1975

## 2018-04-04 ENCOUNTER — Telehealth: Payer: Self-pay | Admitting: Physical Therapy

## 2018-04-04 ENCOUNTER — Ambulatory Visit: Payer: BLUE CROSS/BLUE SHIELD | Admitting: Physical Therapy

## 2018-04-04 NOTE — Telephone Encounter (Signed)
No show. PT called and left voicemail requesting pt to call back at her earliest convenience.    9:46 AM,04/04/18 Donita BrooksSara Vicci Reder PT, DPT [facility]  [phone]

## 2018-04-07 ENCOUNTER — Ambulatory Visit: Payer: BLUE CROSS/BLUE SHIELD | Admitting: Physical Therapy

## 2018-04-07 DIAGNOSIS — M62830 Muscle spasm of back: Secondary | ICD-10-CM

## 2018-04-07 DIAGNOSIS — M545 Low back pain: Secondary | ICD-10-CM | POA: Diagnosis not present

## 2018-04-07 DIAGNOSIS — M25551 Pain in right hip: Secondary | ICD-10-CM

## 2018-04-07 DIAGNOSIS — R293 Abnormal posture: Secondary | ICD-10-CM

## 2018-04-07 NOTE — Therapy (Addendum)
[facility] [address] [place], [place], [address] Phone: [phone]   Fax:  [phone]  Physical Therapy Treatment/Discharge   Patient Details  Name: Maria Powers MRN: [id number] Date of Birth: 02/18/1976 Referring Provider: Lynford Citizen, MD   Encounter Date: 04/07/2018  PT End of Session - 04/07/18 1019    Visit Number  6    Number of Visits  13    Date for PT Re-Evaluation  04/18/18    Authorization Type  BCBS    Authorization Time Period  03/07/18 to 04/18/18    PT Start Time  0931    PT Stop Time  1014    PT Time Calculation (min)  43 min    Activity Tolerance  Patient tolerated treatment well;No increased pain    Behavior During Therapy  WFL for tasks assessed/performed       Past Medical History:  Diagnosis Date  . Anxiety   . Bipolar 1 disorder ([place])   . Chronic pain    back, hip - seeing PMR  . Depression   . Dysmenorrhea   . Hypertension   . Obesity     Past Surgical History:  Procedure Laterality Date  . KIDNEY STONE SURGERY  2007  . KNEE ARTHROSCOPY Right 2003  . KNEE ARTHROSCOPY Right 2007  . SHOULDER ARTHROSCOPY Right 2007  . WISDOM TOOTH EXTRACTION  Highschool    There were no vitals filed for this visit.  Subjective Assessment - 04/07/18 0936    Subjective  Pt reports that things are going well. She thinks she is about 42% improved since beginning PT. She will complete her HEP, but does not do her strengthening exercises. No pain currently.     Pertinent History  bipolar, depression, anxiety, Rt knee scope    Limitations  House hold activities;Lifting    Patient Stated Goals  decrease pain    Currently in Pain?  No/denies    Pain Onset  More than a month ago         [facility] PT Assessment - 04/07/18 0001      Strength   Right Hip Flexion  5/5    Right Hip Extension  5/5    Right Hip ABduction  5/5    Left Hip Flexion  5/5    Left Hip Extension  5/5    Left Hip  ABduction  5/5    Right Knee Flexion  5/5    Right Knee Extension  5/5    Left Knee Flexion  5/5    Left Knee Extension  5/5                   [facility] Adult PT Treatment/Exercise - 04/07/18 0001      Knee/Hip Exercises: Stretches   Piriformis Stretch  Right;1 rep;30 seconds    Piriformis Stretch Limitations  seated       Knee/Hip Exercises: Seated   Other Seated Knee/Hip Exercises  seated hip IR and ER with yellow TB x10 reps each       Knee/Hip Exercises: Supine   Other Supine Knee/Hip Exercises  abdominal brace with single leg clam, blue TB x20 reps each       Manual Therapy   Soft tissue mobilization  STM Rt gluteals and Rt piriformis TPR        Trigger Point Dry Needling - 04/07/18 1012    Consent Given?  Yes    Muscles Treated Lower Body  Gluteus maximus;Piriformis Rt  Gluteus Maximus Response  Twitch response elicited;Palpable increased muscle length    Piriformis Response  Twitch response elicited;Palpable increased muscle length           PT Education - 04/07/18 1018    Education Details  pain science; importance of completing HEP    Person(s) Educated  Patient    Methods  Explanation    Comprehension  Verbalized understanding       PT Short Term Goals - 04/07/18 0938      PT SHORT TERM GOAL #1   Title  Pt will demo consistency and independece with her initial HEP to decrease pain and improve body mechanics with daily activity.     Baseline  doing part of the exercises but not all of them.     Time  2    Period  Weeks    Status  Partially Met      PT SHORT TERM GOAL #2   Title  Pt will report atleast 25% improvement in her pain from the start of therapy.     Baseline  42%    Time  3    Period  Weeks    Status  Achieved        PT Long Term Goals - 04/07/18 0948      PT LONG TERM GOAL #1   Title  Pt will demo improved BLE strength to 5/5 MMT which will increase her ability to use proper mechanics with lifting boxes at work.      Baseline  5/5 MMT except hip rotation 4/5 MMT    Time  6    Period  Weeks    Status  Partially Met      PT LONG TERM GOAL #2   Title  Pt will report atleast 50% decrease in her pain from the start of therapy which will improve her quality of life.     Baseline  42% improvement    Time  6    Period  Weeks    Status  Partially Met      PT LONG TERM GOAL #3   Title  Pt will be able to demonstrate proper body mechanics when lifting a 10# box from the floor, 3/5 trials, without cuing from the therapist.     Time  6    Period  Weeks    Status  On-going      PT LONG TERM GOAL #4   Title  Pt will demo improved active lumbar rotation to atleast 45 degrees without increase in low back pain, which will allow her to complete sorting activity at work.     Time  6    Period  Weeks    Status  On-going            Plan - 04/07/18 1019    Clinical Impression Statement  Pt is making steady progress towards her goals since beginning PT. She feels overall 42% improved, noting less pain throughout her work day. Her strength has increased to 5/5 MMT except for rotational strength of the Lt and Rt hip. Session focused on encouraging full HEP adherence to improve hip rotation strength and abdominal activation and therapist completed soft tissue mobilization with dry needling to further decrease muscle spasm of the Rt glutes and piriformis. Ended session without pain. Pt will continue to benefit from skilled PT to address remaining limitation and progress towards her remaining long term goals.     Rehab Potential  Good  PT Frequency  2x / week    PT Duration  6 weeks    PT Treatment/Interventions  ADLs/Self Care Home Management;Electrical Stimulation;Cryotherapy;Traction;Ultrasound;Moist Heat;Iontophoresis 45m/ml Dexamethasone;Functional mobility training;Therapeutic exercise;Therapeutic activities    PT Next Visit Plan  f/u on DN; hip rotation strength; standing hip ext/abd; multifidi and trunk  strength    PT Home Exercise Plan  [id number]     Consulted and Agree with Plan of Care  Patient       Patient will benefit from skilled therapeutic intervention in order to improve the following deficits and impairments:  Decreased activity tolerance, Impaired flexibility, Obesity, Decreased strength, Decreased range of motion, Increased muscle spasms, Pain, Improper body mechanics  Visit Diagnosis: Acute right-sided low back pain, with sciatica presence unspecified  Pain in right hip  Abnormal posture  Muscle spasm of back     Problem List Patient Active Problem List   Diagnosis Date Noted  . BMI 40.0-44.9, adult ([place] 10/16/2015  . Patellofemoral arthritis of right knee 09/02/2015  . Sacroiliac dysfunction 08/14/2014  . Muscle pain, myofascial 08/14/2014  . Essential hypertension, benign 04/30/2014  . Menorrhagia 04/18/2014  . Dysmenorrhea 04/18/2014  . Migraine headache 07/10/2013  . Low back pain radiating to right leg 05/18/2012   10:26 AM,04/07/18 SSherol DadePT, DPT [facility] [address] [place] [address]Phone: [phone]  Fax:  [phone] Name: Maria EnrightMRN: [id number] of Birth: 516-Feb-1977 *addendum to resolve episode of care and d/c pt from PPablo Pena Visits from Start of Care: 6  Current functional level related to goals / functional outcomes: See above for more details    Remaining deficits: See above for more details    Education / Equipment: See above for more details   Plan: Patient agrees to discharge.  Patient goals were partially met. Patient is being discharged due to not returning since the last visit.  ?????    8:21 AM,06/09/18 STurner DLoma [place]at [place]

## 2018-05-05 ENCOUNTER — Encounter: Payer: BLUE CROSS/BLUE SHIELD | Attending: Physical Medicine & Rehabilitation

## 2018-05-05 ENCOUNTER — Encounter: Payer: Self-pay | Admitting: Physical Medicine & Rehabilitation

## 2018-05-05 ENCOUNTER — Ambulatory Visit: Payer: BLUE CROSS/BLUE SHIELD | Admitting: Physical Medicine & Rehabilitation

## 2018-05-05 VITALS — BP 134/91 | HR 83 | Ht 65.0 in | Wt 242.0 lb

## 2018-05-05 DIAGNOSIS — Z79899 Other long term (current) drug therapy: Secondary | ICD-10-CM

## 2018-05-05 DIAGNOSIS — M533 Sacrococcygeal disorders, not elsewhere classified: Secondary | ICD-10-CM | POA: Diagnosis not present

## 2018-05-05 DIAGNOSIS — Z5181 Encounter for therapeutic drug level monitoring: Secondary | ICD-10-CM | POA: Diagnosis not present

## 2018-05-05 DIAGNOSIS — Z79891 Long term (current) use of opiate analgesic: Secondary | ICD-10-CM

## 2018-05-05 MED ORDER — TRAMADOL HCL 50 MG PO TABS: 50.0000 mg | ORAL_TABLET | Freq: Four times a day (QID) | ORAL | 5 refills | Status: DC

## 2018-05-05 NOTE — Patient Instructions (Signed)
Lumbar medial and sacral lateral  branch blocks were performed. This is to help diagnose the cause of the low back pain. It is important that you keep track of your pain for the first day or 2 after injection. This injection can give you temporary relief that lasts for hours or up to several months. There is no way to predict duration of pain relief.  Please try to compare your pain after injection to for the injection.  If this injection gives you  temporary relief there may be another longer-lasting procedure that may be beneficial call radiofrequency ablation

## 2018-05-05 NOTE — Progress Notes (Signed)
  PROCEDURE RECORD [facility]   Name: Maria Powers DOB:04/29/1976 MRN: [id number]  Date:05/05/2018  Physician: Claudette LawsAndrew Kirsteins, MD    Nurse/CMA: Cung Masterson CMA  Allergies:  Allergies  Allergen Reactions  . Clindamycin/Lincomycin Nausea And Vomiting  . Nubain [Nalbuphine Hcl] Other (See Comments)    *feels like something crawling on her*    Consent Signed: Yes.    Is patient diabetic? No.  CBG today? NA  Pregnant: No. LMP: No LMP recorded. (Menstrual status: IUD). (age 218-55)  Anticoagulants: no Anti-inflammatory: no Antibiotics: no  Procedure: Right L5, S1-3 Lateral Branch Blocks  Position: Prone   Start Time: 10:12am End Time: 1019am Fluoro Time: 35s  RN/CMA Bandy Honaker CMA Benicia Bergevin CMA    Time 9:51am 10:24am    BP 134/91 135/88    Pulse 83 82    Respirations 16 16    O2 Sat 97 97    S/S 6 6    Pain Level 5/10 2/10     D/C home with [name] roommate, patient A & O X 3, D/C instructions reviewed, and sits independently.

## 2018-05-05 NOTE — Progress Notes (Signed)
Will repeat in one month if pt has recurrance of symptoms

## 2018-05-05 NOTE — Progress Notes (Signed)
L5 dorsal ramus S1-S2-S3 lateral branch blocks under fluoroscopic guidance Right  side  Informed consent was obtained after describing risks and benefits of the procedure with patient these include bleeding bruising and infection.  He elects to proceed and has given written consent.  Patient placed prone on fluoroscopy table Betadine prep sterile drape a 25-gauge 1.5 inch needle was used to anesthetize skin and subcu tissue with 1% lidocaine 1 cc into each of 4 sites.  Then a 22-gauge 5" needle was inserted under fluoroscopic guidance for starting the S1 SAP sacral ala junction.  Bone contact made.  Isovue 200 x 0.5 mL demonstrated no intravascular uptake then 0.5 mL of 2% lidocaine was injected.  Then the lateral aspect of the S1, S2, S3 foramen was targeted.  Bone contact made out, Isovue-200 times 0.5 mL demonstrated no nerve root or intravascular uptake within 0.5 mL of 2% lidocaine solution was injected after negative drawback for blood.  Patient tolerated procedure well.  Postinjection instructions given. 

## 2018-05-08 LAB — DRUG TOX MONITOR 1 W/CONF, ORAL FLD
Amphetamines: NEGATIVE ng/mL (ref <10)
BARBITURATES: NEGATIVE ng/mL (ref <10)
BENZODIAZEPINES: NEGATIVE ng/mL (ref <0.50)
BUPRENORPHINE: NEGATIVE ng/mL (ref <0.10)
COCAINE: NEGATIVE ng/mL (ref <5.0)
FENTANYL: NEGATIVE ng/mL (ref <0.10)
Heroin Metabolite: NEGATIVE ng/mL (ref <1.0)
MARIJUANA: NEGATIVE ng/mL (ref <2.5)
MDMA: NEGATIVE ng/mL (ref <10)
MEPROBAMATE: NEGATIVE ng/mL (ref <2.5)
METHADONE: NEGATIVE ng/mL (ref <5.0)
NICOTINE METABOLITE: NEGATIVE ng/mL (ref <5.0)
Opiates: NEGATIVE ng/mL (ref <2.5)
Phencyclidine: NEGATIVE ng/mL (ref <10)
TAPENTADOL: NEGATIVE ng/mL (ref <5.0)
Tramadol: 433.1 ng/mL — ABNORMAL HIGH (ref <5.0)
Tramadol: POSITIVE ng/mL — AB (ref <5.0)
Zolpidem: NEGATIVE ng/mL (ref <5.0)

## 2018-05-08 LAB — DRUG TOX ALC METAB W/CON, ORAL FLD: Alcohol Metabolite: NEGATIVE ng/mL (ref <25)

## 2018-05-15 ENCOUNTER — Other Ambulatory Visit: Payer: Self-pay | Admitting: Family Medicine

## 2018-06-16 ENCOUNTER — Encounter: Payer: BLUE CROSS/BLUE SHIELD | Attending: Physical Medicine & Rehabilitation

## 2018-06-16 ENCOUNTER — Ambulatory Visit (HOSPITAL_BASED_OUTPATIENT_CLINIC_OR_DEPARTMENT_OTHER): Payer: BLUE CROSS/BLUE SHIELD | Admitting: Physical Medicine & Rehabilitation

## 2018-06-16 ENCOUNTER — Encounter: Payer: Self-pay | Admitting: Physical Medicine & Rehabilitation

## 2018-06-16 VITALS — BP 145/93 | HR 91 | Resp 14 | Ht 65.0 in | Wt 236.0 lb

## 2018-06-16 DIAGNOSIS — M533 Sacrococcygeal disorders, not elsewhere classified: Secondary | ICD-10-CM | POA: Diagnosis present

## 2018-06-16 DIAGNOSIS — G8929 Other chronic pain: Secondary | ICD-10-CM | POA: Diagnosis not present

## 2018-06-16 NOTE — Progress Notes (Signed)
L5 dorsal ramus S1-S2-S3 lateral branch blocks under fluoroscopic guidance Right  side  Informed consent was obtained after describing risks and benefits of the procedure with patient these include bleeding bruising and infection.  He elects to proceed and has given written consent.  Patient placed prone on fluoroscopy table Betadine prep sterile drape a 25-gauge 1.5 inch needle was used to anesthetize skin and subcu tissue with 1% lidocaine 1 cc into each of 4 sites.  Then a 22-gauge 5" needle was inserted under fluoroscopic guidance for starting the S1 SAP sacral ala junction.  Bone contact made.  Isovue 200 x 0.5 mL demonstrated no intravascular uptake then 0.5 mL of 2% lidocaine was injected.  Then the lateral aspect of the S1, S2, S3 foramen was targeted.  Bone contact made out, Isovue-200 times 0.5 mL demonstrated no nerve root or intravascular uptake within 0.5 mL of 2% lidocaine solution was injected after negative drawback for blood.  Patient tolerated procedure well.  Postinjection instructions given. 

## 2018-06-16 NOTE — Patient Instructions (Signed)
If this injection results in at least 50% temporary relief of low back and buttocks pain , would rec radiofreq  Procedure

## 2018-06-16 NOTE — Progress Notes (Signed)
  PROCEDURE RECORD [facility]   Name: Maria Powers DOB:October 18, 1975 MRN: [id number]  Date:06/16/2018  Physician: Claudette Laws, MD    Nurse/CMA: Bright, CMA   Allergies:  Allergies  Allergen Reactions  . Clindamycin/Lincomycin Nausea And Vomiting  . Nubain [Nalbuphine Hcl] Other (See Comments)    *feels like something crawling on her*    Consent Signed: Yes.    Is patient diabetic? No.  CBG today? NA  Pregnant: No. LMP: No LMP recorded. (Menstrual status: IUD). (age 59-55)  Anticoagulants: no Anti-inflammatory: no Antibiotics: no  Procedure: Right L5 dorsal ramus, S1-3 lateral branch blocks  Position: Prone   Start Time: 1129am  End Time: 1140am Fluoro Time: 39s  RN/CMA Emmary Culbreath, CMA Bright, CMA    Time 10:45am 1141am    BP 145/93 140/89    Pulse 91 80    Respirations 14 14    O2 Sat 95 97    S/S 6 6    Pain Level 3/10 1/10     D/C home with Harriett Sine (roommate), patient A & O X 3, D/C instructions reviewed, and sits independently.

## 2018-07-04 ENCOUNTER — Other Ambulatory Visit: Payer: Self-pay | Admitting: Family Medicine

## 2018-07-04 NOTE — Telephone Encounter (Signed)
Copied from CRM [id number]. Topic: Quick Communication - Rx Refill/Question >> Jul 04, 2018  3:32 PM Arlyss Gandy, NT wrote: Medication: rizatriptan (MAXALT-MLT) 5 MG disintegrating tablet   Has the patient contacted their pharmacy? Yes.   (Agent: If no, request that the patient contact the pharmacy for the refill.) (Agent: If yes, when and what did the pharmacy advise?)  Preferred Pharmacy (with phone number or street name): [id number] [place] Otto, [place] - [phone] AT [facility] [address] [address] [phone] (Phone) [phone] (Fax)    Agent: Please be advised that RX refills may take up to 3 business days. We ask that you follow-up with your pharmacy.

## 2018-07-05 MED ORDER — RIZATRIPTAN BENZOATE 5 MG PO TBDP: 5.0000 mg | ORAL_TABLET | ORAL | 0 refills | Status: DC | PRN

## 2018-07-05 NOTE — Telephone Encounter (Signed)
Requested medication (s) are due for refill today: yes Requested medication (s) are on the active medication list: yes  Last refill:  07/24/15  Future visit scheduled: no  Notes to clinic:  BP 120/82 at office visit 03/21/18    Requested Prescriptions  Pending Prescriptions Disp Refills   rizatriptan (MAXALT-MLT) 5 MG disintegrating tablet 10 tablet 3    Sig: Take 1 tablet (5 mg total) by mouth as needed.     Neurology:  Migraine Therapy - Triptan Failed - 07/04/2018  3:48 PM      Failed - Last BP in normal range    BP Readings from Last 1 Encounters:  06/16/18 (!) 145/93         Passed - Valid encounter within last 12 months    Recent Outpatient Visits          3 months ago Nausea and vomiting, intractability of vomiting not specified, unspecified vomiting type   [facility] officer at [employer], [place] R, DO   7 months ago Encounter for preventive health examination   [facility] conservation officer at [employer], [place] R, DO   9 months ago Acute left-sided thoracic back pain   [facility] conservation officer at [employer], [place] R, DO   1 year ago BRBPR (bright red blood per rectum)   Nature conservation officer at [employer], [place] Lack, DO   1 year ago Essential hypertension   Nature conservation officer at [employer], [place] R, DO

## 2018-07-28 ENCOUNTER — Encounter: Payer: Self-pay | Admitting: Physical Medicine & Rehabilitation

## 2018-07-28 ENCOUNTER — Other Ambulatory Visit: Payer: Self-pay

## 2018-07-28 ENCOUNTER — Encounter: Payer: BLUE CROSS/BLUE SHIELD | Attending: Physical Medicine & Rehabilitation

## 2018-07-28 ENCOUNTER — Ambulatory Visit: Payer: BLUE CROSS/BLUE SHIELD | Admitting: Physical Medicine & Rehabilitation

## 2018-07-28 VITALS — BP 141/90 | HR 91 | Ht 65.0 in | Wt 241.4 lb

## 2018-07-28 DIAGNOSIS — M533 Sacrococcygeal disorders, not elsewhere classified: Secondary | ICD-10-CM

## 2018-07-28 NOTE — Progress Notes (Signed)
  PROCEDURE RECORD [facility]   Name: Maria Powers DOB:1975/11/22 MRN: [id number]  Date:07/28/2018  Physician: Claudette Laws, MD    Nurse/CMA: Nedra Hai CMA  Allergies:  Allergies  Allergen Reactions  . Clindamycin/Lincomycin Nausea And Vomiting  . Nubain [Nalbuphine Hcl] Other (See Comments)    *feels like something crawling on her*    Consent Signed: Yes.    Is patient diabetic? No.  CBG today?   Pregnant: No. LMP: No LMP recorded. (Menstrual status: IUD). (age 72-55)  Anticoagulants: no Anti-inflammatory: no Antibiotics: no  Procedure: Right lumbar level 5 sacral leve 1-3 Position: Prone Start Time:12:26pm End Time: 12:55pm Fluoro Time:45  RN/CMA Shumaker RN Nedra Hai CMA    Time 11:25 12:59    BP 141/90 140/93    Pulse 91 84    Respirations 14 16    O2 Sat 97 96    S/S 6 6    Pain Level 2/10 3/10     D/C home with Harriett Sine, patient A & O X 3, D/C instructions reviewed, and sits independently.

## 2018-07-28 NOTE — Patient Instructions (Signed)

## 2018-07-28 NOTE — Progress Notes (Signed)
Right Sacroiliac radio frequency ablation under fluoroscopic guidance This consists of L5 dorsal ramus radio frequency ablation plus RFA of S1-S2 -S3 dorsal rami  Indication is sacroiliac pain which has improved temporarily on 2 or more occasions by at least 50% following sacroiliac intra-articular injection under fluoroscopic guidance. Pain interferes with self-care and mobility and has failed to respond to conservative measures.  Informed consent was obtained after discussing risks and benefits of the procedure with the patient these include bleeding bruising and infection temporary or permanent paralysis. The patient elects to proceed and has given written consent.  Patient placed prone on fluoroscopy table. Area marked and prepped with Betadine. Fluoroscopic images utilized to guide needle. 25-gauge 1.5 inch needle was used to anesthetize 4 injection points with 2 cc of 1% lidocaine each. Then a 18-gauge 10 cm RF needle with a 10 mm curved active tip was inserted under fluoroscopic guidance targeting the S1 SA P./sacral ala junction, bone contact made and confirmed with lateral imaging.  motor stimulation at 2 Hz confirmed proper needle location followed by injection of one cc of a solution  1% lidocaine MPF. Then the inferolateral aspect of the S1, S2 and lateral aspect of S3 sacral foramina were targeted. Bone contact made. motor stim at 2 Hz confirm proper needle location. One ML of the 1% MPF lidocaine solution was injected into each of 3 sites and radio frequency ablation 80C for 90 seconds was performed. Patient tolerated procedure well. Post procedure instructions given

## 2018-08-06 ENCOUNTER — Other Ambulatory Visit: Payer: Self-pay | Admitting: Physical Medicine & Rehabilitation

## 2018-09-19 ENCOUNTER — Other Ambulatory Visit: Payer: Self-pay | Admitting: Physical Medicine & Rehabilitation

## 2018-11-06 ENCOUNTER — Other Ambulatory Visit: Payer: Self-pay | Admitting: Physical Medicine & Rehabilitation

## 2018-11-11 ENCOUNTER — Ambulatory Visit: Payer: PRIVATE HEALTH INSURANCE | Admitting: Physical Medicine & Rehabilitation

## 2018-11-20 ENCOUNTER — Other Ambulatory Visit: Payer: Self-pay | Admitting: Family Medicine

## 2018-11-21 ENCOUNTER — Encounter: Payer: PRIVATE HEALTH INSURANCE | Attending: Physical Medicine & Rehabilitation

## 2018-11-21 ENCOUNTER — Encounter: Payer: Self-pay | Admitting: Physical Medicine & Rehabilitation

## 2018-11-21 ENCOUNTER — Ambulatory Visit: Payer: PRIVATE HEALTH INSURANCE | Admitting: Physical Medicine & Rehabilitation

## 2018-11-21 ENCOUNTER — Other Ambulatory Visit: Payer: Self-pay

## 2018-11-21 VITALS — BP 135/90 | HR 96 | Ht 65.0 in | Wt 243.0 lb

## 2018-11-21 DIAGNOSIS — G8929 Other chronic pain: Secondary | ICD-10-CM | POA: Diagnosis not present

## 2018-11-21 DIAGNOSIS — Z79891 Long term (current) use of opiate analgesic: Secondary | ICD-10-CM

## 2018-11-21 DIAGNOSIS — M545 Low back pain: Secondary | ICD-10-CM | POA: Diagnosis not present

## 2018-11-21 DIAGNOSIS — M533 Sacrococcygeal disorders, not elsewhere classified: Secondary | ICD-10-CM | POA: Insufficient documentation

## 2018-11-21 DIAGNOSIS — G894 Chronic pain syndrome: Secondary | ICD-10-CM | POA: Diagnosis not present

## 2018-11-21 DIAGNOSIS — Z5181 Encounter for therapeutic drug level monitoring: Secondary | ICD-10-CM | POA: Diagnosis not present

## 2018-11-21 MED ORDER — TRAMADOL HCL 50 MG PO TABS: ORAL_TABLET | ORAL | 4 refills | Status: DC

## 2018-11-21 NOTE — Progress Notes (Signed)
Subjective:    Patient ID: Maria Powers, female    DOB: 02/05/1976, 43 y.o.   MRN: [id number]  HPI Right L5, S1,2,3 RFA Nov 2019 working well 43 year old female with history of chronic right sacroiliac pain that had short-term relief with intra-articular sacroiliac injections underwent right L5 S1-S2-S3 lateral branch blocks on 2 occasions and had 50% temporary relief.  She underwent a right L5 S1-S2-S3 RFA in November 2019 and has had continued improvement in the right low back and buttock pain.  She does note some pain on the left side but this is not severe. She has not tried decreasing any of her medications and we discussed this today She is on tramadol 50 mg 4 times per day.  She feels that if she misses a dose she has increased pain The patient takes cyclobenzaprine 10 mg 3 times daily she has never tried missing a dose and is not sure what the effect is The patient is on gabapentin 300 mg twice daily and has never tried holding the dose. Pain Inventory Average Pain 2 Pain Right Now 1 My pain is sharp, dull, stabbing and aching  In the last 24 hours, has pain interfered with the following? General activity 0 Relation with others 0 Enjoyment of life 0 What TIME of day is your pain at its worst? evening Sleep (in general) Fair  Pain is worse with: unsure Pain improves with: rest and medication Relief from Meds: 8  Mobility Do you have any goals in this area?  no  Function Do you have any goals in this area?  no  Neuro/Psych No problems in this area  Prior Studies Any changes since last visit?  no  Physicians involved in your care Any changes since last visit?  no   Family History  Problem Relation Age of Onset  . Addison's disease Mother   . Diabetes Mother   . Bipolar disorder Mother   . Fibroids Maternal Aunt    Social History   Socioeconomic History  . Marital status: Single    Spouse name: Not on file  . Number of children: Not on file  . Years of  education: Not on file  . Highest education level: Not on file  Occupational History  . Not on file  Social Needs  . Financial resource strain: Not on file  . Food insecurity:    Worry: Not on file    Inability: Not on file  . Transportation needs:    Medical: Not on file    Non-medical: Not on file  Tobacco Use  . Smoking status: Former Games developer  . Smokeless tobacco: Never Used  Substance and Sexual Activity  . Alcohol use: No    Alcohol/week: 0.0 standard drinks  . Drug use: No  . Sexual activity: Not Currently    Partners: Male    Birth control/protection: Abstinence, I.U.D.    Comment: Mirena IUD inserted 06-21-15  Lifestyle  . Physical activity:    Days per week: Not on file    Minutes per session: Not on file  . Stress: Not on file  Relationships  . Social connections:    Talks on phone: Not on file    Gets together: Not on file    Attends religious service: Not on file    Active member of club or organization: Not on file    Attends meetings of clubs or organizations: Not on file    Relationship status: Not on file  Other Topics Concern  .  Not on file  Social History Narrative   Work or School: Pharmacist, hospital      Home Situation: roommate      Spiritual Beliefs:mormon      Lifestyle: no regular exercise; horrible diet            Past Surgical History:  Procedure Laterality Date  . KIDNEY STONE SURGERY  2007  . KNEE ARTHROSCOPY Right 2003  . KNEE ARTHROSCOPY Right 2007  . SHOULDER ARTHROSCOPY Right 2007  . WISDOM TOOTH EXTRACTION  Highschool   Past Medical History:  Diagnosis Date  . Anxiety   . Bipolar 1 disorder (HCC)   . Chronic pain    back, hip - seeing PMR  . Depression   . Dysmenorrhea   . Hypertension   . Obesity    BP 135/90   Pulse 96   Ht  (1.651 m)   Wt 243 lb (110.2 kg)   SpO2 96%   BMI 40.44 kg/m   Opioid Risk Score:   Fall Risk Score:  `1  Depression screen PHQ 2/9  Depression screen [facility] 2/9  11/21/2018 07/28/2018 02/28/2018 11/15/2017 10/24/2015 06/18/2015 01/03/2015  Decreased Interest 0 Down, Depressed, Hopeless PHQ - 2 Score Altered sleeping - - - - - - 3  Tired, decreased energy - - - - - - 2  Change in appetite - - - - - - 0  Feeling bad or failure about yourself  - - - - - - 1  Trouble concentrating - - - - - - 1  Moving slowly or fidgety/restless - - - - - - 0  Suicidal thoughts - - - - - - 0  PHQ-9 Score - - - - - - 9   Review of Systems  Constitutional: Negative.   HENT: Negative.   Eyes: Negative.   Respiratory: Negative.   Cardiovascular: Negative.   Gastrointestinal: Negative.   Endocrine: Negative.   Genitourinary: Negative.   Musculoskeletal: Negative.   Skin: Negative.   Allergic/Immunologic: Negative.   Neurological: Negative.   Hematological: Negative.   Psychiatric/Behavioral: Negative.   All other systems reviewed and are negative.      Objective:   Physical Exam Vitals signs and nursing note reviewed.  HENT:     Head: Normocephalic and atraumatic.     Mouth/Throat:     Mouth: Mucous membranes are moist.     Pharynx: Oropharynx is clear.  Eyes:     Extraocular Movements: Extraocular movements intact.     Conjunctiva/sclera: Conjunctivae normal.     Pupils: Pupils are equal, round, and reactive to light.  Neurological:     Mental Status: She is alert and oriented to person, place, and time. Mental status is at baseline.  Psychiatric:        Mood and Affect: Mood normal.        Behavior: Behavior normal.     Overweight female no acute distress Mood and affect are appropriate Ambulates without assistive device no evidence of toe drag or knee instability She has minimal tenderness at bilateral PSIS as well as the lumbosacral junction. She has good lumbar flexion moderately limited lumbar extension as well as lumbar rotation.       Assessment & Plan:  #1.  Chronic right sacroiliac pain overall  good response to sacroiliac radiofrequency neurotomy.  She is approximately 4 months  post.  She has milder symptoms in the left sacroiliac but not enough to consider any type of interventional procedure. We focused today on her medication management.  Will ask her to hold the gabapentin and if there is no increase in pain this can be discontinued.  We may consider in the future to hold the Flexeril as well and see if this can be discontinued or at least the dosage adjusted. If the patient's pain recurs prior to the scheduled follow-up in 6 months we can potentially repeat the radiofrequency ablation but not before May 2020. Repeat urine toxicology today

## 2018-11-21 NOTE — Patient Instructions (Signed)
Try stopping gabapentin and monitor pain levels, may restart if pain increased

## 2018-11-25 LAB — TOXASSURE SELECT,+ANTIDEPR,UR

## 2018-11-28 ENCOUNTER — Telehealth: Payer: Self-pay | Admitting: *Deleted

## 2018-11-28 NOTE — Telephone Encounter (Signed)
Urine drug screen for this encounter is consistent for prescribed medication 

## 2018-12-14 ENCOUNTER — Other Ambulatory Visit: Payer: Self-pay | Admitting: Physical Medicine & Rehabilitation

## 2018-12-18 ENCOUNTER — Other Ambulatory Visit: Payer: Self-pay | Admitting: Family Medicine

## 2018-12-20 ENCOUNTER — Telehealth: Payer: Self-pay | Admitting: *Deleted

## 2018-12-20 NOTE — Telephone Encounter (Signed)
Maria Powers called and she needs a PA done on her Tramadol.

## 2018-12-22 NOTE — Telephone Encounter (Signed)
Prior authorization approved, patient notified, pharmacy notified

## 2018-12-29 ENCOUNTER — Telehealth: Payer: Self-pay | Admitting: *Deleted

## 2018-12-29 ENCOUNTER — Other Ambulatory Visit: Payer: Self-pay

## 2018-12-29 ENCOUNTER — Encounter: Payer: Self-pay | Admitting: Family Medicine

## 2018-12-29 ENCOUNTER — Ambulatory Visit (INDEPENDENT_AMBULATORY_CARE_PROVIDER_SITE_OTHER): Payer: PRIVATE HEALTH INSURANCE | Admitting: Family Medicine

## 2018-12-29 DIAGNOSIS — I10 Essential (primary) hypertension: Secondary | ICD-10-CM

## 2018-12-29 DIAGNOSIS — Z6841 Body Mass Index (BMI) 40.0 and over, adult: Secondary | ICD-10-CM | POA: Diagnosis not present

## 2018-12-29 NOTE — Telephone Encounter (Signed)
Per office notes from 4/9, I left a detailed message for the pt to return a call to the office to schedule a 2 week follow up.

## 2018-12-29 NOTE — Progress Notes (Signed)
Virtual Visit via Video Note  I connected with Maria Powers on 12/29/18 at 10:45 AM EDT by a video enabled telemedicine application and verified that I am speaking with the correct person using two identifiers.  Location patient: home Location provider:work or home office Persons participating in the virtual visit: patient, provider  I discussed the limitations of evaluation and management by telemedicine and the availability of in person appointments. The patient expressed understanding and agreed to proceed.   HPI:  Follow up:  Hypertension: -meds: Amlodipine -she thinks he BP has been fine -no CO, SOB, HA, vision changes -reports always high at Dr. Wynn Banker, but not elsewhere -has home cuff but needs batteries and has not been checking -no alcohol or tobacco use -otherwise not concerns or complaints  ROS: See pertinent positives and negatives per HPI.  Past Medical History:  Diagnosis Date  . Anxiety   . Bipolar 1 disorder (HCC)   . Chronic pain    back, hip - seeing PMR  . Depression   . Dysmenorrhea   . Hypertension   . Obesity     Past Surgical History:  Procedure Laterality Date  . KIDNEY STONE SURGERY  2007  . KNEE ARTHROSCOPY Right 2003  . KNEE ARTHROSCOPY Right 2007  . SHOULDER ARTHROSCOPY Right 2007  . WISDOM TOOTH EXTRACTION  Highschool    Family History  Problem Relation Age of Onset  . Addison's disease Mother   . Diabetes Mother   . Bipolar disorder Mother   . Fibroids Maternal Aunt     SOCIAL HX: see hpi   Current Outpatient Medications:  .  amLODipine (NORVASC) 5 MG tablet, TAKE 1 TABLET BY MOUTH EVERY DAY, Disp: 30 tablet, Rfl: 0 .  busPIRone (BUSPAR) 10 MG tablet, Take 20 mg by mouth 2 (two) times daily. , Disp: , Rfl:  .  cyclobenzaprine (FLEXERIL) 10 MG tablet, TAKE 1 TABLET BY MOUTH THREE TIMES DAILY, Disp: 270 tablet, Rfl: 1 .  doxepin (SINEQUAN) 10 MG capsule, , Disp: , Rfl: 0 .  FLUoxetine (PROZAC) 40 MG capsule, Take 40 mg by mouth  daily., Disp: , Rfl:  .  gabapentin (NEURONTIN) 300 MG capsule, TAKE 1 CAPSULE(300 MG) BY MOUTH TWICE DAILY, Disp: 180 capsule, Rfl: 1 .  lamoTRIgine (LAMICTAL) 150 MG tablet, Take 150 mg by mouth. 2 tabs at bedtime, Disp: , Rfl:  .  levonorgestrel (MIRENA) 20 MCG/24HR IUD, 1 each by Intrauterine route once., Disp: , Rfl:  .  lithium carbonate 300 MG capsule, Take 300 mg by mouth 2 (two) times daily with a meal. , Disp: , Rfl: 1 .  rizatriptan (MAXALT-MLT) 5 MG disintegrating tablet, Take 1 tablet (5 mg total) by mouth as needed., Disp: 10 tablet, Rfl: 0 .  traMADol (ULTRAM) 50 MG tablet, TAKE 1 TABLET(50 MG) BY MOUTH FOUR TIMES DAILY, Disp: 120 tablet, Rfl: 4  EXAM:  VITALS per patient if applicable: none  GENERAL: alert, oriented, appears well and in no acute distress  HEENT: atraumatic, conjunttiva clear, no obvious abnormalities on inspection of external nose and ears  NECK: normal movements of the head and neck  LUNGS: on inspection no signs of respiratory distress, breathing rate appears normal, no obvious gross SOB, gasping or wheezing  CV: no obvious cyanosis  MS: moves all visible extremities without noticeable abnormality  PSYCH/NEURO: pleasant and cooperative, no obvious depression or anxiety, speech and thought processing grossly intact  ASSESSMENT AND PLAN:  Discussed the following assessment and plan:  Essential hypertension  BMI  40.0-44.9, adult (HCC)  Lifestyle recs - advised a healthy low sugar/low sodium diet and regular exercise.  Advised we need to monitor BP. Prefers to not come to office if possible in light of COVID 19 pandemic. She is willing to do home BP monitoring and follow up in a few weeks to go over these logs to see if adjustment in medication needed.   I discussed the assessment and treatment plan with the patient. The patient was provided an opportunity to ask questions and all were answered. The patient agreed with the plan and demonstrated an  understanding of the instructions.   Advised to call if any concerns in interim.   Follow up instructions: Advised assistant Ronnald CollumJo Anne to help patient arrange the following: -follow up BP with Dr. Selena BattenKim in 2 weeks    Terressa KoyanagiHannah R Kim, DO

## 2019-01-22 ENCOUNTER — Other Ambulatory Visit: Payer: Self-pay | Admitting: Physical Medicine & Rehabilitation

## 2019-03-10 ENCOUNTER — Other Ambulatory Visit: Payer: Self-pay | Admitting: Family Medicine

## 2019-03-16 ENCOUNTER — Other Ambulatory Visit: Payer: Self-pay | Admitting: Physical Medicine & Rehabilitation

## 2019-04-10 ENCOUNTER — Other Ambulatory Visit: Payer: Self-pay | Admitting: Physical Medicine & Rehabilitation

## 2019-04-11 ENCOUNTER — Other Ambulatory Visit: Payer: Self-pay

## 2019-04-11 ENCOUNTER — Encounter
Payer: PRIVATE HEALTH INSURANCE | Attending: Physical Medicine & Rehabilitation | Admitting: Physical Medicine & Rehabilitation

## 2019-04-11 ENCOUNTER — Other Ambulatory Visit: Payer: Self-pay | Admitting: Family Medicine

## 2019-04-11 ENCOUNTER — Encounter: Payer: Self-pay | Admitting: Physical Medicine & Rehabilitation

## 2019-04-11 DIAGNOSIS — M533 Sacrococcygeal disorders, not elsewhere classified: Secondary | ICD-10-CM | POA: Insufficient documentation

## 2019-04-11 MED ORDER — TRAMADOL HCL 50 MG PO TABS: ORAL_TABLET | ORAL | 1 refills | Status: DC

## 2019-04-11 NOTE — Patient Instructions (Signed)
Repeat sacroiliac RF next visit  Will be able to wean off tramadol once RF if repeated

## 2019-04-11 NOTE — Progress Notes (Signed)
Subjective:    Patient ID: Maria Powers, female    DOB: 17-Feb-1976, 43 y.o.   MRN: [id number] 07/28/2018 Right Sacroiliac radio frequency ablation under fluoroscopic guidance This consists of L5 dorsal ramus radio frequency ablation plus RFA of S1-S2 -S3 dorsal rami HPI   A little slow at work working [employer] but only 25 hrs/wk  Right sacroiliac pain increasing over the last 1 month  Finished Tramadol today.  Had been off of this medication until the right sacroiliac pain started increasing once again. No new symptoms such as numbness or tingling in the lower extremity no progressive weakness.  No bowel or bladder dysfunction Pain Inventory Average Pain 6 Pain Right Now 4 My pain is constant, sharp, dull, stabbing and aching  In the last 24 hours, has pain interfered with the following? General activity 8 Relation with others 8 Enjoyment of life 8 What TIME of day is your pain at its worst? all Sleep (in general) Fair  Pain is worse with: unsure Pain improves with: medication and injections Relief from Meds: 5  Mobility Do you have any goals in this area?  no  Function Do you have any goals in this area?  no  Neuro/Psych No problems in this area  Prior Studies Any changes since last visit?  no  Physicians involved in your care Any changes since last visit?  no   Family History  Problem Relation Age of Onset  . Addison's disease Mother   . Diabetes Mother   . Bipolar disorder Mother   . Fibroids Maternal Aunt    Social History   Socioeconomic History  . Marital status: Single    Spouse name: Not on file  . Number of children: Not on file  . Years of education: Not on file  . Highest education level: Not on file  Occupational History  . Not on file  Social Needs  . Financial resource strain: Not on file  . Food insecurity    Worry: Not on file    Inability: Not on file  . Transportation needs    Medical: Not on file    Non-medical: Not on file   Tobacco Use  . Smoking status: Former Research scientist (life sciences)  . Smokeless tobacco: Never Used  Substance and Sexual Activity  . Alcohol use: No    Alcohol/week: 0.0 standard drinks  . Drug use: No  . Sexual activity: Not Currently    Partners: Male    Birth control/protection: Abstinence, I.U.D.    Comment: Mirena IUD inserted 06-21-15  Lifestyle  . Physical activity    Days per week: Not on file    Minutes per session: Not on file  . Stress: Not on file  Relationships  . Social Herbalist on phone: Not on file    Gets together: Not on file    Attends religious service: Not on file    Active member of club or organization: Not on file    Attends meetings of clubs or organizations: Not on file    Relationship status: Not on file  Other Topics Concern  . Not on file  Social History Narrative   Work or School: Armed forces technical officer      Home Situation: roommate      Spiritual Beliefs:mormon      Lifestyle: no regular exercise; horrible diet            Past Surgical History:  Procedure Laterality Date  . KIDNEY STONE SURGERY  2007  . KNEE ARTHROSCOPY Right 2003  . KNEE ARTHROSCOPY Right 2007  . SHOULDER ARTHROSCOPY Right 2007  . WISDOM TOOTH EXTRACTION  Highschool   Past Medical History:  Diagnosis Date  . Anxiety   . Bipolar 1 disorder (HCC)   . Chronic pain    back, hip - seeing PMR  . Depression   . Dysmenorrhea   . Hypertension   . Obesity    BP 133/87   Pulse 91   Temp 98.1 F (36.7 C)   Ht 5\' 5"  (1.651 m)   Wt 247 lb (112 kg)   SpO2 97%   BMI 41.10 kg/m   Opioid Risk Score:   Fall Risk Score:  `1  Depression screen PHQ 2/9  [facility] 11/21/2018 07/28/2018 02/28/2018 11/15/2017 10/24/2015 06/18/2015 01/03/2015  Decreased Interest 1 3 1  0 1 1 1   Down, Depressed, Hopeless 1 3 1 1 1 1 1   PHQ - 2 Score 2 6 2 1 2 2 2   Altered sleeping - - - - - - 3  Tired, decreased energy - - - - - - 2  Change in appetite - - - - - - 0  Feeling  bad or failure about yourself  - - - - - - 1  Trouble concentrating - - - - - - 1  Moving slowly or fidgety/restless - - - - - - 0  Suicidal thoughts - - - - - - 0  PHQ-9 Score - - - - - - 9     Review of Systems  Constitutional: Negative.   HENT: Negative.   Eyes: Negative.   Respiratory: Negative.   Cardiovascular: Negative.   Gastrointestinal: Negative.   Endocrine: Negative.   Genitourinary: Negative.   Musculoskeletal: Negative.   Skin: Negative.   Allergic/Immunologic: Negative.   Neurological: Negative.   Hematological: Negative.   Psychiatric/Behavioral: Negative.   All other systems reviewed and are negative.      Objective:   Physical Exam Vitals signs reviewed.  Constitutional:      Appearance: She is obese.  HENT:     Head: Normocephalic and atraumatic.  Eyes:     Extraocular Movements: Extraocular movements intact.     Conjunctiva/sclera: Conjunctivae normal.     Pupils: Pupils are equal, round, and reactive to light.  Musculoskeletal:     Lumbar back: She exhibits decreased range of motion and tenderness. She exhibits no deformity.     Comments: Diminished lumbar spine range of motion 50% flexion extension lateral bending and rotation  Neurological:     General: No focal deficit present.     Mental Status: She is alert and oriented to person, place, and time.     Comments: Motor strength is 5/5 bilateral hip flexor knee extensor ankle dorsiflexor Sensation intact to pinprick bilateral L3-L4-L5 S1 dermatome distribution  Psychiatric:        Mood and Affect: Mood normal.        Behavior: Behavior normal.        Thought Content: Thought content normal.        Judgment: Judgment normal.      Tenderness to palpation right PSIS area negative straight leg raising Gait is without evidence of toe drag or knee instability     Assessment & Plan:  1.  Right sacroiliac discomfort has had good results with right sacroiliac radiofrequency neurotomy with  4372-month relief.  This is allowed the patient to discontinue narcotic analgesics.  She will need  to restart until we can get the L5 dorsal ramus S1-S2 lateral branch radiofrequency neurotomy procedure scheduled.  Will repeat once authorized.

## 2019-04-13 ENCOUNTER — Telehealth: Payer: Self-pay

## 2019-04-13 NOTE — Telephone Encounter (Addendum)
Patient stated while at her appointment on 04-11-2019 with Dr. Letta Pate that every other time she is prescribed tramadol medication that she is always told that it needs a prior authorization.    Started a prior auth as a way to investigate this complaint.  Submitted on 04-12-2019  Prior auth returned as being cancelled on 04-12-2019 due to it already having a paid claim for the medication on 04-11-2019.  Called patients insurance company to find out length of current prior authorization for the tramadol.  Spoke with Geophysical data processor and was told that based on her diagnosis and type of coverage that they INDEED need a MONTH TO MONTH prior authorization for tramadol and that the current prior authorization is good from 04-11-2019 until 05-12-2019.   Lastly was told on same phone call that beginning august of 2020 that tramadol will no longer require a prior authorization as it is preferred teir 1.  Patient notified of all findings.

## 2019-05-12 ENCOUNTER — Other Ambulatory Visit: Payer: Self-pay | Admitting: Family Medicine

## 2019-05-17 ENCOUNTER — Encounter
Payer: PRIVATE HEALTH INSURANCE | Attending: Physical Medicine & Rehabilitation | Admitting: Physical Medicine & Rehabilitation

## 2019-05-17 ENCOUNTER — Encounter: Payer: Self-pay | Admitting: Physical Medicine & Rehabilitation

## 2019-05-17 ENCOUNTER — Other Ambulatory Visit: Payer: Self-pay

## 2019-05-17 ENCOUNTER — Telehealth: Payer: Self-pay

## 2019-05-17 VITALS — BP 145/93 | HR 88 | Temp 98.9°F | Ht 65.0 in | Wt 243.0 lb

## 2019-05-17 DIAGNOSIS — M533 Sacrococcygeal disorders, not elsewhere classified: Secondary | ICD-10-CM | POA: Diagnosis not present

## 2019-05-17 NOTE — Patient Instructions (Signed)
You had a radio frequency procedure today This was done to alleviate joint pain in your lumbar area We injected lidocaine which is a local anesthetic.  You may experience soreness at the injection sites. You may also experienced some irritation of the nerves that were heated I'm recommending ice for 30 minutes every 2 hours as needed for the next 24-48 hours   

## 2019-05-17 NOTE — Progress Notes (Signed)
  PROCEDURE RECORD [facility]   Name: Maria Powers DOB:April 11, 1976 MRN: [id number]  Date:05/17/2019  Physician: Alysia Penna, MD    Nurse/CMA: Tynetta Bachmann CMA  Allergies:  Allergies  Allergen Reactions  . Clindamycin/Lincomycin Nausea And Vomiting  . Nubain [Nalbuphine Hcl] Other (See Comments)    *feels like something crawling on her*    Consent Signed: Yes.    Is patient diabetic? No.  CBG today? NA  Pregnant: No. LMP: No LMP recorded. (Menstrual status: IUD). (age 52-55)  Anticoagulants: no Anti-inflammatory: no Antibiotics: no  Procedure: Right L5, S1-3 RFA Position: Prone   Start Time: 1052am End Time: 1112am Fluoro Time: 48s  RN/CMA Quamir Willemsen CMA Airam Heidecker CMA    Time 1033am 1119am    BP 145/93 147/90    Pulse 88 86    Respirations 16 16    O2 Sat 98 97    S/S 6 6    Pain Level 5/10 9/10     D/C home with Aunt Bridgette, patient A & O X 3, D/C instructions reviewed, and sits independently.

## 2019-05-17 NOTE — Telephone Encounter (Signed)
Recd voicemail from [name]) [phone] -- returned call to her "confidentiual voicemail x2 9:54 am and 1:56 pm - she has question about procedure for prior auth.  Caled back at 3:23 pm number on ptn card [phone]- they state she is not answering skype - they took info to return call

## 2019-05-17 NOTE — Progress Notes (Signed)
Right Sacroiliac radio frequency ablation under fluoroscopic guidance This consists of L5 dorsal ramus radio frequency ablation plus RFA of S1-S2 -S3 dorsal rami good relief for >68mo after last sacroiliac RFA performed in Nov 2019  Indication is sacroiliac pain which has improved temporarily on 2 or more occasions by at least 50% following sacroiliac intra-articular injection under fluoroscopic guidance. Pain interferes with self-care and mobility and has failed to respond to conservative measures.  Informed consent was obtained after discussing risks and benefits of the procedure with the patient these include bleeding bruising and infection temporary or permanent paralysis. The patient elects to proceed and has given written consent.  Patient placed prone on fluoroscopy table. Area marked and prepped with Betadine. Fluoroscopic images utilized to guide needle. 25-gauge 1.5 inch needle was used to anesthetize 4 injection points with 2 cc of 1% lidocaine each. Then a 18-gauge 10 cm RF needle with a 10 mm curved active tip was inserted under fluoroscopic guidance targeting the S1 SA P./sacral ala junction, bone contact made and confirmed with lateral imaging.  motor stimulation at 2 Hz confirmed proper needle location followed by injection of one cc of a solution  1% lidocaine MPF. Then the inferolateral aspect of the S1, S2 and lateral aspect of S3 sacral foramina were targeted. Bone contact made. motor stim at 2 Hz confirm proper needle location. One ML of the 1% MPF lidocaine solution was injected into each of 3 sites and radio frequency ablation 80C for 90 seconds was performed. Patient tolerated procedure well. Post procedure instructions given

## 2019-05-25 ENCOUNTER — Ambulatory Visit: Payer: PRIVATE HEALTH INSURANCE | Admitting: Physical Medicine & Rehabilitation

## 2019-06-11 ENCOUNTER — Other Ambulatory Visit: Payer: Self-pay | Admitting: Physical Medicine & Rehabilitation

## 2019-06-23 ENCOUNTER — Other Ambulatory Visit: Payer: Self-pay | Admitting: Physical Medicine & Rehabilitation

## 2019-07-13 ENCOUNTER — Other Ambulatory Visit: Payer: Self-pay | Admitting: Family Medicine

## 2019-08-15 ENCOUNTER — Other Ambulatory Visit: Payer: Self-pay | Admitting: Physical Medicine & Rehabilitation

## 2019-09-13 ENCOUNTER — Ambulatory Visit: Payer: PRIVATE HEALTH INSURANCE | Attending: Internal Medicine

## 2019-09-13 DIAGNOSIS — Z20822 Contact with and (suspected) exposure to covid-19: Secondary | ICD-10-CM

## 2019-09-15 LAB — NOVEL CORONAVIRUS, NAA: SARS-CoV-2, NAA: NOT DETECTED

## 2019-09-17 ENCOUNTER — Other Ambulatory Visit: Payer: Self-pay | Admitting: Family Medicine

## 2019-09-18 ENCOUNTER — Telehealth (INDEPENDENT_AMBULATORY_CARE_PROVIDER_SITE_OTHER): Payer: PRIVATE HEALTH INSURANCE | Admitting: Family Medicine

## 2019-09-18 ENCOUNTER — Encounter: Payer: Self-pay | Admitting: Family Medicine

## 2019-09-18 VITALS — Ht 65.0 in

## 2019-09-18 DIAGNOSIS — J019 Acute sinusitis, unspecified: Secondary | ICD-10-CM | POA: Diagnosis not present

## 2019-09-18 DIAGNOSIS — Z20822 Contact with and (suspected) exposure to covid-19: Secondary | ICD-10-CM

## 2019-09-18 DIAGNOSIS — Z20828 Contact with and (suspected) exposure to other viral communicable diseases: Secondary | ICD-10-CM | POA: Diagnosis not present

## 2019-09-18 DIAGNOSIS — J069 Acute upper respiratory infection, unspecified: Secondary | ICD-10-CM

## 2019-09-18 MED ORDER — AMOXICILLIN-POT CLAVULANATE 875-125 MG PO TABS: 1.0000 | ORAL_TABLET | Freq: Two times a day (BID) | ORAL | 0 refills | Status: DC

## 2019-09-18 MED ORDER — FLUTICASONE PROPIONATE 50 MCG/ACT NA SUSP: 1.0000 | Freq: Two times a day (BID) | NASAL | 0 refills | Status: DC

## 2019-09-18 MED ORDER — PREDNISONE 20 MG PO TABS: 40.0000 mg | ORAL_TABLET | Freq: Every day | ORAL | 0 refills | Status: AC

## 2019-09-18 NOTE — Progress Notes (Signed)
Virtual Visit via Video Note   I connected with Maria Powers on 09/18/19 by a video enabled telemedicine application and verified that I am speaking with the correct person using two identifiers.  Location patient: home Location provider:work office Persons participating in the virtual visit: patient, provider  I discussed the limitations of evaluation and management by telemedicine and the availability of in person appointments. The patient expressed understanding and agreed to proceed.   HPI: Maria Powers is a 43 yo female with Hx of headaches and HTN who is c/o 7 days of upper respiratory symptoms. Monday she felt "terrible." Non productive cough and CP, these have resolved. Nasal congestion, rhinorrhea,"heavy" feeling on sinuses.post nasal drainage, Tuesday she learned that her roommate was positive for COVID 19, she was having symptoms. Taste is "off" and she cannot breath through nose,so cannot smell.  She has not noted fever,chills,sore throat,SOB,wheezing, abdominal pain,N/V,chnages in bowel habits,or urinary symptom.  Wednesday she had a negative COVID 19 test. She took Nyquil from Wednesday to Saturday. Saturday she felt like symptom were worse. Denies Hx of allergic rhinitis.    ROS: See pertinent positives and negatives per HPI.  Past Medical History:  Diagnosis Date  . Anxiety   . Bipolar 1 disorder (HCC)   . Chronic pain    back, hip - seeing PMR  . Depression   . Dysmenorrhea   . Hypertension   . Obesity     Past Surgical History:  Procedure Laterality Date  . KIDNEY STONE SURGERY  2007  . KNEE ARTHROSCOPY Right 2003  . KNEE ARTHROSCOPY Right 2007  . SHOULDER ARTHROSCOPY Right 2007  . WISDOM TOOTH EXTRACTION  Highschool    Family History  Problem Relation Age of Onset  . Addison's disease Mother   . Diabetes Mother   . Bipolar disorder Mother   . Fibroids Maternal Aunt     Social History   Socioeconomic History  . Marital status: Single   Spouse name: Not on file  . Number of children: Not on file  . Years of education: Not on file  . Highest education level: Not on file  Occupational History  . Not on file  Tobacco Use  . Smoking status: Former Games developer  . Smokeless tobacco: Never Used  Substance and Sexual Activity  . Alcohol use: No    Alcohol/week: 0.0 standard drinks  . Drug use: No  . Sexual activity: Not Currently    Partners: Male    Birth control/protection: Abstinence, I.U.D.    Comment: Mirena IUD inserted 06-21-15  Other Topics Concern  . Not on file  Social History Narrative   Work or School: Pharmacist, hospital      Home Situation: roommate      Spiritual Beliefs:mormon      Lifestyle: no regular exercise; horrible diet            Social Determinants of Health   Financial Resource Strain:   . Difficulty of Paying Living Expenses: Not on file  Food Insecurity:   . Worried About Programme researcher, broadcasting/film/video in the Last Year: Not on file  . Ran Out of Food in the Last Year: Not on file  Transportation Needs:   . Lack of Transportation (Medical): Not on file  . Lack of Transportation (Non-Medical): Not on file  Physical Activity:   . Days of Exercise per Week: Not on file  . Minutes of Exercise per Session: Not on file  Stress:   . Feeling of  Stress : Not on file  Social Connections:   . Frequency of Communication with Friends and Family: Not on file  . Frequency of Social Gatherings with Friends and Family: Not on file  . Attends Religious Services: Not on file  . Active Member of Clubs or Organizations: Not on file  . Attends Archivist Meetings: Not on file  . Marital Status: Not on file  Intimate Partner Violence:   . Fear of Current or Ex-Partner: Not on file  . Emotionally Abused: Not on file  . Physically Abused: Not on file  . Sexually Abused: Not on file    Current Outpatient Medications:  .  amLODipine (NORVASC) 5 MG tablet, TAKE 1 TABLET BY MOUTH EVERY DAY,  Disp: 30 tablet, Rfl: 0 .  busPIRone (BUSPAR) 10 MG tablet, Take 20 mg by mouth 2 (two) times daily. , Disp: , Rfl:  .  cyclobenzaprine (FLEXERIL) 10 MG tablet, TAKE 1 TABLET BY MOUTH THREE TIMES DAILY, Disp: 270 tablet, Rfl: 1 .  gabapentin (NEURONTIN) 300 MG capsule, TAKE 1 CAPSULE(300 MG) BY MOUTH TWICE DAILY, Disp: 180 capsule, Rfl: 0 .  levonorgestrel (MIRENA) 20 MCG/24HR IUD, 1 each by Intrauterine route once., Disp: , Rfl:  .  rizatriptan (MAXALT-MLT) 5 MG disintegrating tablet, Take 1 tablet (5 mg total) by mouth as needed., Disp: 10 tablet, Rfl: 0 .  traMADol (ULTRAM) 50 MG tablet, TAKE 1 TABLET BY MOUTH FOUR TIMES DAILY, Disp: 120 tablet, Rfl: 1 .  amoxicillin-clavulanate (AUGMENTIN) 875-125 MG tablet, Take 1 tablet by mouth 2 (two) times daily., Disp: 20 tablet, Rfl: 0 .  fluticasone (FLONASE) 50 MCG/ACT nasal spray, Place 1 spray into both nostrils 2 (two) times daily., Disp: 16 g, Rfl: 0 .  predniSONE (DELTASONE) 20 MG tablet, Take 2 tablets (40 mg total) by mouth daily with breakfast for 3 days., Disp: 6 tablet, Rfl: 0  EXAM:  VITALS per patient if applicable:Ht 5\' 5"  (1.651 m)   BMI 40.44 kg/m   GENERAL: alert, oriented, appears well and in no acute distress  HEENT: atraumatic, conjunctiva clear, no obvious abnormalities on inspection of external nose and ears Nasal voice and mouth breathing.  NECK: normal movements of the head and neck  LUNGS: on inspection no signs of respiratory distress, breathing rate appears normal, no obvious gross SOB, gasping or wheezing  CV: no obvious cyanosis  Maria: moves all visible extremities without noticeable abnormality  PSYCH/NEURO: pleasant and cooperative, no obvious depression or anxiety, speech and thought processing grossly intact  ASSESSMENT AND PLAN:  Discussed the following assessment and plan:  URI, acute Explained that it is most likely viral. Recommend symptomatic treatment for now. Flonase nasal spray and nasal saline  irrigations may help.  Instructed to monitor for new symptoms. Adequate hydration and rest.  Acute non-recurrent sinusitis, unspecified location I do not think abx is needed at this time. Prednisone 20 mg 2 tabs x 3 days with breakfast may help. Some side effects discussed.  Instructed to hold on taking abx for 2-3 days and do so if symptoms are not any better or fever develops. Plain Mucinex also recommended. For now avoid OTC antihistaminics.  Exposure to COVID-19 virus Reporting negative test but has changes in taste. Monitor for worsening symptoms. Stay in quarantine for 7 days as far as she has no fever.    I discussed the assessment and treatment plan with the patient. She was provided an opportunity to ask questions and all were answered. She agreed with  the plan and demonstrated an understanding of the instructions.    Return if symptoms worsen or fail to improve.    Jensen Cheramie [place], MD

## 2019-10-13 ENCOUNTER — Encounter: Payer: 59 | Attending: Physical Medicine & Rehabilitation | Admitting: Physical Medicine & Rehabilitation

## 2019-10-13 ENCOUNTER — Other Ambulatory Visit: Payer: Self-pay

## 2019-10-13 ENCOUNTER — Encounter: Payer: Self-pay | Admitting: Physical Medicine & Rehabilitation

## 2019-10-13 VITALS — BP 139/93 | HR 98 | Temp 98.3°F | Ht 65.0 in | Wt 241.0 lb

## 2019-10-13 DIAGNOSIS — G894 Chronic pain syndrome: Secondary | ICD-10-CM

## 2019-10-13 MED ORDER — TRAMADOL HCL 50 MG PO TABS: 50.0000 mg | ORAL_TABLET | Freq: Four times a day (QID) | ORAL | 1 refills | Status: DC

## 2019-10-13 MED ORDER — KETOROLAC TROMETHAMINE 60 MG/2ML IM SOLN
60.0000 mg | Freq: Once | INTRAMUSCULAR | Status: AC
  Administered 2019-10-13: 60 mg via INTRAMUSCULAR

## 2019-10-13 NOTE — Progress Notes (Signed)
Subjective:    Patient ID: Maria Powers, female    DOB: 12/04/1975, 44 y.o.   MRN: [id number]  HPI  44 year old female with chronic sacroiliac mediated pain.  She has done well with sacroiliac intra-articular injections but only had about a 47-month duration of effect.  She did well with L5 dorsal ramus S1-S2-S3 lateral branch blocks followed by radiofrequency neurotomy of the same nerves performed on 05/17/2019.  She feels like over the last month she has had increasing pain and feels like she needs another procedure.  She is now about 5 months post radiofrequency neurotomy. Pt still working full time  Pain Inventory Average Pain 5 Pain Right Now 8 My pain is constant, sharp, stabbing and aching  In the last 24 hours, has pain interfered with the following? General activity 8 Relation with others 8 Enjoyment of life 8 What TIME of day is your pain at its worst? night Sleep (in general) Fair  Pain is worse with: walking, sitting, inactivity and standing Pain improves with: medication Relief from Meds: 5  Mobility Do you have any goals in this area?  no  Function Do you have any goals in this area?  no  Neuro/Psych No problems in this area  Prior Studies Any changes since last visit?  no  Physicians involved in your care Any changes since last visit?  no   Family History  Problem Relation Age of Onset  . Addison's disease Mother   . Diabetes Mother   . Bipolar disorder Mother   . Fibroids Maternal Aunt    Social History   Socioeconomic History  . Marital status: Single    Spouse name: Not on file  . Number of children: Not on file  . Years of education: Not on file  . Highest education level: Not on file  Occupational History  . Not on file  Tobacco Use  . Smoking status: Former Research scientist (life sciences)  . Smokeless tobacco: Never Used  Substance and Sexual Activity  . Alcohol use: No    Alcohol/week: 0.0 standard drinks  . Drug use: No  . Sexual activity: Not Currently     Partners: Male    Birth control/protection: Abstinence, I.U.D.    Comment: Mirena IUD inserted 06-21-15  Other Topics Concern  . Not on file  Social History Narrative   Work or School: Armed forces technical officer      Home Situation: roommate      Spiritual Beliefs:mormon      Lifestyle: no regular exercise; horrible diet            Social Determinants of Health   Financial Resource Strain:   . Difficulty of Paying Living Expenses: Not on file  Food Insecurity:   . Worried About Charity fundraiser in the Last Year: Not on file  . Ran Out of Food in the Last Year: Not on file  Transportation Needs:   . Lack of Transportation (Medical): Not on file  . Lack of Transportation (Non-Medical): Not on file  Physical Activity:   . Days of Exercise per Week: Not on file  . Minutes of Exercise per Session: Not on file  Stress:   . Feeling of Stress : Not on file  Social Connections:   . Frequency of Communication with Friends and Family: Not on file  . Frequency of Social Gatherings with Friends and Family: Not on file  . Attends Religious Services: Not on file  . Active Member of Clubs or Organizations: Not  on file  . Attends Banker Meetings: Not on file  . Marital Status: Not on file   Past Surgical History:  Procedure Laterality Date  . KIDNEY STONE SURGERY  2007  . KNEE ARTHROSCOPY Right 2003  . KNEE ARTHROSCOPY Right 2007  . SHOULDER ARTHROSCOPY Right 2007  . WISDOM TOOTH EXTRACTION  Highschool   Past Medical History:  Diagnosis Date  . Anxiety   . Bipolar 1 disorder (HCC)   . Chronic pain    back, hip - seeing PMR  . Depression   . Dysmenorrhea   . Hypertension   . Obesity    BP (!) 139/93   Pulse 98   Temp 98.3 F (36.8 C)   Ht 5\' 5"  (1.651 m)   Wt 241 lb (109.3 kg)   SpO2 97%   BMI 40.10 kg/m   Opioid Risk Score:   Fall Risk Score:  `1  Depression screen PHQ 2/9  Depression screen [facility] 2/9 11/21/2018 07/28/2018 02/28/2018  11/15/2017 10/24/2015 06/18/2015 01/03/2015  Decreased Interest 1 3 1  0 1 1 1   Down, Depressed, Hopeless 1 3 1 1 1 1 1   PHQ - 2 Score 2 6 2 1 2 2 2   Altered sleeping - - - - - - 3  Tired, decreased energy - - - - - - 2  Change in appetite - - - - - - 0  Feeling bad or failure about yourself  - - - - - - 1  Trouble concentrating - - - - - - 1  Moving slowly or fidgety/restless - - - - - - 0  Suicidal thoughts - - - - - - 0  PHQ-9 Score - - - - - - 9   Review of Systems  All other systems reviewed and are negative.      Objective:   Physical Exam Constitutional:      Appearance: She is obese.  HENT:     Head: Normocephalic and atraumatic.  Eyes:     Extraocular Movements: Extraocular movements intact.     Conjunctiva/sclera: Conjunctivae normal.     Pupils: Pupils are equal, round, and reactive to light.  Neurological:     General: No focal deficit present.     Mental Status: She is alert and oriented to person, place, and time. Mental status is at baseline.  Psychiatric:        Mood and Affect: Mood normal.        Behavior: Behavior normal.   Right PSIS tenderness no tenderness over the right greater tuberosity of the femur.  No pain with right hip internal/external rotation.  Negative straight leg raising. Motor strength is 5/5 bilateral hip flexor knee extensor ankle dorsiflexor Gait is without evidence of toe drag or knee instability.         Assessment & Plan:  1.  Chronic right sacroiliac pain.  Overall she is doing okay except the pain relief from the radiofrequency neurotomy is starting to wear off.  We will schedule her next month for repeat.  Given that her duration is less than 6 months would recommend right L5 dorsal ramus right S1-S2-S3 lateral branch radiofrequency neurotomy using Coolief system  The patient is a little disappointed that she could not have this procedure today We will try to temporize the situation with Toradol injection 60 mg IM today

## 2019-10-13 NOTE — Patient Instructions (Signed)
Will do coolief RF for Right sacroiliac

## 2019-10-15 ENCOUNTER — Other Ambulatory Visit: Payer: Self-pay | Admitting: Family Medicine

## 2019-10-18 ENCOUNTER — Other Ambulatory Visit: Payer: Self-pay | Admitting: Physical Medicine & Rehabilitation

## 2019-11-10 ENCOUNTER — Encounter: Payer: Self-pay | Admitting: Physical Medicine & Rehabilitation

## 2019-11-10 ENCOUNTER — Other Ambulatory Visit: Payer: Self-pay

## 2019-11-10 ENCOUNTER — Encounter: Payer: 59 | Attending: Physical Medicine & Rehabilitation | Admitting: Physical Medicine & Rehabilitation

## 2019-11-10 VITALS — BP 151/92 | HR 89 | Temp 97.9°F | Ht 65.0 in | Wt 240.0 lb

## 2019-11-10 DIAGNOSIS — G894 Chronic pain syndrome: Secondary | ICD-10-CM | POA: Insufficient documentation

## 2019-11-10 DIAGNOSIS — M545 Low back pain, unspecified: Secondary | ICD-10-CM

## 2019-11-10 DIAGNOSIS — M79604 Pain in right leg: Secondary | ICD-10-CM

## 2019-11-10 DIAGNOSIS — M533 Sacrococcygeal disorders, not elsewhere classified: Secondary | ICD-10-CM

## 2019-11-10 NOTE — Progress Notes (Signed)
  PROCEDURE RECORD [facility]   Name: Maria Powers DOB:September 28, 1975 MRN: [id number]  Date:11/10/2019  Physician: Claudette Laws, MD    Nurse/CMA: Bright, CMA  Allergies:  Allergies  Allergen Reactions  . Clindamycin/Lincomycin Nausea And Vomiting  . Nubain [Nalbuphine Hcl] Other (See Comments)    *feels like something crawling on her*    Consent Signed: Yes.    Is patient diabetic? No.  CBG today?   Pregnant: No. LMP: No LMP recorded. (Menstrual status: IUD). (age 33-55)  Anticoagulants: no Anti-inflammatory: yes (Aleve  11:00 am) Antibiotics: no  Procedure: right L5 dorsal ramus, s1-3  radiofrequency   Position: Prone   Start Time: 138pm End Time: 214 Pm Fluoro Time: 49s  RN/CMA Jaz Laningham, CMA Bright, CMA    Time 1:20pm 224pm    BP 151`/92 153/92    Pulse 89 84    Respirations 14 14    O2 Sat 98 98    S/S 6 6    Pain Level 5/10 6/10     D/C home with aunt, patient A & O X 3, D/C instructions reviewed, and sits independently.

## 2019-11-10 NOTE — Progress Notes (Signed)
Sacroiliac Coolief Radiofrequency Neurotomy  Indication :  Chronic Sacroiliac pain following lumbar fusion that has improved by >50% after both sacroiliac intra articular injection as well as L5 dorsal ramus , S1,2,3 lateral branch blocks under fluoro guidance.  Pain interferes with ADLs and mobility and has not responded to conservative care including physical therapy and medication management.  Informed consent was obtained after describing risks and benefits of the procedure with the patient these include bleeding bruising infection as well as medication reaction.  Patient elects to proceed and has given written consent.  Patient placed prone on fluoroscopy table Betadine prep over the lower lumbar and sacral area.  Fluoroscopy images were obtained to visualize the sacral foramina as well as lateral images to judge the angulation of the sacrum.  A 25-gauge 1.5 inch needle was used anesthetize skin and subcutaneous tissue targeting the area between L5 and S1 foramina, S1 and S2 foramina, as well as S2 and S3 foramina.  A 17-gauge 100 mm insulated straight RF needle with 100mm active tip was utilized and inserted at the anesthetized areas.  Needle was angled superiorly targeting the S1 SAP/sacral ala junction.  Bone contact was made and confirmed on lateral imaging.  Another 17-gauge 100 mm introducer probe was inserted between S1 and S2  lateral to the foramina.  This was angled superiorly targeting the superior lateral aspect of the S1 foramen.  Another 17-gauge 100 mm radiofrequency needle was inserted between S2 and S3,  lateral to the foramina.  This was angled superiorly targeting the superior lateral aspect of the S2 foramen.  AP and lateral images were utilized to achieve proper needle location.  Motor stim at 2 Hz confirmed proper location.  RF probes were inserted into the needles.  1 mL of 2% lidocaine was injected through each needle and radiofrequency lesioning at 80 C x 2.5 minutes was  performed.  RF probes were removed.  The same needles were then withdrawn to the subcu level and redirected inferiorly targeting the inferior lateral aspect of the sacral foramina 1,2,3.  Bone contact was made and confirmed with lateral imaging.  Motor stim at 2 Hz confirmed proper needle location. RF probes were inserted into the needles.  1 mL of 2% lidocaine was injected through each needle and radiofrequency lesioning at 80 C x 2.5 minutes was performed.  Needles and probes were removed.  Bandage was applied.  Patient tolerated procedure well

## 2019-11-10 NOTE — Patient Instructions (Signed)

## 2019-11-13 ENCOUNTER — Other Ambulatory Visit: Payer: Self-pay | Admitting: Family Medicine

## 2019-11-23 ENCOUNTER — Encounter (HOSPITAL_COMMUNITY): Payer: Self-pay | Admitting: Emergency Medicine

## 2019-11-23 ENCOUNTER — Ambulatory Visit (HOSPITAL_COMMUNITY)
Admission: EM | Admit: 2019-11-23 | Discharge: 2019-11-23 | Disposition: A | Discharge status: Home / Self Care | Payer: 59 | Attending: Family Medicine | Admitting: Family Medicine

## 2019-11-23 ENCOUNTER — Ambulatory Visit (INDEPENDENT_AMBULATORY_CARE_PROVIDER_SITE_OTHER): Payer: 59

## 2019-11-23 ENCOUNTER — Other Ambulatory Visit: Payer: Self-pay

## 2019-11-23 DIAGNOSIS — S8001XA Contusion of right knee, initial encounter: Secondary | ICD-10-CM

## 2019-11-23 DIAGNOSIS — M25561 Pain in right knee: Secondary | ICD-10-CM | POA: Diagnosis not present

## 2019-11-23 DIAGNOSIS — M25469 Effusion, unspecified knee: Secondary | ICD-10-CM

## 2019-11-23 DIAGNOSIS — S8002XA Contusion of left knee, initial encounter: Secondary | ICD-10-CM | POA: Diagnosis not present

## 2019-11-23 DIAGNOSIS — S8011XA Contusion of right lower leg, initial encounter: Secondary | ICD-10-CM

## 2019-11-23 DIAGNOSIS — M25461 Effusion, right knee: Secondary | ICD-10-CM

## 2019-11-23 DIAGNOSIS — W109XXA Fall (on) (from) unspecified stairs and steps, initial encounter: Secondary | ICD-10-CM

## 2019-11-23 DIAGNOSIS — Y92009 Unspecified place in unspecified non-institutional (private) residence as the place of occurrence of the external cause: Secondary | ICD-10-CM

## 2019-11-23 DIAGNOSIS — W19XXXA Unspecified fall, initial encounter: Secondary | ICD-10-CM

## 2019-11-23 MED ORDER — KETOROLAC TROMETHAMINE 60 MG/2ML IM SOLN
INTRAMUSCULAR | Status: AC
  Filled 2019-11-23: qty 2

## 2019-11-23 MED ORDER — KETOROLAC TROMETHAMINE 60 MG/2ML IM SOLN
60.0000 mg | Freq: Once | INTRAMUSCULAR | Status: AC
  Administered 2019-11-23: 60 mg via INTRAMUSCULAR

## 2019-11-23 NOTE — Discharge Instructions (Addendum)
You were given a shot of Toradol 60mg  today to help with pain and swelling. Recommend start OTC Aleve 2 tablets twice a day as needed for pain. Keep right leg elevated as much as possible and wear knee brace during the day- may remove at night. Call [facility] tomorrow to schedule appointment for further evaluation. Follow-up with Orthopedic as planned or go to the ER if pain worsens.

## 2019-11-23 NOTE — ED Provider Notes (Signed)
Martinez    CSN: [id number] Arrival date & time: 11/23/19  1823      History   Chief Complaint Chief Complaint  Patient presents with  . Fall  . Knee Pain    HPI Maria Powers is a 44 y.o. female.   44 year old female presents with right knee pain. She fell at home twice last week. The first time she slid down the stairs and fell on both of her knees. A few days later, she tripped and fell again, landing on both knees. She felt immediate pain in her right knee and noticed bruising a few days later in both knees. Her left knee is bruised but not painful. Her right knee has been painful, mainly in the medial aspect below her patella but occasional sharp, stabbing pain lateral to her patella. She has significant bruising below her right patella which is now spreading down her leg and the pain continues to get worse. She can bear slight weight on her right knee but is extremely painful. She has applied ice, taken Tylenol, Advil and Aspirin for pain with no relief. She is also on Neurontin, Flexeril and Tramadol for chronic back pain which has not helped with this current knee pain. She did have 2 surgeries on her right knee (arthroscopy). The last surgery was in 2007 (in [place]) in which metal hardware was placed for reconstruction. She does not have a local Orthopedic. Other chronic health issues include HTN and currently takes Norvasc daily. [facility] but he is out of town and no one can see her in his office until March 18th. She is concerned about her injury and worries about potential damage to her hardware in her knee.   The history is provided by the patient.  Fall This is a new problem. The current episode started more than 1 week ago. The problem occurs constantly. The problem has been gradually worsening. Associated symptoms include headaches. Pertinent negatives include no shortness of breath.  The symptoms are aggravated by bending, twisting, walking and standing. Nothing relieves the symptoms. She has tried acetaminophen, ASA, a cold compress and rest for the symptoms.    Past Medical History:  Diagnosis Date  . Anxiety   . Bipolar 1 disorder ([place])   . Chronic pain    back, hip - seeing PMR  . Depression   . Dysmenorrhea   . Hypertension   . Obesity     Patient Active Problem List   Diagnosis Date Noted  . BMI 40.0-44.9, adult ([place]) 10/16/2015  . Patellofemoral arthritis of right knee 09/02/2015  . Sacroiliac dysfunction 08/14/2014  . Muscle pain, myofascial 08/14/2014  . Essential hypertension, benign 04/30/2014  . Menorrhagia 04/18/2014  . Dysmenorrhea 04/18/2014  . Migraine headache 07/10/2013  . Low back pain radiating to right leg 05/18/2012    Past Surgical History:  Procedure Laterality Date  . KIDNEY STONE SURGERY  2007  . KNEE ARTHROSCOPY Right 2003  . KNEE ARTHROSCOPY Right 2007  . SHOULDER ARTHROSCOPY Right 2007  . WISDOM TOOTH EXTRACTION  Highschool    OB History    Gravida  1   Para  1   Term  1   Preterm      AB      Living  1     SAB      TAB      Ectopic      Multiple  Live Births  1            Home Medications    Prior to Admission medications   Medication Sig Start Date End Date Taking? Authorizing Provider  amLODipine (NORVASC) 5 MG tablet TAKE 1 TABLET BY MOUTH EVERY DAY 11/13/19   Kriste Basque R, DO  cyclobenzaprine (FLEXERIL) 10 MG tablet TAKE 1 TABLET BY MOUTH THREE TIMES DAILY 06/12/19   Kirsteins, Victorino Sparrow, MD  gabapentin (NEURONTIN) 300 MG capsule TAKE 1 CAPSULE(300 MG) BY MOUTH TWICE DAILY 08/15/19   Kirsteins, Victorino Sparrow, MD  levonorgestrel (MIRENA) 20 MCG/24HR IUD 1 each by Intrauterine route once.    [provider]  rizatriptan (MAXALT-MLT) 5 MG disintegrating tablet Take 1 tablet (5 mg total) by mouth as needed. 07/05/18   Terressa Koyanagi, DO  traMADol (ULTRAM) 50 MG tablet Take 1 tablet (50  mg total) by mouth 4 (four) times daily. 10/13/19   Kirsteins, Victorino Sparrow, MD    Family History Family History  Problem Relation Age of Onset  . Addison's disease Mother   . Diabetes Mother   . Bipolar disorder Mother   . Fibroids Maternal Aunt     Social History Social History   Tobacco Use  . Smoking status: Former Games developer  . Smokeless tobacco: Never Used  Substance Use Topics  . Alcohol use: No    Alcohol/week: 0.0 standard drinks  . Drug use: No     Allergies   Clindamycin/lincomycin and Nubain [nalbuphine hcl]   Review of Systems Review of Systems  Constitutional: Negative for chills and fever.  Respiratory: Negative for chest tightness and shortness of breath.   Musculoskeletal: Positive for arthralgias, back pain (chronic), gait problem and joint swelling.  Skin: Positive for color change. Negative for rash and wound.  Allergic/Immunologic: Negative for food allergies and immunocompromised state.  Neurological: Positive for headaches. Negative for seizures, weakness and numbness.     Physical Exam Triage Vital Signs ED Triage Vitals  Enc Vitals Group     BP 11/23/19 1855 (!) 149/94     Pulse Rate 11/23/19 1855 100     Resp 11/23/19 1855 16     Temp 11/23/19 1855 98.4 F (36.9 C)     Temp Source 11/23/19 1855 Oral     SpO2 11/23/19 1855 100 %     Weight --      Height --      Head Circumference --      Peak Flow --      Pain Score 11/23/19 1903 5     Pain Loc --      Pain Edu? --      Excl. in GC? --    No data found.  Updated Vital Signs BP (!) 149/94 (BP Location: Left Arm)   Pulse 100   Temp 98.4 F (36.9 C) (Oral)   Resp 16   SpO2 100%   Visual Acuity Right Eye Distance:   Left Eye Distance:   Bilateral Distance:    Right Eye Near:   Left Eye Near:    Bilateral Near:     Physical Exam Vitals and nursing note reviewed.  Constitutional:      General: She is awake. She is not in acute distress.    Appearance: She is well-developed  and well-groomed.     Comments: She is sitting in an exam chair in no acute distress but appears in pain especially with any movement of her right knee  HENT:  Head: Normocephalic and atraumatic.  Eyes:     Extraocular Movements: Extraocular movements intact.     Conjunctiva/sclera: Conjunctivae normal.  Cardiovascular:     Rate and Rhythm: Tachycardia present.     Comments: Slight tachycardia Pulmonary:     Effort: Pulmonary effort is normal.  Musculoskeletal:        General: Swelling and tenderness present.     Right knee: Swelling and ecchymosis present. Decreased range of motion. Tenderness present over the medial joint line and lateral joint line. No patellar tendon tenderness. Normal alignment and normal patellar mobility. Normal pulse.     Left knee: Ecchymosis present. No swelling. Normal range of motion. No tenderness. Normal alignment and normal patellar mobility. Normal pulse.       Legs:     Comments: Full range of motion of left knee. Some bruising present below patella and just medial to upper patella. Non-tender. No distinct swelling present. No neuro deficits noted. Good distal pulses.  Decreased range of motion of right knee, particularly with flexion. Most comfortable position is almost fully extended. Some swelling present above and below patella. Bruising present below patella and down upper third of tibia. Very tender, especially medial aspect of right knee. No distinct instability detected. Limited weight-bearing. No distal swelling or numbness. No distinct neuro deficits noted. Good distal pulses.   Skin:    General: Skin is warm and dry.     Capillary Refill: Capillary refill takes less than 2 seconds.     Findings: Bruising (both knees below patella) present. No erythema or rash.  Neurological:     General: No focal deficit present.     Mental Status: She is alert and oriented to person, place, and time.     Sensory: Sensation is intact. No sensory deficit.      Motor: Motor function is intact.     Comments: No neuro deficits in lower legs or feet  Psychiatric:        Mood and Affect: Mood normal.        Behavior: Behavior normal. Behavior is cooperative.        Thought Content: Thought content normal.        Judgment: Judgment normal.      UC Treatments / Results  Labs (all labs ordered are listed, but only abnormal results are displayed) Labs Reviewed - No data to display  EKG   Radiology DG Knee Complete 4 Views Right  Result Date: 11/23/2019 CLINICAL DATA:  Fall with knee strike EXAM: RIGHT KNEE - COMPLETE 4+ VIEW COMPARISON:  Knee radiographs 08/07/2015 FINDINGS: Prepatellar soft tissue swelling. Small lucency along the medial patellar facet likely reflecting changes from prior osteochondral lesion seen on comparison radiography. Small suprapatellar right knee joint effusion. No acute fracture or traumatic malalignment. Mild tricompartmental degenerative changes. Remote postsurgical changes likely from prior tibial tubercle osteotomy with fully threaded cannulated screws and no evidence of acute hardware fracture or acute complication. IMPRESSION: 1. Prepatellar swelling and small suprapatellar right knee joint effusion. No acute osseous abnormality. 2. Likely sequela from prior osteochondral lesion seen on comparison radiograph. 3. Mild tricompartmental degenerative changes. 4. Remote postsurgical changes likely from prior tibial tubercle osteotomy. Electronically Signed   By: Kreg Shropshire M.D.   On: 11/23/2019 19:55    Procedures Procedures (including critical care time)  Medications Ordered in UC Medications  ketorolac (TORADOL) injection 60 mg (60 mg Intramuscular Given 11/23/19 2050)    Initial Impression / Assessment and Plan / UC Course  I have reviewed the triage vital signs and the nursing notes.  Pertinent labs & imaging results that were available during my care of the patient were reviewed by me and considered in my medical  decision making (see chart for details).     Reviewed x-ray results with patient- discussed suprapatellar effusion and degenerative changes. Reviewed that x-ray can not detect ligament or tendon damage and she may need an MRI. No distinct damage to hardware. Gave Toradol 60mg  IM now to help with pain and swelling. She is under a pain medication contract but reviewed that Toradol is an anti-inflammatory, non-controlled medication and that this should not interfere with her pain med contract. May also take OTC Aleve 2 tablets every 12 hours as needed for pain and swelling. Applied right knee brace for support- may take off at night. Elevate leg as much as possible. Note written for work. Recommend call [facility] tomorrow to schedule appointment for further evaluation.  Final Clinical Impressions(s) / UC Diagnoses   Final diagnoses:  Right anterior knee pain  Fall in home, initial encounter  Contusion, knee and lower leg, right, initial encounter  Contusion of left knee, initial encounter  Suprapatellar effusion of knee     Discharge Instructions     You were given a shot of Toradol 60mg  today to help with pain and swelling. Recommend start OTC Aleve 2 tablets twice a day as needed for pain. Keep right leg elevated as much as possible and wear knee brace during the day- may remove at night. Call [facility] tomorrow to schedule appointment for further evaluation. Follow-up with Orthopedic as planned or go to the ER if pain worsens.     ED Prescriptions    None     PDMP not reviewed this encounter.   , NP 11/23/19 2348

## 2019-11-23 NOTE — ED Triage Notes (Signed)
Pt states she fell twice last week, c/o R knee pain. Significant bruising to R knee. Hx of knee surgery in 2007 with metal hardware.

## 2019-12-04 ENCOUNTER — Other Ambulatory Visit: Payer: Self-pay | Admitting: Physical Medicine & Rehabilitation

## 2019-12-07 ENCOUNTER — Ambulatory Visit: Payer: 59 | Admitting: Physical Medicine & Rehabilitation

## 2019-12-11 ENCOUNTER — Other Ambulatory Visit: Payer: Self-pay | Admitting: Family Medicine

## 2019-12-14 ENCOUNTER — Ambulatory Visit: Payer: 59 | Attending: Internal Medicine

## 2019-12-14 DIAGNOSIS — Z23 Encounter for immunization: Secondary | ICD-10-CM

## 2019-12-14 NOTE — Progress Notes (Signed)
   Covid-19 Vaccination Clinic  Name:  Maria Powers    MRN: [id number] DOB: 1976-05-22  12/14/2019  Maria Powers was observed post Covid-19 immunization for 15 minutes without incident. Maria Powers was provided with Vaccine Information Sheet and instruction to access the V-Safe system.   Maria Powers was instructed to call 911 with any severe reactions post vaccine: Marland Kitchen Difficulty breathing  . Swelling of face and throat  . A fast heartbeat  . A bad rash all over body  . Dizziness and weakness   Immunizations Administered    Name Date Dose VIS Date Route   Pfizer COVID-19 Vaccine 12/14/2019  9:00 AM 0.3 mL 09/01/2019 Intramuscular   Manufacturer: [employer], [employer]   Lot: [id number]   NDC: [address]-2

## 2020-01-08 ENCOUNTER — Other Ambulatory Visit: Payer: Self-pay | Admitting: Physical Medicine & Rehabilitation

## 2020-01-09 ENCOUNTER — Other Ambulatory Visit: Payer: Self-pay | Admitting: Family Medicine

## 2020-01-09 ENCOUNTER — Ambulatory Visit: Payer: 59 | Attending: Internal Medicine

## 2020-01-09 DIAGNOSIS — Z23 Encounter for immunization: Secondary | ICD-10-CM

## 2020-01-09 NOTE — Progress Notes (Signed)
   Covid-19 Vaccination Clinic  Name:  Maria Powers    MRN: [id number] DOB: 12-21-1975  01/09/2020  [name] was observed post Covid-19 immunization for 15 minutes without incident. She was provided with Vaccine Information Sheet and instruction to access the V-Safe system.   [name] was instructed to call 911 with any severe reactions post vaccine: Marland Kitchen Difficulty breathing  . Swelling of face and throat  . A fast heartbeat  . A bad rash all over body  . Dizziness and weakness   Immunizations Administered    Name Date Dose VIS Date Route   Pfizer COVID-19 Vaccine 01/09/2020  8:57 AM 0.3 mL 11/15/2018 Intramuscular   Manufacturer: [employer], [employer]   Lot: [id number]   NDC: [address]-2

## 2020-01-12 ENCOUNTER — Other Ambulatory Visit: Payer: Self-pay | Admitting: Physical Medicine & Rehabilitation

## 2020-02-02 ENCOUNTER — Encounter: Payer: Self-pay | Admitting: Obstetrics and Gynecology

## 2020-02-06 ENCOUNTER — Other Ambulatory Visit: Payer: Self-pay | Admitting: Family Medicine

## 2020-02-22 ENCOUNTER — Other Ambulatory Visit (HOSPITAL_COMMUNITY)
Admission: RE | Admit: 2020-02-22 | Discharge: 2020-02-22 | Disposition: A | Discharge status: Home / Self Care | Payer: 59 | Source: Ambulatory Visit | Attending: Obstetrics and Gynecology | Admitting: Obstetrics and Gynecology

## 2020-02-22 ENCOUNTER — Ambulatory Visit (INDEPENDENT_AMBULATORY_CARE_PROVIDER_SITE_OTHER): Payer: 59 | Admitting: Obstetrics and Gynecology

## 2020-02-22 ENCOUNTER — Other Ambulatory Visit: Payer: Self-pay

## 2020-02-22 ENCOUNTER — Encounter: Payer: Self-pay | Admitting: Obstetrics and Gynecology

## 2020-02-22 VITALS — BP 128/64 | HR 90 | Temp 97.3°F | Resp 18 | Ht 64.5 in | Wt 238.4 lb

## 2020-02-22 DIAGNOSIS — Z01419 Encounter for gynecological examination (general) (routine) without abnormal findings: Secondary | ICD-10-CM | POA: Diagnosis present

## 2020-02-22 NOTE — Addendum Note (Signed)
Addended by: Ardell Isaacs, Debbe Bales E on: 02/22/2020 05:27 PM   Modules accepted: Orders

## 2020-02-22 NOTE — Patient Instructions (Signed)

## 2020-02-22 NOTE — Progress Notes (Signed)
44 y.o. G31P1001 Single Caucasian female here for annual exam.    Patient began having bleeding on 02-02-20 and at first it was very heavy but has tapered down some. No pain other than cramping for the day of heavy flow.  Prior to the bleeding in May, she would have periodic spotting.  She has really enjoyed her IUD.   EMB done 05/17/15 and this showed benign atrophy endometrial tissue with stromal changes suggesting progesterone effect.   Denies STD testing.   Working at a [employer].   Received her Covid vaccine.   PCP: Kriste Basque, DO    No LMP recorded. (Menstrual status: IUD).     Period Cycle (Days): (has Mirena IUD)     Sexually active: No.  The current method of family planning is IUD--Mirena 06-21-15. Dr.Silva placed   Exercising: No.  The patient does not participate in regular exercise at present. Smoker:  no  Health Maintenance: Pap: 07-12-14 Ascus:neg, 11-24-11 Neg History of abnormal Pap:  No, 2015 has ASCUS pap MMG:  NEVER   Colonoscopy:  n/a BMD:   n/a  Result  n/a TDaP:  07-12-14 Gardasil:   no HIV: Neg in past Hep C: no Screening Labs:  PCP.   reports that she has quit smoking. She has never used smokeless tobacco. She reports that she does not drink alcohol or use drugs.  Past Medical History:  Diagnosis Date  . Anxiety   . Bipolar 1 disorder ([facility])   . Chronic pain    back, hip - seeing PMR  . Depression   . Dysmenorrhea   . Hypertension   . Obesity     Past Surgical History:  Procedure Laterality Date  . KIDNEY STONE SURGERY  2007  . KNEE ARTHROSCOPY Right 2003  . KNEE ARTHROSCOPY Right 2007  . SHOULDER ARTHROSCOPY Right 2007  . WISDOM TOOTH EXTRACTION  Highschool    Current Outpatient Medications  Medication Sig Dispense Refill  . amLODipine (NORVASC) 5 MG tablet TAKE 1 TABLET BY MOUTH EVERY DAY 30 tablet 0  . cyclobenzaprine (FLEXERIL) 10 MG tablet TAKE 1 TABLET BY MOUTH THREE TIMES DAILY 270 tablet 1  . gabapentin (NEURONTIN) 300 MG  capsule TAKE 1 CAPSULE(300 MG) BY MOUTH TWICE DAILY 180 capsule 0  . levonorgestrel (MIRENA) 20 MCG/24HR IUD 1 each by Intrauterine route once.    . Multiple Vitamin (MULTIVITAMIN WITH MINERALS) TABS tablet Take 1 tablet by mouth daily.    . rizatriptan (MAXALT-MLT) 5 MG disintegrating tablet Take 1 tablet (5 mg total) by mouth as needed. 10 tablet 0  . traMADol (ULTRAM) 50 MG tablet TAKE 1 TABLET BY MOUTH FOUR TIMES DAILY 120 tablet 2   No current facility-administered medications for this visit.    Family History  Problem Relation Age of Onset  . Addison's disease Mother   . Diabetes Mother   . Bipolar disorder Mother   . Fibroids Maternal Aunt     Review of Systems  All other systems reviewed and are negative.   Exam:   BP 128/64 (Cuff Size: Large)   Pulse 90   Temp (!) 97.3 F (36.3 C) (Temporal)   Resp 18   Ht 5' 4.5" (1.638 m)   Wt 238 lb 6.4 oz (108.1 kg)   BMI 40.29 kg/m     General appearance: alert, cooperative and appears stated age Head: normocephalic, without obvious abnormality, atraumatic Neck: no adenopathy, supple, symmetrical, trachea midline and thyroid normal to inspection and palpation Lungs: clear to  auscultation bilaterally Breasts: normal appearance, no masses or tenderness, No nipple retraction or dimpling, No nipple discharge or bleeding, No axillary adenopathy Heart: regular rate and rhythm Abdomen: soft, non-tender; no masses, no organomegaly Extremities: extremities normal, atraumatic, no cyanosis or edema Skin: skin color, texture, turgor normal. No rashes or lesions Lymph nodes: cervical, supraclavicular, and axillary nodes normal. Neurologic: grossly normal  Pelvic: External genitalia:  no lesions              No abnormal inguinal nodes palpated.              Urethra:  normal appearing urethra with no masses, tenderness or lesions              Bartholins and Skenes: normal                 Vagina: normal appearing vagina with normal color  and discharge, no lesions              Cervix: no lesions.  IUD strings noted.               Pap taken: Yes.   Bimanual Exam:  Uterus:  normal size, contour, position, consistency, mobility, non-tender              Adnexa: no mass, fullness, tenderness              Rectal exam: Yes.  .  Confirms.              Anus:  normal sphincter tone, no lesions  Chaperone was present for exam.  Assessment:   Well woman visit with normal exam. Mirena IUD.  Hx ASCUS pap and neg HR HPV. Hx migraine HA.   Plan: Mammogram screening discussed.  List of facilities to patient.  She will schedule.  Self breast awareness reviewed. Pap and HR HPV as above. Guidelines for Calcium, Vitamin D, regular exercise program including cardiovascular and weight bearing exercise. Labs with PCP.  We discussed Mirena being approved for 6 years of use.  She will monitor her cycles.  Follow up annually and prn.  After visit summary provided.

## 2020-02-26 LAB — CYTOLOGY - PAP
Comment: NEGATIVE
Diagnosis: NEGATIVE
High risk HPV: NEGATIVE

## 2020-04-09 ENCOUNTER — Other Ambulatory Visit: Payer: Self-pay

## 2020-04-09 ENCOUNTER — Encounter: Payer: Self-pay | Admitting: Physical Medicine & Rehabilitation

## 2020-04-09 ENCOUNTER — Encounter: Payer: 59 | Attending: Physical Medicine & Rehabilitation | Admitting: Physical Medicine & Rehabilitation

## 2020-04-09 VITALS — BP 134/87 | HR 98 | Temp 98.5°F | Ht 65.0 in | Wt 237.0 lb

## 2020-04-09 DIAGNOSIS — G894 Chronic pain syndrome: Secondary | ICD-10-CM | POA: Insufficient documentation

## 2020-04-09 DIAGNOSIS — Z79891 Long term (current) use of opiate analgesic: Secondary | ICD-10-CM | POA: Diagnosis not present

## 2020-04-09 DIAGNOSIS — Z5181 Encounter for therapeutic drug level monitoring: Secondary | ICD-10-CM | POA: Insufficient documentation

## 2020-04-09 MED ORDER — GABAPENTIN 600 MG PO TABS: 300.0000 mg | ORAL_TABLET | Freq: Three times a day (TID) | ORAL | 2 refills | Status: DC

## 2020-04-09 MED ORDER — TRAMADOL HCL 50 MG PO TABS: 50.0000 mg | ORAL_TABLET | Freq: Four times a day (QID) | ORAL | 5 refills | Status: DC

## 2020-04-09 NOTE — Progress Notes (Signed)
Subjective:    Patient ID: Maria Powers, female    DOB: 26-Dec-1975, 44 y.o.   MRN: [id number]  HPI  44 year old female with history of chronic right-sided low back and buttock pain.  She had good relief with sacroiliac intra-articular injections but these only lasted a couple months.  She underwent right L5 dorsal ramus right S1-S2-S3 lateral branch blocks and these produced a good temporary relief of greater than 50%.  She has subsequently undergone sacroiliac RF both standard as well as cooled.  Right Cooled RF of sacroiliac started wearing off July 4 weekend, no new falls or injury Prior standard RF also lasted ~30mo Hello pain Inventory Average Pain 5 Pain Right Now 3 My pain is intermittent, constant, sharp, burning, dull, stabbing, tingling and aching  In the last 24 hours, has pain interfered with the following? General activity varies Relation with others varies Enjoyment of life varies What TIME of day is your pain at its worst? night Sleep (in general) Good  Pain is worse with: walking, bending, standing and some activites Pain improves with: medication Relief from Meds: 6  Mobility walk without assistance ability to climb steps?  yes do you drive?  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  no  Neuro/Psych No problems in this area  Prior Studies Any changes since last visit?  no  Physicians involved in your care Any changes since last visit?  no   Family History  Problem Relation Age of Onset  . Addison's disease Mother   . Diabetes Mother   . Bipolar disorder Mother   . Fibroids Maternal Aunt    Social History   Socioeconomic History  . Marital status: Single    Spouse name: Not on file  . Number of children: Not on file  . Years of education: Not on file  . Highest education level: Not on file  Occupational History  . Not on file  Tobacco Use  . Smoking status: Former  Games developer  . Smokeless tobacco: Never Used  Vaping Use  . Vaping Use: Never used  Substance and Sexual Activity  . Alcohol use: No    Alcohol/week: 0.0 standard drinks  . Drug use: No  . Sexual activity: Not Currently    Partners: Male    Birth control/protection: Abstinence, I.U.D.    Comment: Mirena IUD inserted 06-21-15  Other Topics Concern  . Not on file  Social History Narrative   Work or School: Pharmacist, hospital      Home Situation: roommate      Spiritual Beliefs:mormon      Lifestyle: no regular exercise; horrible diet            Social Determinants of Health   Financial Resource Strain:   . Difficulty of Paying Living Expenses:   Food Insecurity:   . Worried About Programme researcher, broadcasting/film/video in the Last Year:   . Barista in the Last Year:   Transportation Needs:   . Freight forwarder (Medical):   Marland Kitchen Lack of Transportation (Non-Medical):   Physical Activity:   . Days of Exercise per Week:   . Minutes of Exercise per Session:   Stress:   . Feeling of Stress :   Social Connections:   . Frequency of Communication with Friends and Family:   . Frequency of Social Gatherings with Friends and Family:   . Attends Religious  Services:   . Active Member of Clubs or Organizations:   . Attends Banker Meetings:   Marland Kitchen Marital Status:    Past Surgical History:  Procedure Laterality Date  . KIDNEY STONE SURGERY  2007  . KNEE ARTHROSCOPY Right 2003  . KNEE ARTHROSCOPY Right 2007  . SHOULDER ARTHROSCOPY Right 2007  . WISDOM TOOTH EXTRACTION  Highschool   Past Medical History:  Diagnosis Date  . Anxiety   . Bipolar 1 disorder (HCC)   . Chronic pain    back, hip - seeing PMR  . Depression   . Dysmenorrhea   . Hypertension   . Obesity    BP 134/87   Pulse 98   Temp 98.5 F (36.9 C)   Ht 5\' 5"  (1.651 m)   Wt 237 lb (107.5 kg)   SpO2 96%   BMI 39.44 kg/m   Opioid Risk Score:   Fall Risk Score:  `1  Depression screen PHQ  2/9  Depression screen [facility] 2/9 11/21/2018 07/28/2018 02/28/2018 11/15/2017 10/24/2015 06/18/2015 01/03/2015  Decreased Interest 1 3 1  0 1 1 1   Down, Depressed, Hopeless 1 3 1 1 1 1 1   PHQ - 2 Score 2 6 2 1 2 2 2   Altered sleeping - - - - - - 3  Tired, decreased energy - - - - - - 2  Change in appetite - - - - - - 0  Feeling bad or failure about yourself  - - - - - - 1  Trouble concentrating - - - - - - 1  Moving slowly or fidgety/restless - - - - - - 0  Suicidal thoughts - - - - - - 0  PHQ-9 Score - - - - - - 9    Review of Systems  Constitutional: Negative.   HENT: Negative.   Eyes: Negative.   Respiratory: Negative.   Cardiovascular: Negative.   Gastrointestinal: Negative.   Endocrine: Negative.   Genitourinary: Negative.   Musculoskeletal: Positive for arthralgias and back pain.  Skin: Negative.   Allergic/Immunologic: Negative.   Neurological: Negative.   Hematological: Negative.   Psychiatric/Behavioral: Negative.   All other systems reviewed and are negative.      Objective:   Physical Exam Vitals and nursing note reviewed.  Constitutional:      Appearance: She is obese.  HENT:     Head: Normocephalic and atraumatic.  Eyes:     Extraocular Movements: Extraocular movements intact.     Conjunctiva/sclera: Conjunctivae normal.     Pupils: Pupils are equal, round, and reactive to light.  Musculoskeletal:        General: Tenderness present.     Comments: Tenderness with sacroiliac compression on the right side at the PSIS area. There is tenderness with lateral hip compression also at the right sacroiliac there is positive 01/05/2015 on the right side,  left side cause some hip discomfort  Skin:    General: Skin is warm and dry.  Neurological:     Mental Status: She is alert and oriented to person, place, and time.  Psychiatric:        Mood and Affect: Mood normal.        Behavior: Behavior normal.        Thought Content: Thought content normal.        Judgment:  Judgment normal.           Assessment & Plan:  #1.  Chronic right sacroiliac pain she gets good relief  with sacroiliac RF certainly longer than with intra-articular injections.  He cooled RF did not afford any longer duration of relief than the standard RF.  Therefore we will repeat standard RF on or after May 09, 2020 She will continue taking gabapentin based on insurance we will switch to the 600 mg tablets and take 1/2 tablet 3 times per day Continue tramadol 50 mg 4 times daily may be able to back off of this once her sacroiliac RF is performed again Continue Flexeril 10 mg 3 times daily as needed

## 2020-04-11 ENCOUNTER — Other Ambulatory Visit: Payer: Self-pay | Admitting: Physical Medicine & Rehabilitation

## 2020-04-11 LAB — TOXASSURE SELECT,+ANTIDEPR,UR

## 2020-04-16 ENCOUNTER — Telehealth: Payer: Self-pay | Admitting: *Deleted

## 2020-04-16 NOTE — Telephone Encounter (Signed)
Urine drug screen for this encounter is consistent for prescribed medication 

## 2020-05-16 ENCOUNTER — Encounter: Payer: 59 | Attending: Physical Medicine & Rehabilitation | Admitting: Physical Medicine & Rehabilitation

## 2020-05-16 ENCOUNTER — Other Ambulatory Visit: Payer: Self-pay

## 2020-05-16 ENCOUNTER — Encounter: Payer: Self-pay | Admitting: Physical Medicine & Rehabilitation

## 2020-05-16 VITALS — BP 146/92 | HR 91 | Temp 98.3°F | Ht 65.0 in | Wt 241.0 lb

## 2020-05-16 DIAGNOSIS — G8929 Other chronic pain: Secondary | ICD-10-CM

## 2020-05-16 DIAGNOSIS — Z5181 Encounter for therapeutic drug level monitoring: Secondary | ICD-10-CM | POA: Diagnosis present

## 2020-05-16 DIAGNOSIS — G894 Chronic pain syndrome: Secondary | ICD-10-CM | POA: Diagnosis not present

## 2020-05-16 DIAGNOSIS — M533 Sacrococcygeal disorders, not elsewhere classified: Secondary | ICD-10-CM

## 2020-05-16 DIAGNOSIS — Z79891 Long term (current) use of opiate analgesic: Secondary | ICD-10-CM | POA: Diagnosis present

## 2020-05-16 MED ORDER — CYCLOBENZAPRINE HCL 10 MG PO TABS: 10.0000 mg | ORAL_TABLET | Freq: Three times a day (TID) | ORAL | 1 refills | Status: DC

## 2020-05-16 NOTE — Progress Notes (Signed)
  PROCEDURE RECORD [facility]   Name: Maria Powers DOB:December 10, 1975 MRN: [id number]  Date:05/16/2020  Physician: Claudette Laws, MD    Nurse/CMA: Nedra Hai, CMA  Allergies:  Allergies  Allergen Reactions  . Clindamycin/Lincomycin Nausea And Vomiting  . Nubain [Nalbuphine Hcl] Other (See Comments)    *feels like something crawling on her*    Consent Signed: Yes.    Is patient diabetic? No.  CBG today?   Pregnant: No. LMP: No LMP recorded. (Menstrual status: IUD). (age 29-55)  Anticoagulants: no Anti-inflammatory: no Antibiotics: no  Procedure:  Position: Prone Start Time: 1:02 pm End Time:1:17pm  Fluoro Time: 32  RN/CMA Lee,CMA Lee, CMA    Time 12:48pm 1:20pm    BP 146/92 151/101    Pulse 91 85    Respirations 16 16    O2 Sat 97 96    S/S 6 6    Pain Level 6/10 8/10     D/C home with aunt , patient A & O X 3, D/C instructions reviewed, and sits independently.        Right Sacroiliac radio frequency ablation under fluoroscopic guidance This consists of L5 dorsal ramus radio frequency ablation plus RFA of S1-S2 -S3 dorsal rami good relief for >56mo after last sacroiliac RFA performed in Nov 2019  Indication is sacroiliac pain which has improved temporarily on 2 or more occasions by at least 50% following sacroiliac intra-articular injection under fluoroscopic guidance. Pain interferes with self-care and mobility and has failed to respond to conservative measures.  Informed consent was obtained after discussing risks and benefits of the procedure with the patient these include bleeding bruising and infection temporary or permanent paralysis. The patient elects to proceed and has given written consent.  Patient placed prone on fluoroscopy table. Area marked and prepped with Betadine. Fluoroscopic images utilized to guide needle. 25-gauge 1.5 inch needle was used to anesthetize 4 injection points with 2 cc of 1% lidocaine each. Then a  18-gauge 10 cm RF needle with a 10 mm curved active tip was inserted under fluoroscopic guidance targeting the S1 SA P./sacral ala junction, bone contact made and confirmed with lateral imaging.  motor stimulation at 2 Hz confirmed proper needle location followed by injection of one cc of a solution  1% lidocaine MPF. Then the inferolateral aspect of the S1, S2 and lateral aspect of S3 sacral foramina were targeted. Bone contact made. motor stim at 2 Hz confirm proper needle location. One ML of the 1% MPF lidocaine solution was injected into each of 3 sites and radio frequency ablation 80C for 90 seconds was performed. Patient tolerated procedure well. Post procedure instructions given

## 2020-05-16 NOTE — Patient Instructions (Signed)
You had a radio frequency procedure today This was done to alleviate joint pain in your lumbar area We injected lidocaine which is a local anesthetic.  You may experience soreness at the injection sites. You may also experienced some irritation of the nerves that were heated I'm recommending ice for 30 minutes every 2 hours as needed for the next 24-48 hours   

## 2020-05-26 ENCOUNTER — Other Ambulatory Visit: Payer: Self-pay | Admitting: Physical Medicine & Rehabilitation

## 2020-06-05 ENCOUNTER — Other Ambulatory Visit: Payer: Self-pay | Admitting: Physical Medicine & Rehabilitation

## 2020-09-10 ENCOUNTER — Other Ambulatory Visit: Payer: Self-pay

## 2020-09-10 ENCOUNTER — Encounter: Payer: Self-pay | Admitting: Family Medicine

## 2020-09-10 ENCOUNTER — Ambulatory Visit (INDEPENDENT_AMBULATORY_CARE_PROVIDER_SITE_OTHER): Payer: 59 | Admitting: Family Medicine

## 2020-09-10 VITALS — BP 138/82 | HR 95 | Temp 98.8°F | Resp 16 | Ht 65.0 in | Wt 237.2 lb

## 2020-09-10 DIAGNOSIS — I10 Essential (primary) hypertension: Secondary | ICD-10-CM | POA: Diagnosis not present

## 2020-09-10 DIAGNOSIS — R002 Palpitations: Secondary | ICD-10-CM | POA: Diagnosis not present

## 2020-09-10 DIAGNOSIS — Z6839 Body mass index (BMI) 39.0-39.9, adult: Secondary | ICD-10-CM

## 2020-09-10 DIAGNOSIS — R7303 Prediabetes: Secondary | ICD-10-CM | POA: Diagnosis not present

## 2020-09-10 NOTE — Assessment & Plan Note (Signed)
We discussed benefits of wt loss as well as adverse effects of obesity. 10-15 min of daily walking is a good option.

## 2020-09-10 NOTE — Assessment & Plan Note (Signed)
Healthier life style for primary prevention is recommended. Wt loss will help.

## 2020-09-10 NOTE — Patient Instructions (Signed)
A few things to remember from today's visit:   Palpitations - Plan: EKG 12-Lead, BASIC METABOLIC PANEL WITH GFR, TSH, CBC  Class 2 severe obesity with serious comorbidity and body mass index (BMI) of 39.0 to 39.9 in adult, unspecified obesity type (HCC)  Essential hypertension, benign - Plan: BASIC METABOLIC PANEL WITH GFR  Prediabetes - Plan: Hemoglobin A1c  If you need refills please call your pharmacy. Do not use My Chart to request refills or for acute issues that need immediate attention.    DASH Eating Plan DASH stands for "Dietary Approaches to Stop Hypertension." The DASH eating plan is a healthy eating plan that has been shown to reduce high blood pressure (hypertension). It may also reduce your risk for type 2 diabetes, heart disease, and stroke. The DASH eating plan may also help with weight loss. What are tips for following this plan?  General guidelines  Avoid eating more than 2,300 mg (milligrams) of salt (sodium) a day. If you have hypertension, you may need to reduce your sodium intake to 1,500 mg a day.  Limit alcohol intake to no more than 1 drink a day for nonpregnant women and 2 drinks a day for men. One drink equals 12 oz of beer, 5 oz of wine, or 1 oz of hard liquor.  Work with your health care provider to maintain a healthy body weight or to lose weight. Ask what an ideal weight is for you.  Get at least 30 minutes of exercise that causes your heart to beat faster (aerobic exercise) most days of the week. Activities may include walking, swimming, or biking.  Work with your health care provider or diet and nutrition specialist (dietitian) to adjust your eating plan to your individual calorie needs. Reading food labels   Check food labels for the amount of sodium per serving. Choose foods with less than 5 percent of the Daily Value of sodium. Generally, foods with less than 300 mg of sodium per serving fit into this eating plan.  To find whole grains, look for  the word "whole" as the first word in the ingredient list. Shopping  Buy products labeled as "low-sodium" or "no salt added."  Buy fresh foods. Avoid canned foods and premade or frozen meals. Cooking  Avoid adding salt when cooking. Use salt-free seasonings or herbs instead of table salt or sea salt. Check with your health care provider or pharmacist before using salt substitutes.  Do not fry foods. Cook foods using healthy methods such as baking, boiling, grilling, and broiling instead.  Cook with heart-healthy oils, such as olive, canola, soybean, or sunflower oil. Meal planning  Eat a balanced diet that includes: ? 5 or more servings of fruits and vegetables each day. At each meal, try to fill half of your plate with fruits and vegetables. ? Up to 6-8 servings of whole grains each day. ? Less than 6 oz of lean meat, poultry, or fish each day. A 3-oz serving of meat is about the same size as a deck of cards. One egg equals 1 oz. ? 2 servings of low-fat dairy each day. ? A serving of nuts, seeds, or beans 5 times each week. ? Heart-healthy fats. Healthy fats called Omega-3 fatty acids are found in foods such as flaxseeds and coldwater fish, like sardines, salmon, and mackerel.  Limit how much you eat of the following: ? Canned or prepackaged foods. ? Food that is high in trans fat, such as fried foods. ? Food that is high  in saturated fat, such as fatty meat. ? Sweets, desserts, sugary drinks, and other foods with added sugar. ? Full-fat dairy products.  Do not salt foods before eating.  Try to eat at least 2 vegetarian meals each week.  Eat more home-cooked food and less restaurant, buffet, and fast food.  When eating at a restaurant, ask that your food be prepared with less salt or no salt, if possible. What foods are recommended? The items listed may not be a complete list. Talk with your dietitian about what dietary choices are best for you. Grains Whole-grain or  whole-wheat bread. Whole-grain or whole-wheat pasta. Brown rice. Maria Powers. Bulgur. Whole-grain and low-sodium cereals. Pita bread. Low-fat, low-sodium crackers. Whole-wheat flour tortillas. Vegetables Fresh or frozen vegetables (raw, steamed, roasted, or grilled). Low-sodium or reduced-sodium tomato and vegetable juice. Low-sodium or reduced-sodium tomato sauce and tomato paste. Low-sodium or reduced-sodium canned vegetables. Fruits All fresh, dried, or frozen fruit. Canned fruit in natural juice (without added sugar). Meat and other protein foods Skinless chicken or [place]. Ground chicken or [place]. Pork with fat trimmed off. Fish and seafood. Egg whites. Dried beans, peas, or lentils. Unsalted nuts, nut butters, and seeds. Unsalted canned beans. Lean cuts of beef with fat trimmed off. Low-sodium, lean deli meat. Dairy Low-fat (1%) or fat-free (skim) milk. Fat-free, low-fat, or reduced-fat cheeses. Nonfat, low-sodium ricotta or cottage cheese. Low-fat or nonfat yogurt. Low-fat, low-sodium cheese. Fats and oils Soft margarine without trans fats. Vegetable oil. Low-fat, reduced-fat, or light mayonnaise and salad dressings (reduced-sodium). Canola, safflower, olive, soybean, and sunflower oils. Avocado. Seasoning and other foods Herbs. Spices. Seasoning mixes without salt. Unsalted popcorn and pretzels. Fat-free sweets. What foods are not recommended? The items listed may not be a complete list. Talk with your dietitian about what dietary choices are best for you. Grains Baked goods made with fat, such as croissants, muffins, or some breads. Dry pasta or rice meal packs. Vegetables Creamed or fried vegetables. Vegetables in a cheese sauce. Regular canned vegetables (not low-sodium or reduced-sodium). Regular canned tomato sauce and paste (not low-sodium or reduced-sodium). Regular tomato and vegetable juice (not low-sodium or reduced-sodium). Maria Powers. Olives. Fruits Canned fruit in a light  or heavy syrup. Fried fruit. Fruit in cream or butter sauce. Meat and other protein foods Fatty cuts of meat. Ribs. Fried meat. Maria Powers. Sausage. Bologna and other processed lunch meats. Salami. Fatback. Hotdogs. Bratwurst. Salted nuts and seeds. Canned beans with added salt. Canned or smoked fish. Whole eggs or egg yolks. Chicken or [place] with skin. Dairy Whole or 2% milk, cream, and half-and-half. Whole or full-fat cream cheese. Whole-fat or sweetened yogurt. Full-fat cheese. Nondairy creamers. Whipped toppings. Processed cheese and cheese spreads. Fats and oils Butter. Stick margarine. Lard. Shortening. Ghee. Bacon fat. Tropical oils, such as coconut, palm kernel, or palm oil. Seasoning and other foods Salted popcorn and pretzels. Onion salt, garlic salt, seasoned salt, table salt, and sea salt. Worcestershire sauce. Tartar sauce. Barbecue sauce. Teriyaki sauce. Soy sauce, including reduced-sodium. Steak sauce. Canned and packaged gravies. Fish sauce. Oyster sauce. Cocktail sauce. Horseradish that you find on the shelf. Ketchup. Mustard. Meat flavorings and tenderizers. Bouillon cubes. Hot sauce and Tabasco sauce. Premade or packaged marinades. Premade or packaged taco seasonings. Relishes. Regular salad dressings. Where to find more information:  National Heart, Lung, and [place]: https://wilson-eaton.com/  American Heart Association: www.heart.org Summary  The DASH eating plan is a healthy eating plan that has been shown to reduce high blood pressure (hypertension). It may  also reduce your risk for type 2 diabetes, heart disease, and stroke.  With the DASH eating plan, you should limit salt (sodium) intake to 2,300 mg a day. If you have hypertension, you may need to reduce your sodium intake to 1,500 mg a day.  When on the DASH eating plan, aim to eat more fresh fruits and vegetables, whole grains, lean proteins, low-fat dairy, and heart-healthy fats.  Work with your health care provider or  diet and nutrition specialist (dietitian) to adjust your eating plan to your individual calorie needs. This information is not intended to replace advice given to you by your health care provider. Make sure you discuss any questions you have with your health care provider. Document Revised: 08/20/2017 Document Reviewed: 08/31/2016 Elsevier Patient Education  2020 [employer].   Please be sure medication list is accurate. If a new problem present, please set up appointment sooner than planned today.

## 2020-09-10 NOTE — Assessment & Plan Note (Signed)
BP otherwise adequate, I would like closer to 130/80. For now continue non pharmacologic treatment. Low salt diet. Monitor BP at home.

## 2020-09-10 NOTE — Progress Notes (Signed)
HPI: Maria Powers is a 44 y.o. female, who is here today to establish care.  Former PCP: Dr Selena Batten Last preventive routine visit: 10/2017. Gyn exam in 02/2020.  Chronic medical problems: Chronic back pain, prediabetes,HTN, bipolar disorder,and obesity.  Chronic back pain: She follows with Dr Doroteo Bradford, she is on Tramadol and Gabapentin.  HTN: She has not taken Amlodipine 5 mg for 6 months. Negative for severe/frequent headache, visual changes, chest pain, dyspnea, focal weakness, or edema.  She does not exercise regularly and does follow a healthful diet. Breakfast: Cereal with almond milk daily, frozen meals for dinners. She does not cook much.  FHx for DM II,adisson's (mother,died 01-23-08 in her 67's).  Heartburn "sometimes." Negative for abdominal pain, nausea, vomiting, changes in bowel habits, blood in stool or melena.  Palpitation: A couple times 2 weeks ago, no associated symptom. She has not identified exacerbating or alleviating factors.  Bipolar disorder: She is not following with psychiatrist, she did not like last provider she saw. She has not been on medications for a while. She sleeps "allright", better.  Prediabetes: Negative for polydipsia,polyuria, or polyphagia. HgA1C 5.7 in 09/2017.  Review of Systems  Constitutional: Positive for fatigue. Negative for activity change, appetite change and fever.  HENT: Negative for mouth sores, nosebleeds and sore throat.   Respiratory: Negative for cough and wheezing.   Genitourinary: Negative for decreased urine volume and hematuria.  Neurological: Negative for syncope, facial asymmetry and weakness.  Psychiatric/Behavioral: Negative for confusion.  Rest see pertinent positives and negatives per HPI.  Current Outpatient Medications on File Prior to Visit  Medication Sig Dispense Refill  . amLODipine (NORVASC) 5 MG tablet TAKE 1 TABLET BY MOUTH EVERY DAY (Patient not taking: Reported on 05/16/2020) 30 tablet 0  .  cyclobenzaprine (FLEXERIL) 10 MG tablet Take 1 tablet (10 mg total) by mouth 3 (three) times daily. 270 tablet 1  . gabapentin (NEURONTIN) 600 MG tablet TAKE 1/2 TABLET(300 MG) BY MOUTH THREE TIMES DAILY 45 tablet 2  . levonorgestrel (MIRENA) 20 MCG/24HR IUD 1 each by Intrauterine route once.    . Multiple Vitamin (MULTIVITAMIN WITH MINERALS) TABS tablet Take 1 tablet by mouth daily.    . rizatriptan (MAXALT-MLT) 5 MG disintegrating tablet Take 1 tablet (5 mg total) by mouth as needed. 10 tablet 0  . traMADol (ULTRAM) 50 MG tablet Take 1 tablet (50 mg total) by mouth 4 (four) times daily. 120 tablet 5   No current facility-administered medications on file prior to visit.   Past Medical History:  Diagnosis Date  . Anxiety   . Bipolar 1 disorder (HCC)   . Chronic pain    back, hip - seeing PMR  . Depression   . Dysmenorrhea   . Hypertension   . Obesity    Allergies  Allergen Reactions  . Clindamycin/Lincomycin Nausea And Vomiting  . Nubain [Nalbuphine Hcl] Other (See Comments)    *feels like something crawling on her*    Family History  Problem Relation Age of Onset  . Addison's disease Mother   . Diabetes Mother   . Bipolar disorder Mother   . Fibroids Maternal Aunt     Social History   Socioeconomic History  . Marital status: Single    Spouse name: Not on file  . Number of children: Not on file  . Years of education: Not on file  . Highest education level: Not on file  Occupational History  . Not on file  Tobacco Use  .  Smoking status: Former Games developer  . Smokeless tobacco: Never Used  Vaping Use  . Vaping Use: Never used  Substance and Sexual Activity  . Alcohol use: No    Alcohol/week: 0.0 standard drinks  . Drug use: No  . Sexual activity: Not Currently    Partners: Male    Birth control/protection: Abstinence, I.U.D.    Comment: Mirena IUD inserted 06-21-15  Other Topics Concern  . Not on file  Social History Narrative   Work or School: Brewing technologist      Home Situation: roommate      Spiritual Beliefs:mormon      Lifestyle: no regular exercise; horrible diet            Social Determinants of Health   Financial Resource Strain: Not on file  Food Insecurity: Not on file  Transportation Needs: Not on file  Physical Activity: Not on file  Stress: Not on file  Social Connections: Not on file    Vitals:   09/10/20 0933  BP: 138/82  Pulse: 95  Resp: 16  Temp: 98.8 F (37.1 C)  SpO2: 98%   Body mass index is 39.47 kg/m.  Physical Exam Vitals and nursing note reviewed.  Constitutional:      General: She is not in acute distress.    Appearance: She is well-developed.  HENT:     Head: Normocephalic and atraumatic.     Mouth/Throat:     Mouth: Oropharynx is clear and moist. Mucous membranes are dry.  Eyes:     Conjunctiva/sclera: Conjunctivae normal.     Pupils: Pupils are equal, round, and reactive to light.  Cardiovascular:     Rate and Rhythm: Normal rate and regular rhythm.     Pulses:          Dorsalis pedis pulses are 2+ on the right side and 2+ on the left side.     Heart sounds: Murmur (Soft SEM RUSB) heard.    Pulmonary:     Effort: Pulmonary effort is normal. No respiratory distress.     Breath sounds: Normal breath sounds.  Abdominal:     Palpations: Abdomen is soft. There is no hepatomegaly or mass.     Tenderness: There is no abdominal tenderness.  Musculoskeletal:        General: No edema.  Lymphadenopathy:     Cervical: No cervical adenopathy.  Skin:    General: Skin is warm.     Findings: No erythema or rash.  Neurological:     Mental Status: She is alert and oriented to person, place, and time.     Cranial Nerves: No cranial nerve deficit.     Gait: Gait normal.     Deep Tendon Reflexes: Strength normal.  Psychiatric:        Mood and Affect: Mood and affect normal. Mood is not anxious or depressed.   ASSESSMENT AND PLAN:  [name] was seen today for establish  care.  Diagnoses and all orders for this visit:  Orders Placed This Encounter  Procedures  . BASIC METABOLIC PANEL WITH GFR  . TSH  . Hemoglobin A1c  . CBC  . EKG 12-Lead   Lab Results  Component Value Date   WBC 6.1 09/10/2020   HGB 13.7 09/10/2020   HCT 41.0 09/10/2020   MCV 82.5 09/10/2020   PLT 239 09/10/2020   Lab Results  Component Value Date   CREATININE 0.74 09/10/2020   BUN 11 09/10/2020   NA 141 09/10/2020  K 4.4 09/10/2020   CL 108 09/10/2020   CO2 25 09/10/2020   Lab Results  Component Value Date   TSH 0.48 09/10/2020   Lab Results  Component Value Date   HGBA1C 5.7 (H) 09/10/2020   Palpitations We discussed possible causes. Hx and examination today do not suggest serious process. EKG: SR,normal axis and intervals. No other EKG for comparison. Instructed about warning signs. Further recommendations according to lab results.  Class 2 severe obesity with serious comorbidity and body mass index (BMI) of 39.0 to 39.9 in adult, unspecified obesity type (HCC) We discussed benefits of wt loss as well as adverse effects of obesity. Consistency with healthy diet and physical activity recommended.  Essential hypertension, benign BP otherwise adequately controlled, I would like SBP closer to 130. Continue non pharmacologic treatment for now. Monitor BP at home. Low salt diet.  Prediabetes Healthier life style recommended for primary prevention of diabetes.   Return in about 6 months (around 03/11/2021) for cpe.   Shiah Berhow G. [place], MD  [facility]. [place] [facility].  A few things to remember from today's visit:   Palpitations - Plan: EKG 12-Lead, BASIC METABOLIC PANEL WITH GFR, TSH, CBC  Class 2 severe obesity with serious comorbidity and body mass index (BMI) of 39.0 to 39.9 in adult, unspecified obesity type (HCC)  Essential hypertension, benign - Plan: BASIC METABOLIC PANEL WITH GFR  Prediabetes - Plan: Hemoglobin A1c  If you  need refills please call your pharmacy. Do not use My Chart to request refills or for acute issues that need immediate attention.    DASH Eating Plan DASH stands for "Dietary Approaches to Stop Hypertension." The DASH eating plan is a healthy eating plan that has been shown to reduce high blood pressure (hypertension). It may also reduce your risk for type 2 diabetes, heart disease, and stroke. The DASH eating plan may also help with weight loss. What are tips for following this plan?  General guidelines  Avoid eating more than 2,300 mg (milligrams) of salt (sodium) a day. If you have hypertension, you may need to reduce your sodium intake to 1,500 mg a day.  Limit alcohol intake to no more than 1 drink a day for nonpregnant women and 2 drinks a day for men. One drink equals 12 oz of beer, 5 oz of wine, or 1 oz of hard liquor.  Work with your health care provider to maintain a healthy body weight or to lose weight. Ask what an ideal weight is for you.  Get at least 30 minutes of exercise that causes your heart to beat faster (aerobic exercise) most days of the week. Activities may include walking, swimming, or biking.  Work with your health care provider or diet and nutrition specialist (dietitian) to adjust your eating plan to your individual calorie needs. Reading food labels   Check food labels for the amount of sodium per serving. Choose foods with less than 5 percent of the Daily Value of sodium. Generally, foods with less than 300 mg of sodium per serving fit into this eating plan.  To find whole grains, look for the word "whole" as the first word in the ingredient list. Shopping  Buy products labeled as "low-sodium" or "no salt added."  Buy fresh foods. Avoid canned foods and premade or frozen meals. Cooking  Avoid adding salt when cooking. Use salt-free seasonings or herbs instead of table salt or sea salt. Check with your health care provider or pharmacist before using salt  substitutes.  Do not fry foods. Cook foods using healthy methods such as baking, boiling, grilling, and broiling instead.  Cook with heart-healthy oils, such as olive, canola, soybean, or sunflower oil. Meal planning  Eat a balanced diet that includes: ? 5 or more servings of fruits and vegetables each day. At each meal, try to fill half of your plate with fruits and vegetables. ? Up to 6-8 servings of whole grains each day. ? Less than 6 oz of lean meat, poultry, or fish each day. A 3-oz serving of meat is about the same size as a deck of cards. One egg equals 1 oz. ? 2 servings of low-fat dairy each day. ? A serving of nuts, seeds, or beans 5 times each week. ? Heart-healthy fats. Healthy fats called Omega-3 fatty acids are found in foods such as flaxseeds and coldwater fish, like sardines, salmon, and mackerel.  Limit how much you eat of the following: ? Canned or prepackaged foods. ? Food that is high in trans fat, such as fried foods. ? Food that is high in saturated fat, such as fatty meat. ? Sweets, desserts, sugary drinks, and other foods with added sugar. ? Full-fat dairy products.  Do not salt foods before eating.  Try to eat at least 2 vegetarian meals each week.  Eat more home-cooked food and less restaurant, buffet, and fast food.  When eating at a restaurant, ask that your food be prepared with less salt or no salt, if possible. What foods are recommended? The items listed may not be a complete list. Talk with your dietitian about what dietary choices are best for you. Grains Whole-grain or whole-wheat bread. Whole-grain or whole-wheat pasta. Brown rice. Orpah Cobbatmeal. Quinoa. Bulgur. Whole-grain and low-sodium cereals. Pita bread. Low-fat, low-sodium crackers. Whole-wheat flour tortillas. Vegetables Fresh or frozen vegetables (raw, steamed, roasted, or grilled). Low-sodium or reduced-sodium tomato and vegetable juice. Low-sodium or reduced-sodium tomato sauce and tomato  paste. Low-sodium or reduced-sodium canned vegetables. Fruits All fresh, dried, or frozen fruit. Canned fruit in natural juice (without added sugar). Meat and other protein foods Skinless chicken or [place]. Ground chicken or [place]. Pork with fat trimmed off. Fish and seafood. Egg whites. Dried beans, peas, or lentils. Unsalted nuts, nut butters, and seeds. Unsalted canned beans. Lean cuts of beef with fat trimmed off. Low-sodium, lean deli meat. Dairy Low-fat (1%) or fat-free (skim) milk. Fat-free, low-fat, or reduced-fat cheeses. Nonfat, low-sodium ricotta or cottage cheese. Low-fat or nonfat yogurt. Low-fat, low-sodium cheese. Fats and oils Soft margarine without trans fats. Vegetable oil. Low-fat, reduced-fat, or light mayonnaise and salad dressings (reduced-sodium). Canola, safflower, olive, soybean, and sunflower oils. Avocado. Seasoning and other foods Herbs. Spices. Seasoning mixes without salt. Unsalted popcorn and pretzels. Fat-free sweets. What foods are not recommended? The items listed may not be a complete list. Talk with your dietitian about what dietary choices are best for you. Grains Baked goods made with fat, such as croissants, muffins, or some breads. Dry pasta or rice meal packs. Vegetables Creamed or fried vegetables. Vegetables in a cheese sauce. Regular canned vegetables (not low-sodium or reduced-sodium). Regular canned tomato sauce and paste (not low-sodium or reduced-sodium). Regular tomato and vegetable juice (not low-sodium or reduced-sodium). Rosita FirePickles. Olives. Fruits Canned fruit in a light or heavy syrup. Fried fruit. Fruit in cream or butter sauce. Meat and other protein foods Fatty cuts of meat. Ribs. Fried meat. Tomasa BlaseBacon. Sausage. Bologna and other processed lunch meats. Salami. Fatback. Hotdogs. Bratwurst. Salted nuts and seeds. Canned beans  with added salt. Canned or smoked fish. Whole eggs or egg yolks. Chicken or [place] with skin. Dairy Whole or 2% milk,  cream, and half-and-half. Whole or full-fat cream cheese. Whole-fat or sweetened yogurt. Full-fat cheese. Nondairy creamers. Whipped toppings. Processed cheese and cheese spreads. Fats and oils Butter. Stick margarine. Lard. Shortening. Ghee. Bacon fat. Tropical oils, such as coconut, palm kernel, or palm oil. Seasoning and other foods Salted popcorn and pretzels. Onion salt, garlic salt, seasoned salt, table salt, and sea salt. Worcestershire sauce. Tartar sauce. Barbecue sauce. Teriyaki sauce. Soy sauce, including reduced-sodium. Steak sauce. Canned and packaged gravies. Fish sauce. Oyster sauce. Cocktail sauce. Horseradish that you find on the shelf. Ketchup. Mustard. Meat flavorings and tenderizers. Bouillon cubes. Hot sauce and Tabasco sauce. Premade or packaged marinades. Premade or packaged taco seasonings. Relishes. Regular salad dressings. Where to find more information:  National Heart, Lung, and Blood Institute: PopSteam.is  American Heart Association: www.heart.org Summary  The DASH eating plan is a healthy eating plan that has been shown to reduce high blood pressure (hypertension). It may also reduce your risk for type 2 diabetes, heart disease, and stroke.  With the DASH eating plan, you should limit salt (sodium) intake to 2,300 mg a day. If you have hypertension, you may need to reduce your sodium intake to 1,500 mg a day.  When on the DASH eating plan, aim to eat more fresh fruits and vegetables, whole grains, lean proteins, low-fat dairy, and heart-healthy fats.  Work with your health care provider or diet and nutrition specialist (dietitian) to adjust your eating plan to your individual calorie needs. This information is not intended to replace advice given to you by your health care provider. Make sure you discuss any questions you have with your health care provider. Document Revised: 08/20/2017 Document Reviewed: 08/31/2016 Elsevier Patient Education  2020 [employer].   Please be sure medication list is accurate. If a new problem present, please set up appointment sooner than planned today.

## 2020-09-11 LAB — CBC
HCT: 41 % (ref 35.0–45.0)
Hemoglobin: 13.7 g/dL (ref 11.7–15.5)
MCH: 27.6 pg (ref 27.0–33.0)
MCHC: 33.4 g/dL (ref 32.0–36.0)
MCV: 82.5 fL (ref 80.0–100.0)
MPV: 11.5 fL (ref 7.5–12.5)
Platelets: 239 10*3/uL (ref 140–400)
RBC: 4.97 10*6/uL (ref 3.80–5.10)
RDW: 13 % (ref 11.0–15.0)
WBC: 6.1 10*3/uL (ref 3.8–10.8)

## 2020-09-11 LAB — HEMOGLOBIN A1C
Hgb A1c MFr Bld: 5.7 % of total Hgb — ABNORMAL HIGH (ref <5.7)
Mean Plasma Glucose: 117 mg/dL
eAG (mmol/L): 6.5 mmol/L

## 2020-09-11 LAB — BASIC METABOLIC PANEL WITH GFR
BUN: 11 mg/dL (ref 7–25)
CO2: 25 mmol/L (ref 20–32)
Calcium: 9.2 mg/dL (ref 8.6–10.2)
Chloride: 108 mmol/L (ref 98–110)
Creat: 0.74 mg/dL (ref 0.50–1.10)
GFR, Est African American: 115 mL/min/{1.73_m2} (ref >=60)
GFR, Est Non African American: 99 mL/min/{1.73_m2} (ref >=60)
Glucose, Bld: 99 mg/dL (ref 65–99)
Potassium: 4.4 mmol/L (ref 3.5–5.3)
Sodium: 141 mmol/L (ref 135–146)

## 2020-09-11 LAB — TSH: TSH: 0.48 mIU/L

## 2020-10-10 ENCOUNTER — Other Ambulatory Visit: Payer: Self-pay | Admitting: Physical Medicine & Rehabilitation

## 2020-10-10 ENCOUNTER — Telehealth: Payer: Self-pay | Admitting: *Deleted

## 2020-10-10 NOTE — Telephone Encounter (Signed)
Notified. 

## 2020-10-10 NOTE — Telephone Encounter (Signed)
Maria Powers called because she should have had one refill left on her tramadol Rx but there was an issue with her insurance information and the pharmacy did not get it resubmitted until after the date had expired for the last refill.  SShe is asking for a refill. [facility] sent a refill request to Dr Wynn Banker.)

## 2020-10-10 NOTE — Telephone Encounter (Signed)
I just sent in a 1` month supply with no refills , needs to be seen in office prior to additional refills

## 2020-11-07 ENCOUNTER — Encounter: Payer: 59 | Attending: Physical Medicine & Rehabilitation | Admitting: Physical Medicine & Rehabilitation

## 2020-11-07 ENCOUNTER — Encounter: Payer: Self-pay | Admitting: Physical Medicine & Rehabilitation

## 2020-11-07 ENCOUNTER — Other Ambulatory Visit: Payer: Self-pay

## 2020-11-07 VITALS — BP 164/103 | HR 92 | Temp 98.8°F | Ht 65.0 in | Wt 240.6 lb

## 2020-11-07 DIAGNOSIS — M79671 Pain in right foot: Secondary | ICD-10-CM | POA: Diagnosis present

## 2020-11-07 DIAGNOSIS — M7918 Myalgia, other site: Secondary | ICD-10-CM | POA: Insufficient documentation

## 2020-11-07 DIAGNOSIS — M533 Sacrococcygeal disorders, not elsewhere classified: Secondary | ICD-10-CM | POA: Insufficient documentation

## 2020-11-07 DIAGNOSIS — M545 Low back pain, unspecified: Secondary | ICD-10-CM | POA: Insufficient documentation

## 2020-11-07 DIAGNOSIS — G5601 Carpal tunnel syndrome, right upper limb: Secondary | ICD-10-CM | POA: Insufficient documentation

## 2020-11-07 NOTE — Progress Notes (Addendum)
Subjective:    Patient ID: Maria Powers, female    DOB: [birth year], 45 y.o.   MRN: [id number]  HPI  45 yo female with chronic right sacroiliac pain who returns today for multiple pain complaints Right sacroiliac RF 05/16/2020 The patient indicates that this procedure helps with the deep low back and buttock pain that she has been experiencing for many years. Also has right sided pain that was not relieved by the SI RF, this seems to be a little higher up. Pain started a few months ago without any obvious cause. Pain is worsened at night when she is rolling from side to side and she is sleeping poorly because of this. The patient also has pain in her right wrist. She has no history of trauma to that area. No history of surgery in that area. RIght wrist pain for several month, pain is accompanied by numbness and tingling in the fingers of the right hand excluding the R 5th digit,  No new neck pain.  No weakness in the hand.  Wrist splint at night helps with pain, but does not completely help.  The patient also complains of walking on the outside of her right foot. She has some pain in her right foot. She is wondering if there is any place where she should go for this. Pain Inventory Average Pain 7 Pain Right Now 5 My pain is intermittent, constant, sharp, dull, stabbing and aching  In the last 24 hours, has pain interfered with the following? General activity 7 Relation with others 7 Enjoyment of life 7 What TIME of day is your pain at its worst? evening and night Sleep (in general) Poor  Pain is worse with: walking, bending, sitting, inactivity, standing and some activites Pain improves with: medication Relief from Meds: 3  Family History  Problem Relation Age of Onset  . Addison's disease Mother   . Diabetes Mother   . Bipolar disorder Mother   . Fibroids Maternal Aunt    Social History   Socioeconomic History  . Marital status: Single    Spouse name: Not on file  .  Number of children: Not on file  . Years of education: Not on file  . Highest education level: Not on file  Occupational History  . Not on file  Tobacco Use  . Smoking status: Former Games developer  . Smokeless tobacco: Never Used  Vaping Use  . Vaping Use: Never used  Substance and Sexual Activity  . Alcohol use: No    Alcohol/week: 0.0 standard drinks  . Drug use: No  . Sexual activity: Not Currently    Partners: Male    Birth control/protection: Abstinence, I.U.D.    Comment: Mirena IUD inserted 06-21-15  Other Topics Concern  . Not on file  Social History Narrative   Work or School: Pharmacist, hospital      Home Situation: roommate      Spiritual Beliefs:mormon      Lifestyle: no regular exercise; horrible diet            Social Determinants of Health   Financial Resource Strain: Not on file  Food Insecurity: Not on file  Transportation Needs: Not on file  Physical Activity: Not on file  Stress: Not on file  Social Connections: Not on file   Past Surgical History:  Procedure Laterality Date  . KIDNEY STONE SURGERY  2007  . KNEE ARTHROSCOPY Right 2003  . KNEE ARTHROSCOPY Right 2007  . SHOULDER ARTHROSCOPY Right 2007  .  WISDOM TOOTH EXTRACTION  Highschool   Past Surgical History:  Procedure Laterality Date  . KIDNEY STONE SURGERY  2007  . KNEE ARTHROSCOPY Right 2003  . KNEE ARTHROSCOPY Right 2007  . SHOULDER ARTHROSCOPY Right 2007  . WISDOM TOOTH EXTRACTION  Highschool   Past Medical History:  Diagnosis Date  . Anxiety   . Bipolar 1 disorder (HCC)   . Chronic pain    back, hip - seeing PMR  . Depression   . Dysmenorrhea   . Hypertension   . Obesity    BP (!) 164/103   Pulse 92   Temp 98.8 F (37.1 C)   Ht 5\' 5"  (1.651 m)   Wt 240 lb 9.6 oz (109.1 kg)   SpO2 96%   BMI 40.04 kg/m   Opioid Risk Score:   Fall Risk Score:  `1  Depression screen PHQ 2/9  Depression screen [facility] 2/9 11/21/2018 07/28/2018 02/28/2018 11/15/2017 10/24/2015 06/18/2015  01/03/2015  Decreased Interest 1 3 1  0 1 1 1   Down, Depressed, Hopeless 1 3 1 1 1 1 1   PHQ - 2 Score 2 6 2 1 2 2 2   Altered sleeping - - - - - - 3  Tired, decreased energy - - - - - - 2  Change in appetite - - - - - - 0  Feeling bad or failure about yourself  - - - - - - 1  Trouble concentrating - - - - - - 1  Moving slowly or fidgety/restless - - - - - - 0  Suicidal thoughts - - - - - - 0  PHQ-9 Score - - - - - - 9    Review of Systems  Musculoskeletal:       Hip and leg pain  All other systems reviewed and are negative.      Objective:   Physical Exam Vitals and nursing note reviewed.  Constitutional:      Appearance: She is obese.  HENT:     Head: Normocephalic and atraumatic.  Eyes:     Extraocular Movements: Extraocular movements intact.     Conjunctiva/sclera: Conjunctivae normal.     Pupils: Pupils are equal, round, and reactive to light.  Skin:    General: Skin is warm and dry.  Neurological:     Mental Status: She is alert and oriented to person, place, and time. Mental status is at baseline.     Gait: Gait abnormal.     Comments: Tends to walk on outside of right foot during ambulation stance phase  Motor strength is 5/5 bilateral deltoid bicep tricep grip There is no evidence of atrophy in the thenar eminence on the right side. No hand intrinsic atrophy There is mild reduction in light touch in the digits 1 through 4 on the right hand Positive reverse Phalen's on the right Negative Tinel's at the wrist on the right.  Psychiatric:        Mood and Affect: Mood normal.    Sacral thrust (prone) : Positive Lateral compression: Positive on right FABER's: Negative distraction (supine): Negative Thigh thrust test: Positive on right         Assessment & Plan:  #1. Chronic right sacroiliac pain increasing pain once again approximately 6 months post RF we will schedule for repeat next month.  2. Right lumbar pain appears to be cranial to the sacroiliac pain.  Will check x-rays of the lumbar spine to see if she is developing some spondylosis. There also appears to  be a prior myofascial component and we will do trigger point injection to the right lumbar paraspinal today at L4 and L5 3. Right wrist pain and finger numbness suspect carpal tunnel syndrome continue splinting, may need EMG if this persists 4. Right foot pain appears to be mainly in the right medial foot will refer to podiatry for evaluation  Trigger Point Injection  Indication: Lumbar myofascial pain not relieved by medication management and other conservative care.  Informed consent was obtained after describing risk and benefits of the procedure with the patient, this includes bleeding, bruising, infection and medication side effects.  The patient wishes to proceed and has given written consent.  The patient was placed in a supine position.  The right L4 and right L5 paraspinal area was marked and prepped with Betadine.  It was entered with a 25-gauge 1-1/2 inch needle and 1 mL of 1% lidocaine was injected into each of 2 trigger points, after negative draw back for blood.  The patient tolerated the procedure well.  Post procedure instructions were given.

## 2020-11-07 NOTE — Patient Instructions (Signed)
Carpal Tunnel Syndrome  Carpal tunnel syndrome is a condition that causes pain, weakness, and numbness in your hand and arm. Numbness is when you cannot feel an area in your body. The carpal tunnel is a narrow area that is on the palm side of your wrist. Repeated wrist motion or certain diseases may cause swelling in the tunnel. This swelling can pinch the main nerve in the wrist. This nerve is called the median nerve. What are the causes? This condition may be caused by:  Moving your hand and wrist over and over again while doing a task.  Injury to the wrist.  Arthritis.  A sac of fluid (cyst) or abnormal growth (tumor) in the carpal tunnel.  Fluid buildup during pregnancy.  Use of tools that vibrate. Sometimes the cause is not known. What increases the risk? The following factors may make you more likely to have this condition:  Having a job that makes you do these things: ? Move your hand over and over again. ? Work with tools that vibrate, such as drills or sanders.  Being a woman.  Having diabetes, obesity, thyroid problems, or kidney failure. What are the signs or symptoms? Symptoms of this condition include:  A tingling feeling in your fingers.  Tingling or loss of feeling in your hand.  Pain in your entire arm. This pain may get worse when you bend your wrist and elbow for a long time.  Pain in your wrist that goes up your arm to your shoulder.  Pain that goes down into your palm or fingers.  Weakness in your hands. You may find it hard to grab and hold items. You may feel worse at night. How is this treated? This condition may be treated with:  Lifestyle changes. You will be asked to stop or change the activity that caused your problem.  Doing exercises and activities that make bones, muscles, and tendons stronger (physical therapy).  Learning how to use your hand again (occupational therapy).  Medicines for pain and swelling. You may have injections in  your wrist.  A wrist splint or brace.  Surgery. Follow these instructions at home: If you have a splint or brace:  Wear the splint or brace as told by your doctor. Take it off only as told by your doctor.  Loosen the splint if your fingers: ? Tingle. ? Become numb. ? Turn cold and blue.  Keep the splint or brace clean.  If the splint or brace is not waterproof: ? Do not let it get wet. ? Cover it with a watertight covering when you take a bath or a shower. Managing pain, stiffness, and swelling If told, put ice on the painful area:  If you have a removable splint or brace, remove it as told by your doctor.  Put ice in a plastic bag.  Place a towel between your skin and the bag.  Leave the ice on for 20 minutes, 2-3 times per day. Do not fall asleep with the cold pack on your skin.  Take off the ice if your skin turns bright red. This is very important. If you cannot feel pain, heat, or cold, you have a greater risk of damage to the area. Move your fingers often to reduce stiffness and swelling.   General instructions  Take over-the-counter and prescription medicines only as told by your doctor.  Rest your wrist from any activity that may cause pain. If needed, talk with your boss at work about changes that can   help your wrist heal.  Do exercises as told by your doctor, physical therapist, or occupational therapist.  Keep all follow-up visits. Contact a doctor if:  You have new symptoms.  Medicine does not help your pain.  Your symptoms get worse. Get help right away if:  You have very bad numbness or tingling in your wrist or hand. Summary  Carpal tunnel syndrome is a condition that causes pain in your hand and arm.  It is often caused by repeated wrist motions.  Lifestyle changes and medicines are used to treat this problem. Surgery may help in very bad cases.  Follow your doctor's instructions about wearing a splint, resting your wrist, keeping follow-up  visits, and calling for help. This information is not intended to replace advice given to you by your health care provider. Make sure you discuss any questions you have with your health care provider. Document Revised: 01/18/2020 Document Reviewed: 01/18/2020 Elsevier Patient Education  2021 Elsevier Inc.  

## 2020-11-09 ENCOUNTER — Other Ambulatory Visit: Payer: Self-pay | Admitting: Physical Medicine & Rehabilitation

## 2020-11-14 ENCOUNTER — Encounter: Payer: Self-pay | Admitting: Podiatry

## 2020-11-14 ENCOUNTER — Ambulatory Visit (INDEPENDENT_AMBULATORY_CARE_PROVIDER_SITE_OTHER): Payer: 59

## 2020-11-14 ENCOUNTER — Ambulatory Visit (INDEPENDENT_AMBULATORY_CARE_PROVIDER_SITE_OTHER): Payer: 59 | Admitting: Podiatry

## 2020-11-14 ENCOUNTER — Other Ambulatory Visit: Payer: Self-pay

## 2020-11-14 DIAGNOSIS — M778 Other enthesopathies, not elsewhere classified: Secondary | ICD-10-CM

## 2020-11-14 DIAGNOSIS — M76822 Posterior tibial tendinitis, left leg: Secondary | ICD-10-CM

## 2020-11-14 MED ORDER — MELOXICAM 15 MG PO TABS
15.0000 mg | ORAL_TABLET | Freq: Every day | ORAL | 3 refills | Status: DC
Start: 2020-11-14 — End: 2021-07-02

## 2020-11-14 MED ORDER — METHYLPREDNISOLONE 4 MG PO TBPK: ORAL_TABLET | ORAL | 0 refills | Status: DC

## 2020-11-14 MED ORDER — TRIAMCINOLONE ACETONIDE 40 MG/ML IJ SUSP
20.0000 mg | Freq: Once | INTRAMUSCULAR | Status: AC
  Administered 2020-11-14: 20 mg

## 2020-11-14 NOTE — Progress Notes (Signed)
Subjective:  Patient ID: Maria Powers, female    DOB: 1976/01/08,  MRN: [id number] HPI Chief Complaint  Patient presents with  . Foot Pain    Medial foot left - aching x couple months, no injury, tried soaking and made worse, takes tramadol for back-no help, Advil or Tylenol PRN  . New Patient (Initial Visit)    45 y.o. female presents with the above complaint.   ROS: Denies fever chills nausea vomiting muscle aches pains calf pain back pain chest pain shortness of breath.  Past Medical History:  Diagnosis Date  . Anxiety   . Bipolar 1 disorder (HCC)   . Chronic pain    back, hip - seeing PMR  . Depression   . Dysmenorrhea   . Hypertension   . Obesity    Past Surgical History:  Procedure Laterality Date  . KIDNEY STONE SURGERY  2007  . KNEE ARTHROSCOPY Right 2003  . KNEE ARTHROSCOPY Right 2007  . SHOULDER ARTHROSCOPY Right 2007  . WISDOM TOOTH EXTRACTION  Highschool    Current Outpatient Medications:  .  meloxicam (MOBIC) 15 MG tablet, Take 1 tablet (15 mg total) by mouth daily., Disp: 30 tablet, Rfl: 3 .  methylPREDNISolone (MEDROL DOSEPAK) 4 MG TBPK tablet, 6 day dose pack - take as directed, Disp: 21 tablet, Rfl: 0 .  traMADol (ULTRAM) 50 MG tablet, TAKE 1 TABLET(50 MG) BY MOUTH FOUR TIMES DAILY, Disp: 120 tablet, Rfl: 5 .  cyclobenzaprine (FLEXERIL) 10 MG tablet, Take 1 tablet (10 mg total) by mouth 3 (three) times daily., Disp: 270 tablet, Rfl: 1 .  gabapentin (NEURONTIN) 600 MG tablet, TAKE 1/2 TABLET(300 MG) BY MOUTH THREE TIMES DAILY, Disp: 45 tablet, Rfl: 2 .  levonorgestrel (MIRENA) 20 MCG/24HR IUD, 1 each by Intrauterine route once., Disp: , Rfl:  .  Multiple Vitamin (MULTIVITAMIN WITH MINERALS) TABS tablet, Take 1 tablet by mouth daily., Disp: , Rfl:  .  rizatriptan (MAXALT-MLT) 5 MG disintegrating tablet, Take 1 tablet (5 mg total) by mouth as needed., Disp: 10 tablet, Rfl: 0  Allergies  Allergen Reactions  . Clindamycin/Lincomycin Nausea And Vomiting   . Nubain [Nalbuphine Hcl] Other (See Comments)    *feels like something crawling on her*   Review of Systems Objective:  There were no vitals filed for this visit.  General: Well developed, nourished, in no acute distress, alert and oriented x3   Dermatological: Skin is warm, dry and supple bilateral. Nails x 10 are well maintained; remaining integument appears unremarkable at this time. There are no open sores, no preulcerative lesions, no rash or signs of infection present.  Vascular: Dorsalis Pedis artery and Posterior Tibial artery pedal pulses are 2/4 bilateral with immedate capillary fill time. Pedal hair growth present. No varicosities and no lower extremity edema present bilateral.   Neruologic: Grossly intact via light touch bilateral. Vibratory intact via tuning fork bilateral. Protective threshold with Semmes Wienstein monofilament intact to all pedal sites bilateral. Patellar and Achilles deep tendon reflexes 2+ bilateral. No Babinski or clonus noted bilateral.   Musculoskeletal: No gross boney pedal deformities bilateral. No pain, crepitus, or limitation noted with foot and ankle range of motion bilateral. Muscular strength 5/5 in all groups tested bilateral.  There is pain on palpation of the posterior tibial tendon as it courses beneath the medial malleolus extending to the navicular tuberosity.  She has good inversion against resistance type moderately tender.  Gait: Unassisted, Nonantalgic.    Radiographs:  Radiographs taken today demonstrate an osseously mature  individual soft tissue swelling along the medial aspect of the left foot particularly along the course of the posterior tibial tendon to the level of the navicular.  Was to the level of the navicular bone there is an os naviculare that is present but appears to be slightly beneath the navicular tuberosity.  Assessment & Plan:   Assessment: At this point posterior tibial tendinitis with pes planus pes planovalgus.   Os naviculare.  Plan: Discussed etiology pathology conservative surgical therapies at this point we injected 10 mg of Kenalog around the tendon making sure not to inject into the tendon sheath itself and also start her on methylprednisolone to be followed by meloxicam placed her in a short cam boot and she will follow up with me in 1 month.  If not improved will consider MRI.     Elridge Stemm T. [place], [place]

## 2020-11-18 ENCOUNTER — Other Ambulatory Visit: Payer: Self-pay | Admitting: Podiatry

## 2020-11-18 DIAGNOSIS — M76822 Posterior tibial tendinitis, left leg: Secondary | ICD-10-CM

## 2020-11-24 ENCOUNTER — Other Ambulatory Visit: Payer: Self-pay | Admitting: Physical Medicine & Rehabilitation

## 2020-12-05 ENCOUNTER — Other Ambulatory Visit: Payer: Self-pay

## 2020-12-05 ENCOUNTER — Ambulatory Visit
Admission: RE | Admit: 2020-12-05 | Discharge: 2020-12-05 | Disposition: A | Discharge status: Home / Self Care | Payer: 59 | Source: Ambulatory Visit | Attending: Physical Medicine & Rehabilitation | Admitting: Physical Medicine & Rehabilitation

## 2020-12-05 ENCOUNTER — Encounter: Payer: 59 | Attending: Physical Medicine & Rehabilitation | Admitting: Physical Medicine & Rehabilitation

## 2020-12-05 ENCOUNTER — Encounter: Payer: Self-pay | Admitting: Physical Medicine & Rehabilitation

## 2020-12-05 VITALS — BP 149/92 | HR 88 | Temp 97.9°F | Ht 65.0 in | Wt 242.2 lb

## 2020-12-05 DIAGNOSIS — Z79891 Long term (current) use of opiate analgesic: Secondary | ICD-10-CM | POA: Diagnosis present

## 2020-12-05 DIAGNOSIS — Z5181 Encounter for therapeutic drug level monitoring: Secondary | ICD-10-CM | POA: Diagnosis present

## 2020-12-05 DIAGNOSIS — M533 Sacrococcygeal disorders, not elsewhere classified: Secondary | ICD-10-CM

## 2020-12-05 DIAGNOSIS — G894 Chronic pain syndrome: Secondary | ICD-10-CM | POA: Diagnosis present

## 2020-12-05 DIAGNOSIS — M545 Low back pain, unspecified: Secondary | ICD-10-CM

## 2020-12-05 NOTE — Patient Instructions (Signed)
You had a radio frequency procedure today This was done to alleviate joint pain in your lumbar area We injected lidocaine which is a local anesthetic.  You may experience soreness at the injection sites. You may also experienced some irritation of the nerves that were heated I'm recommending ice for 30 minutes every 2 hours as needed for the next 24-48 hours   

## 2020-12-05 NOTE — Progress Notes (Signed)
  PROCEDURE RECORD [facility]   Name: Maria Powers DOB:March 21, 1976 MRN: [id number]  Date:12/05/2020  Physician: Claudette Laws, MD    Nurse/CMA: Nedra Hai, CMA  Allergies:  Allergies  Allergen Reactions  . Clindamycin/Lincomycin Nausea And Vomiting  . Nubain [Nalbuphine Hcl] Other (See Comments)    *feels like something crawling on her*    Consent Signed: Yes.    Is patient diabetic? No.  CBG today?   Pregnant: No. LMP: No LMP recorded. (Menstrual status: IUD). (age [age])  Anticoagulants: no Anti-inflammatory: no Antibiotics: no  Procedure: right sacroiliac radiofrequency neurotomy Position: Prone Start Time: 11:25am End Time:11:42am  Fluoro Time: 24  RN/CMA Lambros Cerro RN Lee CMA    Time 10:36 11:44    BP 149/92 168/99    Pulse 88 87    Respirations 14 14    O2 Sat 97 96    S/S 6 6    Pain Level 5/10 5/10     D/C home , patient A & O X 3, D/C instructions reviewed, and sits independently.

## 2020-12-05 NOTE — Progress Notes (Signed)
RIGHT Sacroiliac radio frequency ablation under fluoroscopic guidance This consists of L5 dorsal ramus radio frequency ablation plus neuro lysis of S1-S2 -S3 lateral branches  Indication is sacroiliac pain which has improved temporarily onby at least 50% following sacroiliac intra-articular injection and L5 , S1,2,3 Lateral branch blocks under fluoroscopic guidance. Pain interferes with self-care and mobility and has failed to respond to conservative measures.  Informed consent was obtained after discussing risks and benefits of the procedure with the patient these include bleeding bruising and infection temporary or permanent paralysis. The patient elects to proceed and has given written consent.  Patient placed prone on fluoroscopy table. Area marked and prepped with Betadine. Fluoroscopic images utilized to guide needle. 25-gauge 1.5 inch needle was used to anesthetize 4 injection points with 2 cc of 1% lidocaine each. Then a 18-gauge 10 cm RF needle with a 10 mm curved active tip was inserted under fluoroscopic guidance first targeting the S1 SA P./sacral ala junction, bone contact made and confirmed with lateral imaging.  motor stimulation at 2 Hz confirm proper needle location followed by injection of one cc of a solution containing   1% lidocaine MPF. Radio frequency 80C for 80 seconds was performed. Then the inferolateral aspect of the S1, S2 and lateral aspect of S3 sacral foramina were targeted. Bone contact made.  motor stim at 2 Hz confirm proper needle location. One ML of the /lidocaine solution was injected into each of 3 sites and radio frequency ablation 80C for 90 seconds was performed. Patient tolerated procedure well. Post procedure instructions given  

## 2020-12-15 LAB — TOXASSURE SELECT,+ANTIDEPR,UR

## 2020-12-17 ENCOUNTER — Telehealth: Payer: Self-pay | Admitting: *Deleted

## 2020-12-17 NOTE — Telephone Encounter (Signed)
Urine drug screen for this encounter is consistent for prescribed medication 

## 2020-12-26 ENCOUNTER — Encounter: Payer: Self-pay | Admitting: Podiatry

## 2020-12-26 ENCOUNTER — Other Ambulatory Visit: Payer: Self-pay

## 2020-12-26 ENCOUNTER — Ambulatory Visit (INDEPENDENT_AMBULATORY_CARE_PROVIDER_SITE_OTHER): Payer: 59 | Admitting: Podiatry

## 2020-12-26 DIAGNOSIS — M66872 Spontaneous rupture of other tendons, left ankle and foot: Secondary | ICD-10-CM | POA: Diagnosis not present

## 2020-12-26 DIAGNOSIS — M76822 Posterior tibial tendinitis, left leg: Secondary | ICD-10-CM | POA: Diagnosis not present

## 2020-12-26 DIAGNOSIS — M87876 Other osteonecrosis, unspecified foot: Secondary | ICD-10-CM | POA: Diagnosis not present

## 2020-12-28 NOTE — Progress Notes (Signed)
She presents today for follow-up of her posterior tibial tendinitis of her left foot.  States that it still hurts a lot by the end of the day and his toes feel swollen and tight.  She continues to wear the boot on a regular basis states that is really just not getting any better.  Objective: Vital signs are stable she is alert and oriented x3.  Pulses are palpable.  Neurologic Storme is intact.  Degenerative flexors are intact muscle strength is normal symmetrical with exception of the posterior tibial tendon.  She has pain on direct palpation is of the medial aspect of the navicular tuberosity right at the insertion of the posterior tibial tendon along a os perineum.  Assessment: Posterior tibial tendinitis cannot rule out a tear of the posterior tibial tendinous insertion site of the os naviculare.  May have a type I os naviculare with enthesopathy.  Cannot rule out a fracture of the navicular tuberosity.  Plan: Discussed etiology pathology conservative surgical therapy at this point requesting an MRI due to failure of conservative therapies of her left foot.  We are looking for differential diagnoses as opposed to a tendon tear of the posterior tibial tendon.  This is surgical consideration.

## 2021-01-02 ENCOUNTER — Encounter: Payer: 59 | Attending: Physical Medicine & Rehabilitation | Admitting: Physical Medicine & Rehabilitation

## 2021-01-02 ENCOUNTER — Other Ambulatory Visit: Payer: Self-pay

## 2021-01-02 ENCOUNTER — Encounter: Payer: Self-pay | Admitting: Physical Medicine & Rehabilitation

## 2021-01-02 VITALS — BP 146/95 | HR 89 | Temp 98.3°F | Ht 65.0 in | Wt 244.0 lb

## 2021-01-02 DIAGNOSIS — M545 Low back pain, unspecified: Secondary | ICD-10-CM | POA: Insufficient documentation

## 2021-01-02 DIAGNOSIS — M533 Sacrococcygeal disorders, not elsewhere classified: Secondary | ICD-10-CM | POA: Insufficient documentation

## 2021-01-02 NOTE — Patient Instructions (Signed)
Try cutting meloxicam in 1/2

## 2021-01-02 NOTE — Progress Notes (Addendum)
Subjective:    Patient ID: Maria Powers, female    DOB: Dec 09, 1975, 46 y.o.   MRN: [id number]  HPI  45 year old female with chronic Right sided low back and buttock pain which has been treated on multiple occasions with both intra-articular sacroiliac injections and more recently RIght sacroiliac RF.     Trigger point injections were helpful for nocturnal pain, meloxicam helped with low back pain  Pain Inventory Average Pain 7 Pain Right Now 1 My pain is intermittent, constant, sharp, burning, dull, stabbing, tingling and aching  In the last 24 hours, has pain interfered with the following? General activity 0 Relation with others 0 Enjoyment of life 0 What TIME of day is your pain at its worst? varies Sleep (in general) Poor  Pain is worse with: unsure Pain improves with: medication and nerve ablation Relief from Meds:  fair      Family History  Problem Relation Age of Onset   Addison's disease Mother    Diabetes Mother    Bipolar disorder Mother    Fibroids Maternal Aunt    Social History   Socioeconomic History   Marital status: Single    Spouse name: Not on file   Number of children: Not on file   Years of education: Not on file   Highest education level: Not on file  Occupational History   Not on file  Tobacco Use   Smoking status: Former Smoker   Smokeless tobacco: Never Used  Building services engineer Use: Never used  Substance and Sexual Activity   Alcohol use: No    Alcohol/week: 0.0 standard drinks   Drug use: No   Sexual activity: Not Currently    Partners: Male    Birth control/protection: Abstinence, I.U.D.    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      Spiritual Beliefs:mormon      Lifestyle: no regular exercise; horrible diet            Social Determinants of Health   Financial Resource Strain:  Not on file  Food Insecurity: Not on file  Transportation Needs: Not on file  Physical Activity: Not on file  Stress: Not on file  Social Connections: Not on file   Past Surgical History:  Procedure Laterality Date   KIDNEY STONE SURGERY  2007   KNEE ARTHROSCOPY Right 2003   KNEE ARTHROSCOPY Right 2007   SHOULDER ARTHROSCOPY Right 2007   WISDOM TOOTH EXTRACTION  Highschool   Past Medical History:  Diagnosis Date   Anxiety    Bipolar 1 disorder (HCC)    Chronic pain    back, hip - seeing PMR   Depression    Dysmenorrhea    Hypertension    Obesity    BP (!) 146/95   Pulse 89   Temp 98.3 F (36.8 C)   Ht 5\' 5"  (1.651 m)   Wt 244 lb (110.7 kg)   SpO2 96%   BMI 40.60 kg/m   Opioid Risk Score:   Fall Risk Score:  `1  Depression screen PHQ 2/9  Depression screen [facility] 2/9 12/05/2020 11/21/2018 07/28/2018 02/28/2018 11/15/2017 10/24/2015 06/18/2015  Decreased Interest 1 1 3 1  0 1 1  Down, Depressed, Hopeless 1 1 3 1 1 1 1   PHQ - 2 Score 2 2 6 2 1 2  2  Altered sleeping - - - - - - -  Tired, decreased energy - - - - - - -  Change in appetite - - - - - - -  Feeling bad or failure about yourself  - - - - - - -  Trouble concentrating - - - - - - -  Moving slowly or fidgety/restless - - - - - - -  Suicidal thoughts - - - - - - -  PHQ-9 Score - - - - - - -   Review of Systems  Musculoskeletal: Positive for back pain and gait problem.       Pain across lower back and down right leg  All other systems reviewed and are negative.      Objective:   Physical Exam Vitals and nursing note reviewed.  Constitutional:      Appearance: She is obese.  HENT:     Head: Normocephalic and atraumatic.  Eyes:     Extraocular Movements: Extraocular movements intact.     Conjunctiva/sclera: Conjunctivae normal.     Pupils: Pupils are equal, round, and reactive to light.  Skin:    General: Skin is warm and dry.  Neurological:     Mental Status: She is alert and oriented to person, place,  and time.     Cranial Nerves: No cranial nerve deficit.     Motor: No weakness.  Psychiatric:        Mood and Affect: Mood normal.        Behavior: Behavior normal.    Negative straight leg raising No pain over the PSIS bilaterally Mild discomfort right L 2345 paraspinal Mild discomfort with lumbar extension no pain with lumbar flexion. Ambulates without assist device no evidence of toe drag or knee stability Normal strength bilateral lower extremities      Assessment & Plan:  #1.  Chronic left sacroiliac pain improved after repeat sacroiliac radiofrequency neurotomy performed on 12/05/2020  #2.  Right-sided low back pain lumbar x-rays unremarkable transitional segment of questionable clinical significance. Symptoms have improved with the start of meloxicam 15 mg daily which she has been used for her left foot pain started by podiatry.  The patient is having some GI discomfort as well as some insomnia.  We discussed that the insomnia is not a likely side effect of this medication.  The GI side effect may try taking 1/2 tablet i.e. 7.5 mg daily.  We also discussed that this is not a medication she should be on long-term and  3 months should be enough.  Continue tramadol 50 mg 4 times daily, Flexeril 10 mg 3 times daily and gabapentin 300 mg 3 times daily Physical medicine rehab follow-up in 4 months

## 2021-01-04 ENCOUNTER — Other Ambulatory Visit: Payer: Managed Care, Other (non HMO)

## 2021-01-07 ENCOUNTER — Telehealth: Payer: Self-pay | Admitting: *Deleted

## 2021-01-07 NOTE — Telephone Encounter (Signed)
-----   Message from Elinor Parkinson, [place] sent at 01/07/2021  7:04 AM EDT ----- Regarding: RE: P2P I need the information or it looks like she might need to come back in for another visit with xrays?! ----- Message ----- From: Kristian Covey, PMAC Sent: 01/02/2021  11:54 AM EDT To: Elinor Parkinson, DPM Subject: P2P                                            Good morning, this patient authorization was denied for the following reason: Based on [employer] Musculoskeletal Imaging Guidelines Section(s): MS 26 Ankle and 1.0 General Guidelines, we cannot approve this request. Your records show that you have ankle pain. The request cannot be approved because: You must provide results of an x-ray taken after your symptoms started or changed that show a need for further imaging.   Needs P2P

## 2021-01-10 NOTE — Telephone Encounter (Signed)
Lvm for patient,

## 2021-01-16 ENCOUNTER — Ambulatory Visit: Payer: 59 | Admitting: Podiatry

## 2021-02-22 ENCOUNTER — Other Ambulatory Visit: Payer: Self-pay | Admitting: Physical Medicine & Rehabilitation

## 2021-04-16 ENCOUNTER — Other Ambulatory Visit: Payer: Self-pay | Admitting: Physical Medicine & Rehabilitation

## 2021-05-08 ENCOUNTER — Encounter: Payer: Managed Care, Other (non HMO) | Admitting: Physical Medicine & Rehabilitation

## 2021-06-04 ENCOUNTER — Other Ambulatory Visit: Payer: Self-pay | Admitting: Physical Medicine & Rehabilitation

## 2021-06-27 ENCOUNTER — Encounter: Payer: 59 | Admitting: Family Medicine

## 2021-06-28 ENCOUNTER — Other Ambulatory Visit: Payer: Self-pay | Admitting: Physical Medicine & Rehabilitation

## 2021-07-02 ENCOUNTER — Other Ambulatory Visit: Payer: Self-pay | Admitting: Physical Medicine & Rehabilitation

## 2021-07-10 ENCOUNTER — Other Ambulatory Visit: Payer: Self-pay

## 2021-07-10 ENCOUNTER — Encounter
Payer: Managed Care, Other (non HMO) | Attending: Physical Medicine & Rehabilitation | Admitting: Physical Medicine & Rehabilitation

## 2021-07-10 ENCOUNTER — Encounter: Payer: Self-pay | Admitting: Physical Medicine & Rehabilitation

## 2021-07-10 VITALS — BP 158/94 | HR 98 | Ht 65.0 in | Wt 244.0 lb

## 2021-07-10 DIAGNOSIS — G8929 Other chronic pain: Secondary | ICD-10-CM | POA: Diagnosis present

## 2021-07-10 DIAGNOSIS — M533 Sacrococcygeal disorders, not elsewhere classified: Secondary | ICD-10-CM | POA: Insufficient documentation

## 2021-07-10 MED ORDER — METHYLPREDNISOLONE 4 MG PO TBPK: ORAL_TABLET | ORAL | 0 refills | Status: DC

## 2021-07-10 NOTE — Progress Notes (Signed)
Subjective:    Patient ID: Maria Powers, female    DOB: 1976/05/20, 45 y.o.   MRN: [id number]  HPI 45 year old female with right sacroiliac disorder which has been diagnosed with intra-articular injections as well as sacroiliac nerve blocks.  She has had good response usually averaging about 6 months status post right sacroiliac radiofrequency neurotomy.  Her last RF was 12/05/2020. Chronic recurrent Right buttocks pain which only partially responsive to medication management including tramadol Last urine toxicology on 12/05/2020 was consistent Pain Inventory Average Pain 5 Pain Right Now 7 My pain is constant, sharp, burning, stabbing, tingling, and aching  In the last 24 hours, has pain interfered with the following? General activity 9 Relation with others 9 Enjoyment of life 9 What TIME of day is your pain at its worst? morning , daytime, evening, night, and varies Sleep (in general) Fair  Pain is worse with: unsure Pain improves with: medication and injections Relief from Meds: 6  Family History  Problem Relation Age of Onset   Addison's disease Mother    Diabetes Mother    Bipolar disorder Mother    Fibroids Maternal Aunt    Social History   Socioeconomic History   Marital status: Single    Spouse name: Not on file   Number of children: Not on file   Years of education: Not on file   Highest education level: Not on file  Occupational History   Not on file  Tobacco Use   Smoking status: Former   Smokeless tobacco: Never  Vaping Use   Vaping Use: Never used  Substance and Sexual Activity   Alcohol use: No    Alcohol/week: 0.0 standard drinks   Drug use: No   Sexual activity: Not Currently    Partners: Male    Birth control/protection: Abstinence, I.U.D.    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      Spiritual  Beliefs:mormon      Lifestyle: no regular exercise; horrible diet            Social Determinants of Health   Financial Resource Strain: Not on file  Food Insecurity: Not on file  Transportation Needs: Not on file  Physical Activity: Not on file  Stress: Not on file  Social Connections: Not on file   Past Surgical History:  Procedure Laterality Date   KIDNEY STONE SURGERY  2007   KNEE ARTHROSCOPY Right 2003   KNEE ARTHROSCOPY Right 2007   SHOULDER ARTHROSCOPY Right 2007   WISDOM TOOTH EXTRACTION  Highschool   Past Surgical History:  Procedure Laterality Date   KIDNEY STONE SURGERY  2007   KNEE ARTHROSCOPY Right 2003   KNEE ARTHROSCOPY Right 2007   SHOULDER ARTHROSCOPY Right 2007   WISDOM TOOTH EXTRACTION  Highschool   Past Medical History:  Diagnosis Date   Anxiety    Bipolar 1 disorder (HCC)    Chronic pain    back, hip - seeing PMR   Depression    Dysmenorrhea    Hypertension    Obesity    BP (!) 118/115   Pulse 98   Ht 5\' 5"  (1.651 m)   Wt 244 lb (110.7 kg)   SpO2 98%   BMI 40.60 kg/m   Opioid Risk Score:   Fall Risk Score:  `1  Depression screen [facility] 2/9  Depression screen [facility] 2/9 01/02/2021 12/05/2020 11/21/2018 07/28/2018 02/28/2018 11/15/2017 10/24/2015  Decreased Interest 0 1 1 3 1  0 1  Down, Depressed, Hopeless 0 1 1 3 1 1 1   PHQ - 2 Score 0 2 2 6 2 1 2   Altered sleeping - - - - - - -  Tired, decreased energy - - - - - - -  Change in appetite - - - - - - -  Feeling bad or failure about yourself  - - - - - - -  Trouble concentrating - - - - - - -  Moving slowly or fidgety/restless - - - - - - -  Suicidal thoughts - - - - - - -  PHQ-9 Score - - - - - - -    Review of Systems  Musculoskeletal:  Positive for arthralgias and back pain.       Pain in right hand, left leg, left foot  All other systems reviewed and are negative.     Objective:   Physical Exam Vitals and nursing note reviewed.  Constitutional:      Appearance: She is obese.  HENT:      Head: Normocephalic and atraumatic.  Eyes:     Extraocular Movements: Extraocular movements intact.     Conjunctiva/sclera: Conjunctivae normal.     Pupils: Pupils are equal, round, and reactive to light.  Musculoskeletal:        General: No swelling or tenderness.     Comments: Tenderness over the right PSIS positive compression test  Skin:    General: Skin is warm and dry.  Neurological:     Mental Status: She is alert and oriented to person, place, and time.  Psychiatric:        Mood and Affect: Mood normal.        Behavior: Behavior normal.          Assessment & Plan:   1.  Chronic sacroiliac disorder recurrent, 7 months post right sacroiliac radiofrequency neurotomy.  We will schedule for repeat

## 2021-07-10 NOTE — Patient Instructions (Signed)
Do not take meloxicam while you are on the medrol dosepack

## 2021-08-03 ENCOUNTER — Telehealth: Payer: Managed Care, Other (non HMO) | Admitting: Physician Assistant

## 2021-08-03 DIAGNOSIS — R3989 Other symptoms and signs involving the genitourinary system: Secondary | ICD-10-CM

## 2021-08-03 MED ORDER — SULFAMETHOXAZOLE-TRIMETHOPRIM 800-160 MG PO TABS: 1.0000 | ORAL_TABLET | Freq: Two times a day (BID) | ORAL | 0 refills | Status: DC

## 2021-08-03 NOTE — Progress Notes (Signed)

## 2021-08-13 ENCOUNTER — Ambulatory Visit (HOSPITAL_COMMUNITY): Payer: Managed Care, Other (non HMO)

## 2021-08-14 ENCOUNTER — Ambulatory Visit (HOSPITAL_COMMUNITY): Payer: Managed Care, Other (non HMO)

## 2021-08-28 ENCOUNTER — Telehealth: Payer: Self-pay | Admitting: Physical Medicine & Rehabilitation

## 2021-08-28 NOTE — Telephone Encounter (Signed)
Called notified patient that the Prior Berkley Harvey has been denied, even after Dr Wynn Banker has called. Asked if she would like to postpone or move forward knowing she could be financially responsible for the almost 5000 bill, did estimate letter and notified patient of cost. She asked that her insurance be billed.  Would like to proceed. States she is aware of cost.

## 2021-08-29 ENCOUNTER — Other Ambulatory Visit: Payer: Self-pay

## 2021-08-29 ENCOUNTER — Encounter: Payer: Self-pay | Admitting: Physical Medicine & Rehabilitation

## 2021-08-29 ENCOUNTER — Encounter
Payer: Managed Care, Other (non HMO) | Attending: Physical Medicine & Rehabilitation | Admitting: Physical Medicine & Rehabilitation

## 2021-08-29 VITALS — Temp 98.3°F | Ht 65.0 in | Wt 238.0 lb

## 2021-08-29 DIAGNOSIS — M533 Sacrococcygeal disorders, not elsewhere classified: Secondary | ICD-10-CM | POA: Insufficient documentation

## 2021-08-29 NOTE — Patient Instructions (Signed)
Sacroiliac injection was performed today. A combination of numbing medicine (lidocaine) plus a cortisone medicine (betamethasone) was injected. The injection was done under x-ray guidance. This procedure has been performed to help reduce low back and buttocks pain as well as potentially hip pain. The duration of this injection is variable lasting from hours to  Months. It may repeated if needed. 

## 2021-08-29 NOTE — Progress Notes (Signed)
  PROCEDURE RECORD [facility]   Name: Maria Powers DOB:1976-02-17 MRN: [id number]  Date:08/29/2021  Physician: Claudette Laws, MD    Nurse/CMA: Charise Carwin  Allergies:  Allergies  Allergen Reactions   Clindamycin/Lincomycin Nausea And Vomiting   Nubain [Nalbuphine Hcl] Other (See Comments)    *feels like something crawling on her*    Consent Signed: Yes.    Is patient diabetic? No.  CBG today? N/A  Pregnant: No. LMP: No LMP recorded. (Menstrual status: IUD). (age 36-55)  Anticoagulants: no Anti-inflammatory: no Antibiotics: no  Procedure: Right Sacroiliac Steroid Injection Position: Prone Start Time: 11:37 AM  End Time:11:40 AM   Fluoro Time: 16  RN/CMA Devine Klingel MA Lexxus Underhill MA Donyale Berthold MA   Time 11:08 AM 11:45 AM 11;48 am   BP 149/98 174/111 160/104   Pulse 91 88    Respirations 16 16    O2 Sat 97 96    S/S 6 6    Pain Level 8/10 4/10     D/C home with Maria Powers, patient A & O X 3, D/C instructions reviewed, and sits independently.         Subjective:    Patient ID: Maria Powers, female    DOB: 11/19/1975, 45 y.o.   MRN: [id number]  HPI    Review of Systems     Objective:   Physical Exam        Assessment & Plan:

## 2021-08-29 NOTE — Progress Notes (Signed)

## 2021-09-26 NOTE — Progress Notes (Signed)
HPI: [name] is a 46 y.o. female, who is here today for her routine physical.  Last CPE: 2019  Regular exercise 3 or more time per week: Not consistent. Following a healthy diet: Raman and noodles and chips because they are cheaper. She eats a ham croissant for breakfast at work.  Chronic medical problems: HTN,prediabetes,and chronic back pain. HTN on non pharmacologic treatment. Stopped Amlodipine in 2021. Her BP was elevated at 156/102 in 06/2021.  Bipolar disorder, she is not following with psychiatrist.  Immunization History  Administered Date(s) Administered   Influenza Split 08/10/2012   Influenza,inj,Quad PF,6+ Mos 07/10/2013, 07/05/2014, 07/24/2015, 07/17/2017, 07/06/2018, 06/09/2019, 06/09/2019   Influenza-Unspecified 07/26/2016, 07/06/2018, 07/24/2020, 06/07/2021   PFIZER(Purple Top)SARS-COV-2 Vaccination 12/14/2019, 01/09/2020   Pfizer Covid-19 Vaccine Bivalent Booster 33yrs & up 06/07/2021   Tdap 07/12/2014   Health Maintenance  Topic Date Due   HIV Screening  07/23/2025 (Originally 01/20/1991)   PAP SMEAR-Modifier  02/22/2023   TETANUS/TDAP  07/12/2024   INFLUENZA VACCINE  Completed   COVID-19 Vaccine  Completed   Hepatitis C Screening  Completed   Pneumococcal Vaccine 41-50 Years old  Aged Out   HPV VACCINES  Aged Out   Established with gyn, Dr Judeth Horn, last visit 02/22/20.  Review of Systems  Constitutional:  Positive for fatigue. Negative for appetite change and fever.  HENT:  Negative for hearing loss, mouth sores, sore throat and trouble swallowing.   Eyes:  Negative for redness and visual disturbance.  Respiratory:  Negative for cough, shortness of breath and wheezing.   Cardiovascular:  Negative for chest pain and leg swelling.  Gastrointestinal:  Negative for abdominal pain, nausea and vomiting.       No changes in bowel habits.  Endocrine: Negative for cold intolerance, heat intolerance, polydipsia, polyphagia and polyuria.   Genitourinary:  Negative for decreased urine volume, dysuria, hematuria, vaginal bleeding and vaginal discharge.  Musculoskeletal:  Positive for back pain. Negative for gait problem.  Skin:  Negative for color change and rash.  Allergic/Immunologic: Negative for environmental allergies.  Neurological:  Negative for syncope, weakness and headaches.  Hematological:  Negative for adenopathy. Does not bruise/bleed easily.  Psychiatric/Behavioral:  Negative for confusion and sleep disturbance. The patient is not nervous/anxious.   All other systems reviewed and are negative.  Current Outpatient Medications on File Prior to Visit  Medication Sig Dispense Refill   levonorgestrel (MIRENA) 20 MCG/24HR IUD 1 each by Intrauterine route once.     meloxicam (MOBIC) 15 MG tablet TAKE ONE TABLET BY MOUTH DAILY 30 tablet 3   Multiple Vitamin (MULTIVITAMIN WITH MINERALS) TABS tablet Take 1 tablet by mouth daily.     rizatriptan (MAXALT-MLT) 5 MG disintegrating tablet Take 1 tablet (5 mg total) by mouth as needed. 10 tablet 0   traMADol (ULTRAM) 50 MG tablet TAKE 1 TABLET BY MOUTH FOUR TIMES A DAY 120 tablet 5   cyclobenzaprine (FLEXERIL) 10 MG tablet TAKE ONE TABLET BY MOUTH THREE TIMES A DAY 270 tablet 1   gabapentin (NEURONTIN) 600 MG tablet TAKE 1/2 TABLET BY MOUTH THREE TIMES A DAY 45 tablet 2   No current facility-administered medications on file prior to visit.   Past Medical History:  Diagnosis Date   Anxiety    Bipolar 1 disorder ([place])    Chronic pain    back, hip - seeing PMR   Depression    Dysmenorrhea    Hypertension    Obesity    Past Surgical History:  Procedure Laterality Date  KIDNEY STONE SURGERY  2007   KNEE ARTHROSCOPY Right 2003   KNEE ARTHROSCOPY Right 2007   SHOULDER ARTHROSCOPY Right 2007   WISDOM TOOTH EXTRACTION  Highschool    Allergies  Allergen Reactions   Clindamycin/Lincomycin Nausea And Vomiting   Nubain [Nalbuphine Hcl] Other (See Comments)    *feels like  something crawling on her*    Family History  Problem Relation Age of Onset   Addison's disease Mother    Diabetes Mother    Bipolar disorder Mother    Fibroids Maternal Aunt     Social History   Socioeconomic History   Marital status: Single    Spouse name: Not on file   Number of children: Not on file   Years of education: Not on file   Highest education level: Not on file  Occupational History   Not on file  Tobacco Use   Smoking status: Former   Smokeless tobacco: Never  Vaping Use   Vaping Use: Never used  Substance and Sexual Activity   Alcohol use: No    Alcohol/week: 0.0 standard drinks   Drug use: No   Sexual activity: Not Currently    Partners: Male    Birth control/protection: Abstinence, I.U.D.    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  Wt Readings from Last 3 Encounters:  09/29/21 239 lb 8 oz (108.6 kg)  08/29/21 238 lb (108 kg)  07/10/21 244 lb (110.7 kg)   Physical Exam Vitals and nursing note reviewed.  Constitutional:      General: She is not in acute distress.    Appearance: She is well-developed and well-groomed.  HENT:     Head: Normocephalic and atraumatic.     Right Ear: Hearing, tympanic membrane, ear canal and external ear normal.     Left Ear: Hearing, tympanic membrane, ear canal and external ear normal.     Mouth/Throat:     Mouth:  Mucous membranes are moist.     Pharynx: Oropharynx is clear. Uvula midline.  Eyes:     Extraocular Movements: Extraocular movements intact.     Conjunctiva/sclera: Conjunctivae normal.     Pupils: Pupils are equal, round, and reactive to light.  Neck:     Thyroid: No thyromegaly.     Trachea: No tracheal deviation.  Cardiovascular:     Rate and Rhythm: Normal rate and regular rhythm.     Pulses:          Dorsalis pedis pulses are 2+ on the right side and 2+ on the left side.     Heart sounds: Murmur (Soft SEM LUSB) heard.  Pulmonary:     Effort: Pulmonary effort is normal. No respiratory distress.     Breath sounds: Normal  breath sounds.  Abdominal:     Palpations: Abdomen is soft. There is no hepatomegaly or mass.     Tenderness: There is no abdominal tenderness.  Genitourinary:    Comments: Deferred to gyn. Musculoskeletal:     Comments: No major deformity or signs of synovitis appreciated.  Lymphadenopathy:     Cervical: No cervical adenopathy.     Upper Body:     Right upper body: No supraclavicular adenopathy.     Left upper body: No supraclavicular adenopathy.  Skin:    General: Skin is warm.     Findings: No erythema or rash.  Neurological:     General: No focal deficit present.     Mental Status: She is alert and oriented to person, place, and time.     Cranial Nerves: No cranial nerve deficit.     Coordination: Coordination normal.     Gait: Gait normal.     Deep Tendon Reflexes:     Reflex Scores:      Bicep reflexes are 2+ on the right side and 2+ on the left side.      Patellar reflexes are 2+ on the right side and 2+ on the left side. Psychiatric:        Mood and Affect: Affect is flat.     Comments: Well groomed, good eye contact.   ASSESSMENT AND PLAN:  Ms. Maria Powers was here today annual physical examination.  Orders Placed This Encounter  Procedures   Comprehensive metabolic panel   Hemoglobin A1c   Lipid panel   Hepatitis C antibody    Lab Results  Component Value Date   CREATININE 0.80 09/29/2021   BUN 12 09/29/2021   NA 138 09/29/2021   K 3.5 09/29/2021   CL 103 09/29/2021   CO2 28 09/29/2021   Lab Results  Component Value Date   ALT 22 09/29/2021   AST 18 09/29/2021   ALKPHOS 42 09/29/2021   BILITOT 0.5 09/29/2021   Lab Results  Component Value Date   CHOL 174 09/29/2021   HDL 48.20 09/29/2021   LDLCALC 112 (H) 09/29/2021   TRIG 66.0 09/29/2021   CHOLHDL 4 09/29/2021   Lab Results  Component Value Date   HGBA1C 5.9 09/29/2021   Routine general medical examination at a health care facility We discussed the importance of regular physical activity and healthy diet for prevention of chronic illness and/or complications. Preventive guidelines reviewed. Vaccination up to date. Continue her female preventive care with gyn. Next CPE in a year. The 10-year ASCVD risk score (Arnett DK, et al., 2019) is: 0.8%   Values used to calculate the score:     Age: 60 years     Sex: Female     Is Non-Hispanic African American: No     Diabetic: No     Tobacco smoker: No     Systolic Blood Pressure: [id number] mmHg     Is BP treated: No     HDL Cholesterol: 48.2 mg/dL     Total Cholesterol: 174 mg/dL  Essential hypertension, benign BP adequately controlled today. Continue non pharmacologic treatment. Monitor BP at home regularly.  Encounter for hepatitis C screening test for low risk patient -     Hepatitis C antibody  Screening for lipoid disorders -     Lipid panel  Screening for endocrine, metabolic and immunity disorder -     Hemoglobin A1c -     Comprehensive metabolic panel  Return in 1 year (on 09/29/2022) for  CPE.  Samy Ryner G. Martinique, MD  [facility]. [place] office.

## 2021-09-28 ENCOUNTER — Other Ambulatory Visit: Payer: Self-pay | Admitting: Physical Medicine & Rehabilitation

## 2021-09-29 ENCOUNTER — Ambulatory Visit (INDEPENDENT_AMBULATORY_CARE_PROVIDER_SITE_OTHER): Payer: Managed Care, Other (non HMO) | Admitting: Family Medicine

## 2021-09-29 ENCOUNTER — Encounter: Payer: Self-pay | Admitting: Family Medicine

## 2021-09-29 VITALS — BP 130/80 | HR 99 | Temp 98.2°F | Resp 16 | Ht 65.0 in | Wt 239.5 lb

## 2021-09-29 DIAGNOSIS — Z0000 Encounter for general adult medical examination without abnormal findings: Secondary | ICD-10-CM

## 2021-09-29 DIAGNOSIS — Z1159 Encounter for screening for other viral diseases: Secondary | ICD-10-CM

## 2021-09-29 DIAGNOSIS — R7303 Prediabetes: Secondary | ICD-10-CM

## 2021-09-29 DIAGNOSIS — Z130 Encounter for screening for diseases of the blood and blood-forming organs and certain disorders involving the immune mechanism: Secondary | ICD-10-CM

## 2021-09-29 DIAGNOSIS — Z13228 Encounter for screening for other metabolic disorders: Secondary | ICD-10-CM

## 2021-09-29 DIAGNOSIS — I10 Essential (primary) hypertension: Secondary | ICD-10-CM

## 2021-09-29 DIAGNOSIS — Z1329 Encounter for screening for other suspected endocrine disorder: Secondary | ICD-10-CM | POA: Diagnosis not present

## 2021-09-29 DIAGNOSIS — Z13220 Encounter for screening for lipoid disorders: Secondary | ICD-10-CM

## 2021-09-29 LAB — COMPREHENSIVE METABOLIC PANEL
ALT: 22 U/L (ref 0–35)
AST: 18 U/L (ref 0–37)
Albumin: 4.3 g/dL (ref 3.5–5.2)
Alkaline Phosphatase: 42 U/L (ref 39–117)
BUN: 12 mg/dL (ref 6–23)
CO2: 28 mEq/L (ref 19–32)
Calcium: 9.3 mg/dL (ref 8.4–10.5)
Chloride: 103 mEq/L (ref 96–112)
Creatinine, Ser: 0.8 mg/dL (ref 0.40–1.20)
GFR: 89.44 mL/min (ref >60.00)
Glucose, Bld: 98 mg/dL (ref 70–99)
Potassium: 3.5 mEq/L (ref 3.5–5.1)
Sodium: 138 mEq/L (ref 135–145)
Total Bilirubin: 0.5 mg/dL (ref 0.2–1.2)
Total Protein: 7 g/dL (ref 6.0–8.3)

## 2021-09-29 LAB — LIPID PANEL
Cholesterol: 174 mg/dL (ref 0–200)
HDL: 48.2 mg/dL (ref >39.00)
LDL Cholesterol: 112 mg/dL — ABNORMAL HIGH (ref 0–99)
NonHDL: 125.45
Total CHOL/HDL Ratio: 4
Triglycerides: 66 mg/dL (ref 0.0–149.0)
VLDL: 13.2 mg/dL (ref 0.0–40.0)

## 2021-09-29 LAB — HEMOGLOBIN A1C: Hgb A1c MFr Bld: 5.9 % (ref 4.6–6.5)

## 2021-09-29 NOTE — Patient Instructions (Addendum)
A few things to remember from today's visit:  Routine general medical examination at a health care facility  Essential hypertension, benign  Encounter for hepatitis C screening test for low risk patient - Plan: Hepatitis C antibody  Screening for lipoid disorders - Plan: Lipid panel  Screening for endocrine, metabolic and immunity disorder - Plan: Comprehensive metabolic panel, Hemoglobin A1c  Do not use My Chart to request refills or for acute issues that need immediate attention.  Please be sure medication list is accurate. If a new problem present, please set up appointment sooner than planned today.  Health Maintenance, Female Adopting a healthy lifestyle and getting preventive care are important in promoting health and wellness. Ask your health care provider about: The right schedule for you to have regular tests and exams. Things you can do on your own to prevent diseases and keep yourself healthy. What should I know about diet, weight, and exercise? Eat a healthy diet  Eat a diet that includes plenty of vegetables, fruits, low-fat dairy products, and lean protein. Do not eat a lot of foods that are high in solid fats, added sugars, or sodium. Maintain a healthy weight Body mass index (BMI) is used to identify weight problems. It estimates body fat based on height and weight. Your health care provider can help determine your BMI and help you achieve or maintain a healthy weight. Get regular exercise Get regular exercise. This is one of the most important things you can do for your health. Most adults should: Exercise for at least 150 minutes each week. The exercise should increase your heart rate and make you sweat (moderate-intensity exercise). Do strengthening exercises at least twice a week. This is in addition to the moderate-intensity exercise. Spend less time sitting. Even light physical activity can be beneficial. Watch cholesterol and blood lipids Have your blood  tested for lipids and cholesterol at 45 years of age, then have this test every 5 years. Have your cholesterol levels checked more often if: Your lipid or cholesterol levels are high. You are older than 45 years of age. You are at high risk for heart disease. What should I know about cancer screening? Depending on your health history and family history, you may need to have cancer screening at various ages. This may include screening for: Breast cancer. Cervical cancer. Colorectal cancer. Skin cancer. Lung cancer. What should I know about heart disease, diabetes, and high blood pressure? Blood pressure and heart disease High blood pressure causes heart disease and increases the risk of stroke. This is more likely to develop in people who have high blood pressure readings or are overweight. Have your blood pressure checked: Every 3-5 years if you are 44-40 years of age. Every year if you are 57 years old or older. Diabetes Have regular diabetes screenings. This checks your fasting blood sugar level. Have the screening done: Once every three years after age 33 if you are at a normal weight and have a low risk for diabetes. More often and at a younger age if you are overweight or have a high risk for diabetes. What should I know about preventing infection? Hepatitis B If you have a higher risk for hepatitis B, you should be screened for this virus. Talk with your health care provider to find out if you are at risk for hepatitis B infection. Hepatitis C Testing is recommended for: Everyone born from [birth year] through 1964. Anyone with known risk factors for hepatitis C. Sexually transmitted infections (STIs) Get screened  for STIs, including gonorrhea and chlamydia, if: You are sexually active and are younger than 45 years of age. You are older than 45 years of age and your health care provider tells you that you are at risk for this type of infection. Your sexual activity has changed since  you were last screened, and you are at increased risk for chlamydia or gonorrhea. Ask your health care provider if you are at risk. Ask your health care provider about whether you are at high risk for HIV. Your health care provider may recommend a prescription medicine to help prevent HIV infection. If you choose to take medicine to prevent HIV, you should first get tested for HIV. You should then be tested every 3 months for as long as you are taking the medicine. Pregnancy If you are about to stop having your period (premenopausal) and you may become pregnant, seek counseling before you get pregnant. Take 400 to 800 micrograms (mcg) of folic acid every day if you become pregnant. Ask for birth control (contraception) if you want to prevent pregnancy. Osteoporosis and menopause Osteoporosis is a disease in which the bones lose minerals and strength with aging. This can result in bone fractures. If you are 44 years old or older, or if you are at risk for osteoporosis and fractures, ask your health care provider if you should: Be screened for bone loss. Take a calcium or vitamin D supplement to lower your risk of fractures. Be given hormone replacement therapy (HRT) to treat symptoms of menopause. Follow these instructions at home: Alcohol use Do not drink alcohol if: Your health care provider tells you not to drink. You are pregnant, may be pregnant, or are planning to become pregnant. If you drink alcohol: Limit how much you have to: 0-1 drink a day. Know how much alcohol is in your drink. In the [place] one drink equals one 12 oz bottle of beer (355 mL), one 5 oz glass of wine (148 mL), or one 1 oz glass of hard liquor (44 mL). Lifestyle Do not use any products that contain nicotine or tobacco. These products include cigarettes, chewing tobacco, and vaping devices, such as e-cigarettes. If you need help quitting, ask your health care provider. Do not use street drugs. Do not share  needles. Ask your health care provider for help if you need support or information about quitting drugs. General instructions Schedule regular health, dental, and eye exams. Stay current with your vaccines. Tell your health care provider if: You often feel depressed. You have ever been abused or do not feel safe at home. Summary Adopting a healthy lifestyle and getting preventive care are important in promoting health and wellness. Follow your health care provider's instructions about healthy diet, exercising, and getting tested or screened for diseases. Follow your health care provider's instructions on monitoring your cholesterol and blood pressure. This information is not intended to replace advice given to you by your health care provider. Make sure you discuss any questions you have with your health care provider. Document Revised: 01/27/2021 Document Reviewed: 01/27/2021 Elsevier Patient Education  [place].

## 2021-09-30 LAB — HEPATITIS C ANTIBODY
Hepatitis C Ab: NONREACTIVE
SIGNAL TO CUT-OFF: 0.12 (ref <1.00)

## 2021-10-07 ENCOUNTER — Other Ambulatory Visit: Payer: Self-pay | Admitting: Orthopedic Surgery

## 2021-10-07 DIAGNOSIS — M533 Sacrococcygeal disorders, not elsewhere classified: Secondary | ICD-10-CM

## 2021-10-10 ENCOUNTER — Telehealth: Payer: Self-pay | Admitting: Family Medicine

## 2021-10-10 NOTE — Telephone Encounter (Signed)
Patient brought in paperwork that she needs Dr.Jordan to complete. Paperwork would be placed in folder.  Patient could be contacted at [phone].  Please advise.

## 2021-10-13 NOTE — Telephone Encounter (Signed)
Form on pcp's desk to be signed.  

## 2021-10-14 NOTE — Telephone Encounter (Signed)
Patient is aware that form is up front & ready for pick up. Copy sent to scan.

## 2021-11-06 ENCOUNTER — Ambulatory Visit
Admission: RE | Admit: 2021-11-06 | Discharge: 2021-11-06 | Disposition: A | Discharge status: Home / Self Care | Payer: Managed Care, Other (non HMO) | Source: Ambulatory Visit | Attending: Orthopedic Surgery | Admitting: Orthopedic Surgery

## 2021-11-06 ENCOUNTER — Other Ambulatory Visit: Payer: Self-pay

## 2021-11-06 DIAGNOSIS — M533 Sacrococcygeal disorders, not elsewhere classified: Secondary | ICD-10-CM

## 2021-11-20 ENCOUNTER — Telehealth: Payer: Self-pay | Admitting: Physical Medicine & Rehabilitation

## 2021-11-20 NOTE — Telephone Encounter (Signed)
Patient is having fusion on 3/21 please advise if she should be having this injection prior She is scheduled for 3/16.  Thank you    ?

## 2021-11-21 ENCOUNTER — Telehealth: Payer: Self-pay | Admitting: Physical Medicine & Rehabilitation

## 2021-11-21 NOTE — Telephone Encounter (Signed)
LVM for patient to contact our office ?Dr Letta Pate recommends not doing injection if she is going in for surgery    ?I would recommend no injection prefusion given that the steroid still may be active 1 week later, may cancel  ?

## 2021-12-03 ENCOUNTER — Other Ambulatory Visit: Payer: Self-pay | Admitting: Physical Medicine & Rehabilitation

## 2021-12-04 ENCOUNTER — Encounter: Payer: Managed Care, Other (non HMO) | Admitting: Physical Medicine & Rehabilitation

## 2021-12-05 ENCOUNTER — Telehealth: Payer: Self-pay | Admitting: *Deleted

## 2021-12-05 NOTE — Telephone Encounter (Signed)
Maria Powers called to reoirt her surgery has been delayed due to insurance approval. She was asking if we could do something for her to get her through the next few weeks. I called her back to get more information but she did not answer. I left VM to let her know that Maria Powers had refilled her tramdol but if she needed something different she would need to call back and clarify. ?

## 2021-12-10 ENCOUNTER — Telehealth: Payer: Self-pay | Admitting: *Deleted

## 2021-12-10 DIAGNOSIS — G8929 Other chronic pain: Secondary | ICD-10-CM

## 2021-12-10 DIAGNOSIS — M533 Sacrococcygeal disorders, not elsewhere classified: Secondary | ICD-10-CM

## 2021-12-10 NOTE — Telephone Encounter (Signed)
Maria Powers called upset that her surgery has been put off again because of insurance  (they are requiring she have a psych eval to verify this is not "a figment of her imagination". She is asking if  ? ?1) Dr Letta Pate has someone he would recommend she see. ?2) the tramadol is not helping and her pain level is very high. She is asking if Dr Letta Pate could prescribe her something else. The surgeon offered to prescribe something but she did not want to take it because of her seeing Dr Letta Pate.  Please advise. ?

## 2021-12-11 NOTE — Telephone Encounter (Signed)
Due to Dr Wynn Banker being out of the office through Tuesday 3/28 and unable to repsond, I have let Deonne know and if her surgeon wants to prescribe her something for now I will document that information in her chart. ?

## 2021-12-16 ENCOUNTER — Telehealth: Payer: Self-pay

## 2021-12-16 NOTE — Addendum Note (Signed)
Addended by: Doreene Eland on: 12/16/2021 10:59 AM ? ? Modules accepted: Orders ? ?

## 2021-12-16 NOTE — Telephone Encounter (Signed)
I have asked that Maria Powers request the ortho note be faxed to our office for Dr Wynn Banker to review. They would not give her anything for pain due to her being under contract here, so she is asking for something stronger for pain until she can get this situation handled ?

## 2021-12-16 NOTE — Telephone Encounter (Signed)
Patient request for Dr. Letta Pate: ? ?Maria Powers has requested a letter to support her pending surgery, for insurance purposes. The letter should state the surgery is needed for her physical condition (not mental). Patient also would like it to include any treatments she has had for the pain.   ? ?Call back phone [phone]. ?

## 2021-12-16 NOTE — Addendum Note (Signed)
Addended by: Doreene Eland on: 12/16/2021 09:00 AM ? ? Modules accepted: Orders ? ?

## 2021-12-22 ENCOUNTER — Other Ambulatory Visit: Payer: Self-pay | Admitting: Physical Medicine & Rehabilitation

## 2021-12-22 ENCOUNTER — Telehealth: Payer: Self-pay | Admitting: *Deleted

## 2021-12-22 NOTE — Telephone Encounter (Signed)
Maria Powers called to inquire about the status of the letter Dr Wynn Banker was going to write for her insurance company re: surgery on back. ?

## 2021-12-23 NOTE — Telephone Encounter (Signed)
Maria Powers picked up the letter today. ?

## 2022-01-20 HISTORY — PX: SACROILIAC JOINT FUSION: SHX6088

## 2022-02-20 ENCOUNTER — Telehealth (INDEPENDENT_AMBULATORY_CARE_PROVIDER_SITE_OTHER): Payer: Managed Care, Other (non HMO) | Admitting: Family Medicine

## 2022-02-20 ENCOUNTER — Encounter: Payer: Self-pay | Admitting: Family Medicine

## 2022-02-20 VITALS — Ht 65.0 in

## 2022-02-20 DIAGNOSIS — G4700 Insomnia, unspecified: Secondary | ICD-10-CM

## 2022-02-20 DIAGNOSIS — F319 Bipolar disorder, unspecified: Secondary | ICD-10-CM | POA: Diagnosis not present

## 2022-02-20 MED ORDER — DOXEPIN HCL 10 MG PO CAPS: 10.0000 mg | ORAL_CAPSULE | Freq: Every day | ORAL | 0 refills | Status: DC

## 2022-02-20 NOTE — Progress Notes (Signed)
Virtual Visit via Video Note I connected with Maria Powers on 02/20/22 by a video enabled telemedicine application and verified that I am speaking with the correct person using two identifiers.  Location patient: home Location provider:work office Persons participating in the virtual visit: patient, provider  I discussed the limitations of evaluation and management by telemedicine and the availability of in person appointments. The patient expressed understanding and agreed to proceed.  Chief Complaint  Patient presents with   Insomnia    Ongoing for years, has tried many different medications. Was taking medication for her back, but that has stopped taking since having surgery. Insomnia came back after that. Was taking hydrocodone 5-325 after surgery. Using Tizanidine as needed for muscle spasms; Flexiril was not helping any longer.    HPI: Maria Powers is a 46 yo female with Hx of anxiety, hypertension, bipolar disorder, and chronic back pain complaining of worsening insomnia. Until recently she was taking pain medication and muscle relaxant at night to help with sleep. Insomnia Primary symptoms: fragmented sleep, difficulty falling asleep.   The current episode started more than one month. The problem occurs intermittently. The problem has been gradually worsening since onset. The symptoms are aggravated by anxiety. How many beverages per day that contain caffeine: 0 - 1.  Types of beverages you drink: soda. Nothing relieves the symptoms. Past treatments include medication. The treatment provided no relief. Typical bedtime:  Other.  How long after going to bed to you fall asleep: other.   PMH includes: depression, chronic pain.  Prior diagnostic workup includes:  No prior workup.   Reports that she has tried many medications in the past and most of them did not help.  Son medication she tried her trazodone, Ambien, melatonin, Seroquel,Abilify, and Xanax.  Remember trying doxepin 10 mg,which  helped when she took it in 2020. She had a prescription from 2020, tried and it did not help. Bipolar disorder, has not been on medications since 2020, she used to go to [place], could not afford continue psychiatric care. Denies maniac like symptoms or depressed mood.  ROS: See pertinent positives and negatives per HPI.  Past Medical History:  Diagnosis Date   Anxiety    Bipolar 1 disorder ([place])    Chronic pain    back, hip - seeing PMR   Depression    Dysmenorrhea    Hypertension    Obesity    Past Surgical History:  Procedure Laterality Date   KIDNEY STONE SURGERY  2007   KNEE ARTHROSCOPY Right 2003   KNEE ARTHROSCOPY Right 2007   SHOULDER ARTHROSCOPY Right 2007   WISDOM TOOTH EXTRACTION  Highschool   Family History  Problem Relation Age of Onset   Addison's disease Mother    Diabetes Mother    Bipolar disorder Mother    Fibroids Maternal Aunt    Social History   Socioeconomic History   Marital status: Single    Spouse name: Not on file   Number of children: Not on file   Years of education: Not on file   Highest education level: Not on file  Occupational History   Not on file  Tobacco Use   Smoking status: Former   Smokeless tobacco: Never  Vaping Use   Vaping Use: Never used  Substance and Sexual Activity   Alcohol use: No    Alcohol/week: 0.0 standard drinks   Drug use: No   Sexual activity: Not Currently    Partners: Male    Birth control/protection: Abstinence,  I.U.D.    Comment: Mirena IUD inserted 06-21-15  Other Topics Concern   Not on file  Social History Narrative   Work or School: Armed forces technical officer      Home Situation: roommate      Spiritual Beliefs:mormon      Lifestyle: no regular exercise; horrible diet            Social Determinants of Health   Financial Resource Strain: Not on file  Food Insecurity: Not on file  Transportation Needs: Not on file  Physical Activity: Not on file  Stress: Not on file  Social  Connections: Not on file  Intimate Partner Violence: Not on file   Current Outpatient Medications:    cyclobenzaprine (FLEXERIL) 10 MG tablet, TAKE ONE TABLET BY MOUTH THREE TIMES A DAY, Disp: 270 tablet, Rfl: 1   gabapentin (NEURONTIN) 600 MG tablet, TAKE 1/2 TABLET BY MOUTH THREE TIMES A DAY, Disp: 45 tablet, Rfl: 2   levonorgestrel (MIRENA) 20 MCG/24HR IUD, 1 each by Intrauterine route once., Disp: , Rfl:    meloxicam (MOBIC) 15 MG tablet, TAKE ONE TABLET BY MOUTH DAILY, Disp: 30 tablet, Rfl: 3   Multiple Vitamin (MULTIVITAMIN WITH MINERALS) TABS tablet, Take 1 tablet by mouth daily., Disp: , Rfl:    rizatriptan (MAXALT-MLT) 5 MG disintegrating tablet, Take 1 tablet (5 mg total) by mouth as needed., Disp: 10 tablet, Rfl: 0   traMADol (ULTRAM) 50 MG tablet, TAKE ONE TABLET BY MOUTH FOUR TIMES A DAY, Disp: 120 tablet, Rfl: 5  EXAM:  VITALS per patient if applicable:Ht 5\' 5"  (1.651 m)   BMI 39.85 kg/m   GENERAL: alert, oriented, appears well and in no acute distress  HEENT: atraumatic, conjunctiva clear, no obvious abnormalities on inspection.  NECK: normal movements of the head and neck  LUNGS: on inspection no signs of respiratory distress, breathing rate appears normal, no obvious gross SOB, gasping or wheezing  CV: no obvious cyanosis  MS: moves all visible extremities without noticeable abnormality  PSYCH/NEURO: pleasant and cooperative, no obvious depression or anxiety, speech and thought processing grossly intact  ASSESSMENT AND PLAN:  Discussed the following assessment and plan:  Insomnia, unspecified type We discussed possible etiologies. She has tried several medications in the past and successfully. The doxepin she tried now is from 2020, so she agrees with trying a new prescription 10 mg. Dose of pain can be increased from 10 mg to 20 mg if needed. Adequate sleep hygiene also recommended. We discussed possible interaction with some of her medications, so avoid  taking tramadol and Flexeril when taking doxepin.  Bipolar 1 disorder ([place]) Explained that insomnia could be caused by bipolar disorder. She cannot have for seen a psychiatrist. If insomnia is not any better, we could try Olanzapine with or without fluoxetine. Instructed about warning signs.  We discussed possible serious and likely etiologies, options for evaluation and workup, limitations of telemedicine visit vs in person visit, treatment, treatment risks and precautions. The patient was advised to call back or seek an in-person evaluation if the symptoms worsen or if the condition fails to improve as anticipated. I discussed the assessment and treatment plan with the patient. The patient was provided an opportunity to ask questions and all were answered. The patient agreed with the plan and demonstrated an understanding of the instructions.  Return in about 2 weeks (around 03/06/2022).  Alfonzia Woolum G. Martinique, MD  [facility]. [place] office.

## 2022-03-03 NOTE — Progress Notes (Unsigned)
HPI: [name] is a 46 y.o. female with hx of bipolar disorder, headache,HTN,and chronic back pain here today to follow on recent visit. She was seen on 6 10/23/2021, when she was complaining about worsening insomnia. She was sleeping better while she was on opioid and muscle relaxants, these medications have been discontinued after lumbar spine surgery. She has tried several medications in the past, some names she does not remember: Trazodone, Ambien, melatonin, Doxepin,Seroquel,Abilify, and Xanax. Last visit she agreed with trying Doxepin again,10-20 mg but it did not help. She denies depressed mood of maniac like symptoms. Sleeping up to 2-3 hours and some night she has not slept at all.     03/04/2022    3:30 PM 09/29/2021    8:23 AM 08/29/2021   11:16 AM 07/10/2021    2:04 PM 01/02/2021   11:36 AM  Depression screen PHQ 2/9  Decreased Interest 0 3 0 0 0  Down, Depressed, Hopeless 0  0 0 0  PHQ - 2 Score 0 3 0 0 0  Altered sleeping 3 1     Tired, decreased energy 1 2     Change in appetite 0 0     Feeling bad or failure about yourself  0 3     Trouble concentrating 0 1     Moving slowly or fidgety/restless 0 0     Suicidal thoughts 0 3     PHQ-9 Score 4 13     Difficult doing work/chores Somewhat difficult       She would like a prescription for Quviviq, she has a coupon. She has not seen psychiatrist for about 2 years,she cannot afford it.  She does not remember if she tried Olanzapine   Review of Systems  Constitutional:  Positive for fatigue. Negative for appetite change and fever.  Respiratory:  Negative for shortness of breath.   Gastrointestinal:  Negative for abdominal pain, nausea and vomiting.  Musculoskeletal:  Positive for back pain.  Psychiatric/Behavioral:  Negative for confusion and hallucinations.   Rest see pertinent positives and negatives per HPI.  Current Outpatient Medications on File Prior to Visit  Medication Sig Dispense Refill    cyclobenzaprine (FLEXERIL) 10 MG tablet TAKE ONE TABLET BY MOUTH THREE TIMES A DAY 270 tablet 1   doxepin (SINEQUAN) 10 MG capsule Take 1-2 capsules (10-20 mg total) by mouth at bedtime. 30 capsule 0   gabapentin (NEURONTIN) 600 MG tablet TAKE 1/2 TABLET BY MOUTH THREE TIMES A DAY 45 tablet 2   levonorgestrel (MIRENA) 20 MCG/24HR IUD 1 each by Intrauterine route once.     meloxicam (MOBIC) 15 MG tablet TAKE ONE TABLET BY MOUTH DAILY 30 tablet 3   Multiple Vitamin (MULTIVITAMIN WITH MINERALS) TABS tablet Take 1 tablet by mouth daily.     rizatriptan (MAXALT-MLT) 5 MG disintegrating tablet Take 1 tablet (5 mg total) by mouth as needed. 10 tablet 0   traMADol (ULTRAM) 50 MG tablet TAKE ONE TABLET BY MOUTH FOUR TIMES A DAY 120 tablet 5   No current facility-administered medications on file prior to visit.   Past Medical History:  Diagnosis Date   Anxiety    Bipolar 1 disorder (H[facility])    Chronic pain    back, hip - seeing PMR   Depression    Dysmenorrhea    Hypertension    Obesity    Allergies  Allergen Reactions   Clindamycin/Lincomycin Nausea And Vomiting   Nubain [Nalbuphine Hcl] Other (See Comments)    *  feels like something crawling on her*   Social History   Socioeconomic History   Marital status: Single    Spouse name: Not on file   Number of children: Not on file   Years of education: Not on file   Highest education level: Not on file  Occupational History   Not on file  Tobacco Use   Smoking status: Former   Smokeless tobacco: Never  Vaping Use   Vaping Use: Never used  Substance and Sexual Activity   Alcohol use: No    Alcohol/week: 0.0 standard drinks of alcohol   Drug use: No   Sexual activity: Not Currently    Partners: Male    Birth control/protection: Abstinence, I.U.D.    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       Spiritual Beliefs:mormon      Lifestyle: no regular exercise; horrible diet            Social Determinants of Health   Financial Resource Strain: Not on file  Food Insecurity: Not on file  Transportation Needs: Not on file  Physical Activity: Not on file  Stress: Not on file  Social Connections: Not on file   Vitals:   03/04/22 1522  BP: 128/80  Pulse: (!) 103  Resp: 16  SpO2: 97%   Body mass index is 38.96 kg/m.  Physical Exam Vitals and nursing note reviewed.  Constitutional:      General: She is not in acute distress.    Appearance: She is well-developed.  HENT:     Head: Normocephalic and atraumatic.  Eyes:     Conjunctiva/sclera: Conjunctivae normal.  Cardiovascular:     Rate and Rhythm: Regular rhythm. Tachycardia present.     Heart sounds: No murmur heard. Pulmonary:     Effort: Pulmonary effort is normal. No respiratory distress.     Breath sounds: Normal breath sounds.  Skin:    General: Skin is warm.     Findings: No erythema or rash.  Neurological:     Mental Status: She is alert and oriented to person, place, and time.     Comments: Antalgic gait, not assisted.  Psychiatric:        Speech: Speech normal.        Thought Content: Thought content does not include suicidal ideation. Thought content does not include suicidal plan.   ASSESSMENT AND PLAN:  [name] was seen today for follow-up.  Diagnoses and all orders for this visit:  Insomnia, unspecified type Doxepin did not help. She would like to try Quviviq, we reviewed side effects, including sleep walking and driving. She was instructed to start Quviviq 25 mg at bedtime and can take 2 tabs in a week if needed. Good sleep hygiene. Instructed about warning signs.  -     Daridorexant HCl (QUVIVIQ) 25 MG TABS; Take 1 tablet by mouth at bedtime as needed.  Bipolar 1 disorder (H[facility]) She cannot afford to re-establish with psychiatrist. Insomnia can be related to this problem. She has tried  different medications, including Seroquel and Abilify. She is not sure if she tried Olanzapine, would like hold on it at this time.  Return in about 4 weeks (around 04/01/2022).  Maria Powers. [place], MD  [facility]. [place] office.

## 2022-03-04 ENCOUNTER — Ambulatory Visit: Payer: Managed Care, Other (non HMO) | Admitting: Family Medicine

## 2022-03-04 ENCOUNTER — Encounter: Payer: Self-pay | Admitting: Family Medicine

## 2022-03-04 VITALS — BP 128/80 | HR 103 | Resp 16 | Ht 65.0 in | Wt 234.1 lb

## 2022-03-04 DIAGNOSIS — G4700 Insomnia, unspecified: Secondary | ICD-10-CM

## 2022-03-04 DIAGNOSIS — F319 Bipolar disorder, unspecified: Secondary | ICD-10-CM

## 2022-03-04 MED ORDER — QUVIVIQ 25 MG PO TABS: 1.0000 | ORAL_TABLET | Freq: Every evening | ORAL | 0 refills | Status: DC | PRN

## 2022-03-04 NOTE — Patient Instructions (Signed)
A few things to remember from today's visit:  Insomnia, unspecified type - Plan: Daridorexant HCl (QUVIVIQ) 25 MG TABS  Bipolar 1 disorder (HCC)  If you need refills please call your pharmacy. Do not use My Chart to request refills or for acute issues that need immediate attention.   Try sleep medication. Please read side effects and let me know if is helps. You can increase dose to 2 tabs in a week if not helping.  Please be sure medication list is accurate. If a new problem present, please set up appointment sooner than planned today.

## 2022-03-21 ENCOUNTER — Other Ambulatory Visit: Payer: Self-pay | Admitting: Family Medicine

## 2022-03-29 ENCOUNTER — Other Ambulatory Visit: Payer: Self-pay | Admitting: Physical Medicine & Rehabilitation

## 2022-04-20 ENCOUNTER — Telehealth: Payer: Self-pay

## 2022-04-20 NOTE — Telephone Encounter (Signed)
Maria Powers had a SI joint fusion 01/20/2022. She is just getting back to work and her funds are too low for an appointment. Patient wanted to know if you will send in a steroid for right wrist pain?  Call back phone [phone].

## 2022-04-21 NOTE — Telephone Encounter (Signed)
Patient informed. 

## 2022-06-11 ENCOUNTER — Ambulatory Visit: Payer: Managed Care, Other (non HMO) | Admitting: Obstetrics and Gynecology

## 2022-07-13 ENCOUNTER — Other Ambulatory Visit: Payer: Self-pay | Admitting: Physical Medicine & Rehabilitation

## 2022-09-09 ENCOUNTER — Other Ambulatory Visit: Payer: Self-pay | Admitting: Physical Medicine & Rehabilitation

## 2022-10-07 ENCOUNTER — Encounter: Payer: Self-pay | Admitting: Family Medicine

## 2022-10-08 ENCOUNTER — Other Ambulatory Visit: Payer: Self-pay | Admitting: Internal Medicine

## 2022-10-08 DIAGNOSIS — K649 Unspecified hemorrhoids: Secondary | ICD-10-CM

## 2022-10-08 MED ORDER — HYDROCORTISONE (PERIANAL) 2.5 % EX CREA: 1.0000 | TOPICAL_CREAM | Freq: Two times a day (BID) | CUTANEOUS | 2 refills | Status: AC

## 2022-10-08 NOTE — Telephone Encounter (Signed)
Disp Refills Start End   hydrocortisone (PROCTOSOL HC) 2.5 % rectal cream (Discontinued) 30 g 0 11/24/2016 01/18/2017   Sig - Route: Place 1 application rectally 2 (two) times daily. - Rectal    Can you advise if okay to refill in pcp's absence?

## 2022-10-20 NOTE — Progress Notes (Signed)
47 y.o. [id number] Single Caucasian female here for annual exam.  Pt is sick with upper respiratory, BP was taken twice.  She saw her PCP today regarding her respiratory infection.   Had a long period in November, 2023 which lasted one month.  No bleeding since then.   Planning on a new Mirena IUD.   Declines STD screening.   Working at [employer], training for Education administrator.   PCP:   Betty Martinique, MD Dr. Lyanne Co  No LMP recorded. (Menstrual status: IUD).    Pt last had one in November       Sexually active: No.  The current method of family planning is IUD--Mirena 06/21/15.   Due for removal in Sept. 2024. Exercising: No.     Smoker:  no  Health Maintenance: Pap:  02/22/20 neg: HR HPV neg, 07-12-14 Ascus:neg, 11-24-11 Neg  History of abnormal Pap:  no MMG:  n/a.  Information give to patient about mammogram facilities in [place].  Colonoscopy:  n/a.  She is planning referral through her PCP.  BMD:   n/a  Result  n/a TDaP:  07/12/14 Gardasil:   no HIV: neg in past Hep C: no Screening Labs:  PCP   reports that she has quit smoking. She has never used smokeless tobacco. She reports that she does not drink alcohol and does not use drugs.  Past Medical History:  Diagnosis Date   Anxiety    Bipolar 1 disorder ([place])    Chronic pain    back, hip - seeing PMR   Depression    Dysmenorrhea    Hypertension    Obesity     Past Surgical History:  Procedure Laterality Date   KIDNEY STONE SURGERY  09/21/2005   KNEE ARTHROSCOPY Right 09/21/2001   KNEE ARTHROSCOPY Right 09/21/2005   SACROILIAC JOINT FUSION  01/20/2022   SHOULDER ARTHROSCOPY Right 09/21/2005   WISDOM TOOTH EXTRACTION  Highschool    Current Outpatient Medications  Medication Sig Dispense Refill   cyclobenzaprine (FLEXERIL) 10 MG tablet TAKE ONE TABLET BY MOUTH THREE TIMES A DAY 270 tablet 1   gabapentin (NEURONTIN) 600 MG tablet TAKE 1/2 TABLET BY MOUTH THREE TIMES A DAY 45 tablet 2   hydrocortisone  (ANUSOL-HC) 2.5 % rectal cream Place 1 Application rectally 2 (two) times daily. 30 g 2   levonorgestrel (MIRENA) 20 MCG/24HR IUD 1 each by Intrauterine route once.     meloxicam (MOBIC) 15 MG tablet TAKE ONE TABLET BY MOUTH DAILY 30 tablet 3   Multiple Vitamin (MULTIVITAMIN WITH MINERALS) TABS tablet Take 1 tablet by mouth daily.     predniSONE (DELTASONE) 20 MG tablet Take 1 tablet (20 mg total) by mouth daily with breakfast for 3 days. 3 tablet 0   rizatriptan (MAXALT-MLT) 5 MG disintegrating tablet Take 1 tablet (5 mg total) by mouth as needed. 10 tablet 0   traMADol (ULTRAM) 50 MG tablet TAKE ONE TABLET BY MOUTH FOUR TIMES A DAY 120 tablet 5   No current facility-administered medications for this visit.    Family History  Problem Relation Age of Onset   Addison's disease Mother    Diabetes Mother    Bipolar disorder Mother    Fibroids Maternal Aunt     Review of Systems  All other systems reviewed and are negative.   Exam:   BP (!) 138/102 Comment: Pt is currently sick with unknown illness  Pulse 97   Ht 5' 5"$  (1.651 m)   Wt 238 lb (108 kg)  SpO2 98%   BMI 39.61 kg/m     General appearance: alert, cooperative and appears stated age Head: normocephalic, without obvious abnormality, atraumatic Neck: no adenopathy, supple, symmetrical, trachea midline and thyroid normal to inspection and palpation Lungs: clear to auscultation bilaterally Breasts: normal appearance, no masses or tenderness, No nipple retraction or dimpling, No nipple discharge or bleeding, No axillary adenopathy Heart: regular rate and rhythm Abdomen: soft, non-tender; no masses, no organomegaly Extremities: extremities normal, atraumatic, no cyanosis or edema Skin: skin color, texture, turgor normal. No rashes or lesions Lymph nodes: cervical, supraclavicular, and axillary nodes normal. Neurologic: grossly normal  Pelvic: External genitalia:  no lesions              No abnormal inguinal nodes  palpated.              Urethra:  normal appearing urethra with no masses, tenderness or lesions              Bartholins and Skenes: normal                 Vagina: normal appearing vagina with normal color and discharge, no lesions              Cervix: no lesions.  IUD strings noted.               Pap taken: no Bimanual Exam:  Uterus:  normal size, contour, position, consistency, mobility, non-tender              Adnexa: no mass, fullness, tenderness              Rectal exam: yes.  Confirms.              Anus:  normal sphincter tone, no lesions  Chaperone was present for exam:  Santiago Glad  Assessment:   Well woman visit with gynecologic exam. Current respiratory illness. Mirena IUD.  Hx ASCUS pap and neg HR HPV.  Follow up pap normal and neg HR HVP. Hx migraine HA.   Plan: Mammogram screening discussed. Self breast awareness reviewed. Pap and HR HPV 2026. Guidelines for Calcium, Vitamin D, regular exercise program including cardiovascular and weight bearing exercise. Return for IUD exchange.  Follow up annually and prn.   After visit summary provided.

## 2022-11-02 NOTE — Progress Notes (Unsigned)
HPI: [name] is a 47 y.o. female, who is here today for her routine physical.  Last CPE: 09/29/21  Regular exercise 3 or more time per week: *** Following a healthy diet: ***  Chronic medical problems: ***  Immunization History  Administered Date(s) Administered  . Influenza Split 08/10/2012  . Influenza,inj,Quad PF,6+ Mos 07/10/2013, 07/05/2014, 07/24/2015, 07/17/2017, 07/06/2018, 06/09/2019, 06/09/2019  . Influenza-Unspecified 07/26/2016, 07/06/2018, 07/24/2020, 06/07/2021  . PFIZER(Purple Top)SARS-COV-2 Vaccination 12/14/2019, 01/09/2020  . Pension scheme manager 47yr & up 06/07/2021  . Tdap 07/12/2014   Health Maintenance  Topic Date Due  . COLONOSCOPY (Pts 45-472yrInsurance coverage will need to be confirmed)  Never done  . COVID-19 Vaccine (4 - 2023-24 season) 05/22/2022  . HIV Screening  07/23/2025 (Originally 01/20/1991)  . PAP SMEAR-Modifier  02/22/2023  . DTaP/Tdap/Td (2 - Td or Tdap) 07/12/2024  . INFLUENZA VACCINE  Completed  . Hepatitis C Screening  Completed  . HPV VACCINES  Aged Out    She has *** concerns today.  Review of Systems  Current Outpatient Medications on File Prior to Visit  Medication Sig Dispense Refill  . hydrocortisone (ANUSOL-HC) 2.5 % rectal cream Place 1 Application rectally 2 (two) times daily. 30 g 2  . cyclobenzaprine (FLEXERIL) 10 MG tablet TAKE ONE TABLET BY MOUTH THREE TIMES A DAY 270 tablet 1  . Daridorexant HCl (QUVIVIQ) 25 MG TABS Take 1 tablet by mouth at bedtime as needed. 30 tablet 0  . doxepin (SINEQUAN) 10 MG capsule TAKE 1-2 CAPSULES BY MOUTH EVERY NIGHT AT BEDTIME 30 capsule 0  . gabapentin (NEURONTIN) 600 MG tablet TAKE 1/2 TABLET BY MOUTH THREE TIMES A DAY 45 tablet 2  . levonorgestrel (MIRENA) 20 MCG/24HR IUD 1 each by Intrauterine route once.    . meloxicam (MOBIC) 15 MG tablet TAKE ONE TABLET BY MOUTH DAILY 30 tablet 3  . Multiple Vitamin (MULTIVITAMIN WITH MINERALS) TABS tablet Take 1  tablet by mouth daily.    . rizatriptan (MAXALT-MLT) 5 MG disintegrating tablet Take 1 tablet (5 mg total) by mouth as needed. 10 tablet 0  . traMADol (ULTRAM) 50 MG tablet TAKE ONE TABLET BY MOUTH FOUR TIMES A DAY 120 tablet 5   No current facility-administered medications on file prior to visit.    Past Medical History:  Diagnosis Date  . Anxiety   . Bipolar 1 disorder ([place]  . Chronic pain    back, hip - seeing PMR  . Depression   . Dysmenorrhea   . Hypertension   . Obesity     Past Surgical History:  Procedure Laterality Date  . KIDNEY STONE SURGERY  09/21/2005  . KNEE ARTHROSCOPY Right 09/21/2001  . KNEE ARTHROSCOPY Right 09/21/2005  . SACROILIAC JOINT FUSION  01/20/2022  . SHOULDER ARTHROSCOPY Right 09/21/2005  . WISDOM TOOTH EXTRACTION  Highschool    Allergies  Allergen Reactions  . Clindamycin/Lincomycin Nausea And Vomiting  . Nubain [Nalbuphine Hcl] Other (See Comments)    *feels like something crawling on her*    Family History  Problem Relation Age of Onset  . Addison's disease Mother   . Diabetes Mother   . Bipolar disorder Mother   . Fibroids Maternal Aunt     Social History   Socioeconomic History  . Marital status: Single    Spouse name: Not on file  . Number of children: Not on file  . Years of education: Not on file  . Highest education level: Not on file  Occupational  History  . Not on file  Tobacco Use  . Smoking status: Former  . Smokeless tobacco: Never  Vaping Use  . Vaping Use: Never used  Substance and Sexual Activity  . Alcohol use: No    Alcohol/week: 0.0 standard drinks of alcohol  . Drug use: No  . Sexual activity: Not Currently    Partners: Male    Birth control/protection: Abstinence, I.U.D.    Comment: Mirena IUD inserted 06-21-15  Other Topics Concern  . Not on file  Social History Narrative   Work or School: Armed forces technical officer      Home Situation: roommate      Spiritual Beliefs:mormon       Lifestyle: no regular exercise; horrible diet            Social Determinants of Health   Financial Resource Strain: Not on file  Food Insecurity: Not on file  Transportation Needs: Not on file  Physical Activity: Not on file  Stress: Not on file  Social Connections: Not on file    There were no vitals filed for this visit. There is no height or weight on file to calculate BMI.  Wt Readings from Last 3 Encounters:  03/04/22 234 lb 2 oz (106.2 kg)  09/29/21 239 lb 8 oz (108.6 kg)  08/29/21 238 lb (108 kg)    Physical Exam Vitals and nursing note reviewed.  Constitutional:      General: She is not in acute distress.    Appearance: She is well-developed.  HENT:     Head: Normocephalic and atraumatic.     Right Ear: Hearing, tympanic membrane, ear canal and external ear normal.     Left Ear: Hearing, tympanic membrane, ear canal and external ear normal.     Mouth/Throat:     Mouth: Mucous membranes are moist.     Pharynx: Oropharynx is clear. Uvula midline.  Eyes:     Extraocular Movements: Extraocular movements intact.     Conjunctiva/sclera: Conjunctivae normal.     Pupils: Pupils are equal, round, and reactive to light.  Neck:     Thyroid: No thyromegaly.     Trachea: No tracheal deviation.  Cardiovascular:     Rate and Rhythm: Normal rate and regular rhythm.     Pulses:          Dorsalis pedis pulses are 2+ on the right side and 2+ on the left side.       Posterior tibial pulses are 2+ on the right side and 2+ on the left side.     Heart sounds: No murmur heard. Pulmonary:     Effort: Pulmonary effort is normal. No respiratory distress.     Breath sounds: Normal breath sounds.  Abdominal:     Palpations: Abdomen is soft. There is no hepatomegaly or mass.     Tenderness: There is no abdominal tenderness.  Genitourinary:    Comments: Deferred to gyn. Musculoskeletal:     Comments: No major deformity or signs of synovitis appreciated.  Lymphadenopathy:      Cervical: No cervical adenopathy.     Upper Body:     Right upper body: No supraclavicular adenopathy.     Left upper body: No supraclavicular adenopathy.  Skin:    General: Skin is warm.     Findings: No erythema or rash.  Neurological:     General: No focal deficit present.     Mental Status: She is alert and oriented to person, place, and time.  Cranial Nerves: No cranial nerve deficit.     Coordination: Coordination normal.     Gait: Gait normal.     Deep Tendon Reflexes:     Reflex Scores:      Bicep reflexes are 2+ on the right side and 2+ on the left side.      Patellar reflexes are 2+ on the right side and 2+ on the left side. Psychiatric:     Comments: Well groomed, good eye contact.   ASSESSMENT AND PLAN: Ms. Maria Powers was here today annual physical examination.  No orders of the defined types were placed in this encounter.   There are no diagnoses linked to this encounter.  There are no diagnoses linked to this encounter.  No follow-ups on file.  Keiva Dina G. Martinique, MD  [facility]. [place] office.

## 2022-11-03 ENCOUNTER — Ambulatory Visit (INDEPENDENT_AMBULATORY_CARE_PROVIDER_SITE_OTHER): Payer: Managed Care, Other (non HMO) | Admitting: Obstetrics and Gynecology

## 2022-11-03 ENCOUNTER — Ambulatory Visit: Payer: Managed Care, Other (non HMO) | Admitting: Family Medicine

## 2022-11-03 ENCOUNTER — Encounter: Payer: Self-pay | Admitting: Obstetrics and Gynecology

## 2022-11-03 ENCOUNTER — Encounter: Payer: Self-pay | Admitting: Family Medicine

## 2022-11-03 VITALS — BP 138/102 | HR 97 | Ht 65.0 in | Wt 238.0 lb

## 2022-11-03 VITALS — BP 128/80 | HR 91 | Temp 98.2°F | Resp 12 | Ht 65.0 in | Wt 239.4 lb

## 2022-11-03 DIAGNOSIS — Z01419 Encounter for gynecological examination (general) (routine) without abnormal findings: Secondary | ICD-10-CM | POA: Diagnosis not present

## 2022-11-03 DIAGNOSIS — Z1329 Encounter for screening for other suspected endocrine disorder: Secondary | ICD-10-CM

## 2022-11-03 DIAGNOSIS — J069 Acute upper respiratory infection, unspecified: Secondary | ICD-10-CM | POA: Diagnosis not present

## 2022-11-03 DIAGNOSIS — G4700 Insomnia, unspecified: Secondary | ICD-10-CM

## 2022-11-03 DIAGNOSIS — I10 Essential (primary) hypertension: Secondary | ICD-10-CM

## 2022-11-03 DIAGNOSIS — Z130 Encounter for screening for diseases of the blood and blood-forming organs and certain disorders involving the immune mechanism: Secondary | ICD-10-CM | POA: Diagnosis not present

## 2022-11-03 DIAGNOSIS — Z13228 Encounter for screening for other metabolic disorders: Secondary | ICD-10-CM

## 2022-11-03 DIAGNOSIS — Z6839 Body mass index (BMI) 39.0-39.9, adult: Secondary | ICD-10-CM

## 2022-11-03 DIAGNOSIS — Z13220 Encounter for screening for lipoid disorders: Secondary | ICD-10-CM | POA: Diagnosis not present

## 2022-11-03 DIAGNOSIS — R0981 Nasal congestion: Secondary | ICD-10-CM | POA: Diagnosis not present

## 2022-11-03 DIAGNOSIS — Z0000 Encounter for general adult medical examination without abnormal findings: Secondary | ICD-10-CM | POA: Diagnosis not present

## 2022-11-03 DIAGNOSIS — Z308 Encounter for other contraceptive management: Secondary | ICD-10-CM | POA: Diagnosis not present

## 2022-11-03 DIAGNOSIS — F319 Bipolar disorder, unspecified: Secondary | ICD-10-CM

## 2022-11-03 LAB — LIPID PANEL
Cholesterol: 193 mg/dL (ref 0–200)
HDL: 56.3 mg/dL (ref >39.00)
LDL Cholesterol: 121 mg/dL — ABNORMAL HIGH (ref 0–99)
NonHDL: 136.94
Total CHOL/HDL Ratio: 3
Triglycerides: 78 mg/dL (ref 0.0–149.0)
VLDL: 15.6 mg/dL (ref 0.0–40.0)

## 2022-11-03 LAB — COMPREHENSIVE METABOLIC PANEL
ALT: 20 U/L (ref 0–35)
AST: 19 U/L (ref 0–37)
Albumin: 4.2 g/dL (ref 3.5–5.2)
Alkaline Phosphatase: 51 U/L (ref 39–117)
BUN: 17 mg/dL (ref 6–23)
CO2: 24 mEq/L (ref 19–32)
Calcium: 9.3 mg/dL (ref 8.4–10.5)
Chloride: 106 mEq/L (ref 96–112)
Creatinine, Ser: 0.78 mg/dL (ref 0.40–1.20)
GFR: 91.49 mL/min (ref >60.00)
Glucose, Bld: 90 mg/dL (ref 70–99)
Potassium: 3.9 mEq/L (ref 3.5–5.1)
Sodium: 139 mEq/L (ref 135–145)
Total Bilirubin: 0.6 mg/dL (ref 0.2–1.2)
Total Protein: 7 g/dL (ref 6.0–8.3)

## 2022-11-03 LAB — HEMOGLOBIN A1C: Hgb A1c MFr Bld: 5.9 % (ref 4.6–6.5)

## 2022-11-03 LAB — POC COVID19 BINAXNOW: SARS Coronavirus 2 Ag: NEGATIVE

## 2022-11-03 MED ORDER — PREDNISONE 20 MG PO TABS: 20.0000 mg | ORAL_TABLET | Freq: Every day | ORAL | 0 refills | Status: AC

## 2022-11-03 NOTE — Patient Instructions (Addendum)
A few things to remember from today's visit:  Routine general medical examination at a health care facility  Essential hypertension, benign - Plan: CMP  Screening for lipoid disorders - Plan: Lipid Panel  URI, acute  Screening for endocrine, metabolic and immunity disorder - Plan: Hemoglobin A1c  Sinus congestion - Plan: predniSONE (DELTASONE) 20 MG tablet  Respiratory symptoms reported today suggest a viral infection, so symptomatic treatment is recommended. COVID 19 test done today is negative. Symptomatic treatment with nasal saline irrigations, voice rest,Flonase nasal spray,and tylenol 500 mg 3-4 times as needed. Monitor for new symptoms. I sent prednisone to your pharmacy to take for 3 days of sinus pressure is persistent. Let me know if you are still having symptoms in 10-14 days, before of fever.  If you need refills for medications you take chronically, please call your pharmacy. Do not use My Chart to request refills or for acute issues that need immediate attention. If you send a my chart message, it may take a few days to be addressed, specially if I am not in the office.  Please be sure medication list is accurate. If a new problem present, please set up appointment sooner than planned today.  Health Maintenance, Female Adopting a healthy lifestyle and getting preventive care are important in promoting health and wellness. Ask your health care provider about: The right schedule for you to have regular tests and exams. Things you can do on your own to prevent diseases and keep yourself healthy. What should I know about diet, weight, and exercise? Eat a healthy diet  Eat a diet that includes plenty of vegetables, fruits, low-fat dairy products, and lean protein. Do not eat a lot of foods that are high in solid fats, added sugars, or sodium. Maintain a healthy weight Body mass index (BMI) is used to identify weight problems. It estimates body fat based on height and  weight. Your health care provider can help determine your BMI and help you achieve or maintain a healthy weight. Get regular exercise Get regular exercise. This is one of the most important things you can do for your health. Most adults should: Exercise for at least 150 minutes each week. The exercise should increase your heart rate and make you sweat (moderate-intensity exercise). Do strengthening exercises at least twice a week. This is in addition to the moderate-intensity exercise. Spend less time sitting. Even light physical activity can be beneficial. Watch cholesterol and blood lipids Have your blood tested for lipids and cholesterol at 46 years of age, then have this test every 5 years. Have your cholesterol levels checked more often if: Your lipid or cholesterol levels are high. You are older than 46 years of age. You are at high risk for heart disease. What should I know about cancer screening? Depending on your health history and family history, you may need to have cancer screening at various ages. This may include screening for: Breast cancer. Cervical cancer. Colorectal cancer. Skin cancer. Lung cancer. What should I know about heart disease, diabetes, and high blood pressure? Blood pressure and heart disease High blood pressure causes heart disease and increases the risk of stroke. This is more likely to develop in people who have high blood pressure readings or are overweight. Have your blood pressure checked: Every 3-5 years if you are 57-31 years of age. Every year if you are 23 years old or older. Diabetes Have regular diabetes screenings. This checks your fasting blood sugar level. Have the screening done: Once  every three years after age 70 if you are at a normal weight and have a low risk for diabetes. More often and at a younger age if you are overweight or have a high risk for diabetes. What should I know about preventing infection? Hepatitis B If you have a  higher risk for hepatitis B, you should be screened for this virus. Talk with your health care provider to find out if you are at risk for hepatitis B infection. Hepatitis C Testing is recommended for: Everyone born from [birth year] through 1964. Anyone with known risk factors for hepatitis C. Sexually transmitted infections (STIs) Get screened for STIs, including gonorrhea and chlamydia, if: You are sexually active and are younger than 46 years of age. You are older than 46 years of age and your health care provider tells you that you are at risk for this type of infection. Your sexual activity has changed since you were last screened, and you are at increased risk for chlamydia or gonorrhea. Ask your health care provider if you are at risk. Ask your health care provider about whether you are at high risk for HIV. Your health care provider may recommend a prescription medicine to help prevent HIV infection. If you choose to take medicine to prevent HIV, you should first get tested for HIV. You should then be tested every 3 months for as long as you are taking the medicine. Pregnancy If you are about to stop having your period (premenopausal) and you may become pregnant, seek counseling before you get pregnant. Take 400 to 800 micrograms (mcg) of folic acid every day if you become pregnant. Ask for birth control (contraception) if you want to prevent pregnancy. Osteoporosis and menopause Osteoporosis is a disease in which the bones lose minerals and strength with aging. This can result in bone fractures. If you are 58 years old or older, or if you are at risk for osteoporosis and fractures, ask your health care provider if you should: Be screened for bone loss. Take a calcium or vitamin D supplement to lower your risk of fractures. Be given hormone replacement therapy (HRT) to treat symptoms of menopause. Follow these instructions at home: Alcohol use Do not drink alcohol if: Your health care  provider tells you not to drink. You are pregnant, may be pregnant, or are planning to become pregnant. If you drink alcohol: Limit how much you have to: 0-1 drink a day. Know how much alcohol is in your drink. In the [place] one drink equals one 12 oz bottle of beer (355 mL), one 5 oz glass of wine (148 mL), or one 1 oz glass of hard liquor (44 mL). Lifestyle Do not use any products that contain nicotine or tobacco. These products include cigarettes, chewing tobacco, and vaping devices, such as e-cigarettes. If you need help quitting, ask your health care provider. Do not use street drugs. Do not share needles. Ask your health care provider for help if you need support or information about quitting drugs. General instructions Schedule regular health, dental, and eye exams. Stay current with your vaccines. Tell your health care provider if: You often feel depressed. You have ever been abused or do not feel safe at home. Summary Adopting a healthy lifestyle and getting preventive care are important in promoting health and wellness. Follow your health care provider's instructions about healthy diet, exercising, and getting tested or screened for diseases. Follow your health care provider's instructions on monitoring your cholesterol and blood pressure. This information is  not intended to replace advice given to you by your health care provider. Make sure you discuss any questions you have with your health care provider. Document Revised: 01/27/2021 Document Reviewed: 01/27/2021 Elsevier Patient Education  [place].

## 2022-11-03 NOTE — Assessment & Plan Note (Signed)
BP has been adequately controlled on nonpharmacologic treatment. We will continue following annually, before if needed.

## 2022-11-03 NOTE — Patient Instructions (Signed)

## 2022-11-03 NOTE — Assessment & Plan Note (Signed)
We discussed the importance of regular physical activity and healthy diet for prevention of chronic illness and/or complications. Preventive guidelines reviewed. Vaccination up to date. She is overdue for mammogram. She has an appt with her gyn today. Declined colon cancer screening, would like to check with her health insurance. Next CPE in a year.

## 2022-11-08 ENCOUNTER — Other Ambulatory Visit: Payer: Self-pay | Admitting: Physical Medicine & Rehabilitation

## 2022-11-09 ENCOUNTER — Telehealth: Payer: Managed Care, Other (non HMO) | Admitting: Family Medicine

## 2022-11-09 ENCOUNTER — Encounter: Payer: Self-pay | Admitting: Family Medicine

## 2022-11-09 VITALS — Ht 65.0 in

## 2022-11-09 DIAGNOSIS — J0100 Acute maxillary sinusitis, unspecified: Secondary | ICD-10-CM | POA: Diagnosis not present

## 2022-11-09 DIAGNOSIS — J302 Other seasonal allergic rhinitis: Secondary | ICD-10-CM

## 2022-11-09 MED ORDER — AMOXICILLIN-POT CLAVULANATE 875-125 MG PO TABS: 1.0000 | ORAL_TABLET | Freq: Two times a day (BID) | ORAL | 0 refills | Status: DC

## 2022-11-09 NOTE — Progress Notes (Signed)
Virtual Visit via Video Note I connected with Maria Powers on 11/09/22 by a video enabled telemedicine application and verified that I am speaking with the correct person using two identifiers. Location patient: [facility] office Persons participating in the virtual visit: patient, provider  I discussed the limitations of evaluation and management by telemedicine and the availability of in person appointments. The patient expressed understanding and agreed to proceed.  Chief Complaint  Patient presents with   Sinus Problem   HPI: Ms. Maria Powers is a 47 years old female with past medical history significant for migraine headaches, chronic back pain, prediabetes, insomnia, HTN,and bipolar disorder complaining of sinus problems, including facial pain and nasal congestion. She reports that it has been over seven days since she started feeling unwell. She has a history of seasonal allergies and has been using Flonase and allergy medications to manage her symptoms.   She was seen on 09/03/2023 for CPE, when she reported acute URI symptoms, prednisone helped in the past, so recommended short course (3 days). She mentions that her voice has improved with prednisone, but she continues to experience significant frontal headache and facial pressure.  She describes the facial pain extending downward and causing discomfort in her upper jaw. She suspects she may have a sinus infection.   She denies having fever or chills and reports that her rhinorrhea has improved today, although she experienced nasal congestion throughout the weekend, unable to breathe through her nose. Negative for visual changes, conjunctival erythema, sore throat, cough, wheezing, dyspnea, abdominal pain, nausea, vomiting, changes in bowel habits, body aches, or a skin rash.  The patient has been using Sudafed since Friday to manage her symptoms but has not been performing nasal saline irrigations. She has a history  of sinus infections, which she describes as causing similar pain and pressure in her face.  ROS: See pertinent positives and negatives per HPI.  Past Medical History:  Diagnosis Date   Anxiety    Bipolar 1 disorder (HCC)    Chronic pain    back, hip - seeing PMR   Depression    Dysmenorrhea    Hypertension    Obesity    Past Surgical History:  Procedure Laterality Date   KIDNEY STONE SURGERY  09/21/2005   KNEE ARTHROSCOPY Right 09/21/2001   KNEE ARTHROSCOPY Right 09/21/2005   SACROILIAC JOINT FUSION  01/20/2022   SHOULDER ARTHROSCOPY Right 09/21/2005   WISDOM TOOTH EXTRACTION  Highschool   Family History  Problem Relation Age of Onset   Addison's disease Mother    Diabetes Mother    Bipolar disorder Mother    Fibroids Maternal Aunt    Social History   Socioeconomic History   Marital status: Single    Spouse name: Not on file   Number of children: Not on file   Years of education: Not on file   Highest education level: Not on file  Occupational History   Not on file  Tobacco Use   Smoking status: Former   Smokeless tobacco: Never  Vaping Use   Vaping Use: Never used  Substance and Sexual Activity   Alcohol use: No    Alcohol/week: 0.0 standard drinks of alcohol   Drug use: No   Sexual activity: Not Currently    Partners: Male    Birth control/protection: Abstinence, I.U.D.    Comment: Mirena IUD inserted 06-21-15  Other Topics Concern   Not on file  Social History Narrative   Work or School: Armed forces technical officer  Home Situation: roommate      Spiritual Beliefs:mormon      Lifestyle: no regular exercise; horrible diet            Social Determinants of Health   Financial Resource Strain: Not on file  Food Insecurity: Not on file  Transportation Needs: Not on file  Physical Activity: Not on file  Stress: Not on file  Social Connections: Not on file  Intimate Partner Violence: Not on file     Current Outpatient Medications:     amoxicillin-clavulanate (AUGMENTIN) 875-125 MG tablet, Take 1 tablet by mouth 2 (two) times daily., Disp: 20 tablet, Rfl: 0   cyclobenzaprine (FLEXERIL) 10 MG tablet, TAKE ONE TABLET BY MOUTH THREE TIMES A DAY, Disp: 270 tablet, Rfl: 1   gabapentin (NEURONTIN) 600 MG tablet, TAKE 1/2 TABLET BY MOUTH THREE TIMES A DAY, Disp: 45 tablet, Rfl: 2   hydrocortisone (ANUSOL-HC) 2.5 % rectal cream, Place 1 Application rectally 2 (two) times daily., Disp: 30 g, Rfl: 2   levonorgestrel (MIRENA) 20 MCG/24HR IUD, 1 each by Intrauterine route once., Disp: , Rfl:    meloxicam (MOBIC) 15 MG tablet, TAKE ONE TABLET BY MOUTH DAILY, Disp: 30 tablet, Rfl: 3   Multiple Vitamin (MULTIVITAMIN WITH MINERALS) TABS tablet, Take 1 tablet by mouth daily., Disp: , Rfl:    rizatriptan (MAXALT-MLT) 5 MG disintegrating tablet, Take 1 tablet (5 mg total) by mouth as needed., Disp: 10 tablet, Rfl: 0   traMADol (ULTRAM) 50 MG tablet, TAKE ONE TABLET BY MOUTH FOUR TIMES A DAY, Disp: 120 tablet, Rfl: 5  EXAM:  VITALS per patient if applicable:Ht 5' 5"$  (1.651 m)   BMI 39.61 kg/m   GENERAL: alert, oriented, appears well and in no acute distress  HEENT: atraumatic, conjunctiva clear, no obvious abnormalities on inspection of external nose and ears Reports pain when she presses on maxillary sinus area, bilateral.  NECK: normal movements of the head and neck  LUNGS: on inspection no signs of respiratory distress, breathing rate appears normal, no obvious gross SOB, gasping or wheezing  CV: no obvious cyanosis  MS: moves all visible extremities without noticeable abnormality  PSYCH/NEURO: pleasant and cooperative, no obvious depression, + anxious. Speech and thought processing grossly intact  ASSESSMENT AND PLAN:  Discussed the following assessment and plan:  Acute non-recurrent maxillary sinusitis - Plan: amoxicillin-clavulanate (AUGMENTIN) 875-125 MG tablet We discussed possible etiologies, including allergies and  postviral syndrome. She reported the problem is getting worse. Explained that antibiotic would not help if there is not a bacterial process. Augmentin 875-125 mg twice daily x 10 days recommended. We discussed some side effects of antibiotics. If problem does not resolve, she may need maxillary/sinus imaging.  Seasonal allergic rhinitis, unspecified trigger This probably could be a contributing factor. Continue Flonase nasal spray daily as needed. Start nasal saline irrigations as needed.  We discussed possible serious and likely etiologies, options for evaluation and workup, limitations of telemedicine visit vs in person visit, treatment, treatment risks and precautions. The patient was advised to call back or seek an in-person evaluation if the symptoms worsen or if the condition fails to improve as anticipated. I discussed the assessment and treatment plan with the patient. The patient was provided an opportunity to ask questions and all were answered. The patient agreed with the plan and demonstrated an understanding of the instructions.  Return if symptoms worsen or fail to improve.  Meleena Munroe G. Martinique, MD  [facility]. [place] office.

## 2022-11-19 ENCOUNTER — Encounter: Payer: Self-pay | Admitting: Family Medicine

## 2022-11-20 ENCOUNTER — Other Ambulatory Visit: Payer: Self-pay | Admitting: Physical Medicine & Rehabilitation

## 2022-11-23 ENCOUNTER — Other Ambulatory Visit: Payer: Self-pay | Admitting: Family Medicine

## 2022-11-23 DIAGNOSIS — J302 Other seasonal allergic rhinitis: Secondary | ICD-10-CM

## 2022-11-23 MED ORDER — FLUTICASONE PROPIONATE 50 MCG/ACT NA SUSP: 2.0000 | Freq: Every day | NASAL | 3 refills | Status: DC | PRN

## 2022-11-26 ENCOUNTER — Encounter: Payer: Self-pay | Admitting: Physical Medicine & Rehabilitation

## 2022-11-26 ENCOUNTER — Encounter
Payer: Managed Care, Other (non HMO) | Attending: Physical Medicine & Rehabilitation | Admitting: Physical Medicine & Rehabilitation

## 2022-11-26 VITALS — BP 157/103 | HR 79 | Ht 65.0 in | Wt 241.0 lb

## 2022-11-26 DIAGNOSIS — G894 Chronic pain syndrome: Secondary | ICD-10-CM | POA: Diagnosis not present

## 2022-11-26 DIAGNOSIS — Z79891 Long term (current) use of opiate analgesic: Secondary | ICD-10-CM

## 2022-11-26 DIAGNOSIS — Z5181 Encounter for therapeutic drug level monitoring: Secondary | ICD-10-CM | POA: Diagnosis not present

## 2022-11-26 MED ORDER — TRAMADOL HCL 50 MG PO TABS: 50.0000 mg | ORAL_TABLET | Freq: Three times a day (TID) | ORAL | 5 refills | Status: DC

## 2022-11-26 NOTE — Patient Instructions (Signed)
Back Exercises These exercises help to make your trunk and back strong. They also help to keep the lower back flexible. Doing these exercises can help to prevent or lessen pain in your lower back. If you have back pain, try to do these exercises 2-3 times each day or as told by your doctor. As you get better, do the exercises once each day. Repeat the exercises more often as told by your doctor. To stop back pain from coming back, do the exercises once each day, or as told by your doctor. Do exercises exactly as told by your doctor. Stop right away if you feel sudden pain or your pain gets worse. Exercises Single knee to chest Do these steps 3-5 times in a row for each leg: Lie on your back on a firm bed or the floor with your legs stretched out. Bring one knee to your chest. Grab your knee or thigh with both hands and hold it in place. Pull on your knee until you feel a gentle stretch in your lower back or butt. Keep doing the stretch for 10-30 seconds. Slowly let go of your leg and straighten it. Pelvic tilt Do these steps 5-10 times in a row: Lie on your back on a firm bed or the floor with your legs stretched out. Bend your knees so they point up to the ceiling. Your feet should be flat on the floor. Tighten your lower belly (abdomen) muscles to press your lower back against the floor. This will make your tailbone point up to the ceiling instead of pointing down to your feet or the floor. Stay in this position for 5-10 seconds while you gently tighten your muscles and breathe evenly. Cat-cow Do these steps until your lower back bends more easily: Get on your hands and knees on a firm bed or the floor. Keep your hands under your shoulders, and keep your knees under your hips. You may put padding under your knees. Let your head hang down toward your chest. Tighten (contract) the muscles in your belly. Point your tailbone toward the floor so your lower back becomes rounded like the back of a  cat. Stay in this position for 5 seconds. Slowly lift your head. Let the muscles of your belly relax. Point your tailbone up toward the ceiling so your back forms a sagging arch like the back of a cow. Stay in this position for 5 seconds.  Press-ups Do these steps 5-10 times in a row: Lie on your belly (face-down) on a firm bed or the floor. Place your hands near your head, about shoulder-width apart. While you keep your back relaxed and keep your hips on the floor, slowly straighten your arms to raise the top half of your body and lift your shoulders. Do not use your back muscles. You may change where you place your hands to make yourself more comfortable. Stay in this position for 5 seconds. Keep your back relaxed. Slowly return to lying flat on the floor.  Bridges Do these steps 10 times in a row: Lie on your back on a firm bed or the floor. Bend your knees so they point up to the ceiling. Your feet should be flat on the floor. Your arms should be flat at your sides, next to your body. Tighten your butt muscles and lift your butt off the floor until your waist is almost as high as your knees. If you do not feel the muscles working in your butt and the back of  your thighs, slide your feet 1-2 inches (2.5-5 cm) farther away from your butt. Stay in this position for 3-5 seconds. Slowly lower your butt to the floor, and let your butt muscles relax. If this exercise is too easy, try doing it with your arms crossed over your chest. Belly crunches Do these steps 5-10 times in a row: Lie on your back on a firm bed or the floor with your legs stretched out. Bend your knees so they point up to the ceiling. Your feet should be flat on the floor. Cross your arms over your chest. Tip your chin a little bit toward your chest, but do not bend your neck. Tighten your belly muscles and slowly raise your chest just enough to lift your shoulder blades a tiny bit off the floor. Avoid raising your body  higher than that because it can put too much stress on your lower back. Slowly lower your chest and your head to the floor. Back lifts Do these steps 5-10 times in a row: Lie on your belly (face-down) with your arms at your sides, and rest your forehead on the floor. Tighten the muscles in your legs and your butt. Slowly lift your chest off the floor while you keep your hips on the floor. Keep the back of your head in line with the curve in your back. Look at the floor while you do this. Stay in this position for 3-5 seconds. Slowly lower your chest and your face to the floor. Contact a doctor if: Your back pain gets a lot worse when you do an exercise. Your back pain does not get better within 2 hours after you exercise. If you have any of these problems, stop doing the exercises. Do not do them again unless your doctor says it is okay. Get help right away if: You have sudden, very bad back pain. If this happens, stop doing the exercises. Do not do them again unless your doctor says it is okay. This information is not intended to replace advice given to you by your health care provider. Make sure you discuss any questions you have with your health care provider. Document Revised: 11/20/2020 Document Reviewed: 11/20/2020 Elsevier Patient Education  [place].

## 2022-11-26 NOTE — Progress Notes (Signed)
Subjective:    Patient ID: Maria Powers, female    DOB: 1975-12-02, 47 y.o.   MRN: [id number]  HPI  RIght sacroiliac fusion May 2,2023 Dr Lynann Bologna  Deep pain Right buttocks improved , was on QID tramadol now on TID mostly   Pre op MRI showed no nerve root pinching according to pt   Pain still overall at a moderate range despite narcotic analgesic medications  Mod I all ADLs and working full time Pain Inventory Average Pain 5 Pain Right Now 1 My pain is sharp, stabbing, and aching  In the last 24 hours, has pain interfered with the following? General activity 3 Relation with others 2 Enjoyment of life 3 What TIME of day is your pain at its worst? night Sleep (in general) Fair  Pain is worse with: unsure Pain improves with: rest and medication Relief from Meds: 6  Family History  Problem Relation Age of Onset   Addison's disease Mother    Diabetes Mother    Bipolar disorder Mother    Fibroids Maternal Aunt    Social History   Socioeconomic History   Marital status: Single    Spouse name: Not on file   Number of children: Not on file   Years of education: Not on file   Highest education level: Not on file  Occupational History   Not on file  Tobacco Use   Smoking status: Former   Smokeless tobacco: Never  Vaping Use   Vaping Use: Never used  Substance and Sexual Activity   Alcohol use: No    Alcohol/week: 0.0 standard drinks of alcohol   Drug use: No   Sexual activity: Not Currently    Partners: Male    Birth control/protection: Abstinence, I.U.D.    Comment: Mirena IUD inserted 06-21-15  Other Topics Concern   Not on file  Social History Narrative   Work or School: Armed forces technical officer      Home Situation: roommate      Spiritual Beliefs:mormon      Lifestyle: no regular exercise; horrible diet            Social Determinants of Health   Financial Resource Strain: Not on file  Food Insecurity: Not on file  Transportation Needs:  Not on file  Physical Activity: Not on file  Stress: Not on file  Social Connections: Not on file   Past Surgical History:  Procedure Laterality Date   KIDNEY STONE SURGERY  09/21/2005   KNEE ARTHROSCOPY Right 09/21/2001   KNEE ARTHROSCOPY Right 09/21/2005   SACROILIAC JOINT FUSION  01/20/2022   SHOULDER ARTHROSCOPY Right 09/21/2005   WISDOM TOOTH EXTRACTION  Highschool   Past Surgical History:  Procedure Laterality Date   KIDNEY STONE SURGERY  09/21/2005   KNEE ARTHROSCOPY Right 09/21/2001   KNEE ARTHROSCOPY Right 09/21/2005   SACROILIAC JOINT FUSION  01/20/2022   SHOULDER ARTHROSCOPY Right 09/21/2005   WISDOM TOOTH EXTRACTION  Highschool   Past Medical History:  Diagnosis Date   Anxiety    Bipolar 1 disorder ([place])    Chronic pain    back, hip - seeing PMR   Depression    Dysmenorrhea    Hypertension    Obesity    BP (!) 157/103 Comment: 155/95  Pulse 79   Ht '5\' 5"'$  (1.651 m)   Wt 241 lb (109.3 kg)   SpO2 95%   BMI 40.10 kg/m   Opioid Risk Score:   Fall Risk Score:  `1  Depression screen  PHQ 2/9     11/03/2022    9:12 AM 03/04/2022    3:30 PM 09/29/2021    8:23 AM 08/29/2021   11:16 AM 07/10/2021    2:04 PM 01/02/2021   11:36 AM 12/05/2020   10:40 AM  Depression screen PHQ 2/9  Decreased Interest 1 0 3 0 0 0 1  Down, Depressed, Hopeless 1 0  0 0 0 1  PHQ - 2 Score 2 0 3 0 0 0 2  Altered sleeping '3 3 1      '$ Tired, decreased energy '2 1 2      '$ Change in appetite 0 0 0      Feeling bad or failure about yourself  1 0 3      Trouble concentrating 1 0 1      Moving slowly or fidgety/restless 0 0 0      Suicidal thoughts 1 0 3      PHQ-9 Score '10 4 13      '$ Difficult doing work/chores Somewhat difficult Somewhat difficult          Review of Systems  Musculoskeletal:  Positive for back pain.       Hip ,leg pain  All other systems reviewed and are negative.      Objective:   Physical Exam Vitals and nursing note reviewed.  Constitutional:       Appearance: She is obese.  HENT:     Head: Normocephalic and atraumatic.  Eyes:     Extraocular Movements: Extraocular movements intact.     Conjunctiva/sclera: Conjunctivae normal.     Pupils: Pupils are equal, round, and reactive to light.  Musculoskeletal:     Right lower leg: No edema.     Left lower leg: No edema.     Comments:  Sacral thrust (prone) :+ R Lateral compression:neg FABER's: + R Distraction (supine): neg Thigh thrust test: neg  Skin:    General: Skin is warm and dry.  Neurological:     Mental Status: She is alert and oriented to person, place, and time.  Psychiatric:        Mood and Affect: Mood normal.        Behavior: Behavior normal.   Neg SLR 5/5 strength in BLEs       Assessment & Plan:    RIght sacroiliac chronic pain, some improvement after fusion, no indication for interventional blocks of SI joint.  Will reduce tramadol to '50mg'$  TID, RTC with NP in 6 mo

## 2022-12-07 ENCOUNTER — Encounter: Payer: Self-pay | Admitting: Family Medicine

## 2022-12-13 ENCOUNTER — Other Ambulatory Visit: Payer: Self-pay | Admitting: Physical Medicine & Rehabilitation

## 2022-12-29 ENCOUNTER — Telehealth: Payer: Self-pay | Admitting: *Deleted

## 2022-12-29 DIAGNOSIS — Z79891 Long term (current) use of opiate analgesic: Secondary | ICD-10-CM

## 2022-12-29 DIAGNOSIS — Z5181 Encounter for therapeutic drug level monitoring: Secondary | ICD-10-CM

## 2022-12-29 DIAGNOSIS — G894 Chronic pain syndrome: Secondary | ICD-10-CM

## 2022-12-29 NOTE — Telephone Encounter (Signed)
A call was placed to Maria Powers to come by the office for a drug screen per Dr Wynn Banker. She will come tomorrow morning.

## 2022-12-29 NOTE — Telephone Encounter (Signed)
-----   Message from Erick Colace, MD sent at 12/22/2022  1:23 PM EDT ----- Pt did not do UDS, needs to come in for Swab or UDS and pill count within 24hrs of our call to her ----- Message ----- From: SYSTEM Sent: 12/01/2022  12:18 AM EDT To: Erick Colace, MD

## 2023-01-04 LAB — TOXASSURE SELECT,+ANTIDEPR,UR

## 2023-01-05 NOTE — Telephone Encounter (Signed)
Urine drug screen for this encounter is consistent for prescribed medication 

## 2023-01-19 ENCOUNTER — Other Ambulatory Visit: Payer: Self-pay | Admitting: Physical Medicine & Rehabilitation

## 2023-02-02 ENCOUNTER — Encounter: Payer: Self-pay | Admitting: Physical Medicine & Rehabilitation

## 2023-02-02 ENCOUNTER — Other Ambulatory Visit: Payer: Self-pay | Admitting: *Deleted

## 2023-02-02 MED ORDER — DICLOFENAC EPOLAMINE 1.3 % EX PTCH: 1.0000 | MEDICATED_PATCH | Freq: Two times a day (BID) | CUTANEOUS | 1 refills | Status: DC

## 2023-03-11 ENCOUNTER — Other Ambulatory Visit: Payer: Self-pay | Admitting: Physical Medicine & Rehabilitation

## 2023-03-19 ENCOUNTER — Other Ambulatory Visit: Payer: Self-pay | Admitting: Family Medicine

## 2023-03-19 DIAGNOSIS — R0981 Nasal congestion: Secondary | ICD-10-CM

## 2023-03-21 ENCOUNTER — Other Ambulatory Visit: Payer: Self-pay | Admitting: Physical Medicine & Rehabilitation

## 2023-03-22 MED ORDER — TIZANIDINE HCL 2 MG PO TABS: 4.0000 mg | ORAL_TABLET | Freq: Three times a day (TID) | ORAL | 1 refills | Status: DC | PRN

## 2023-03-26 ENCOUNTER — Encounter (HOSPITAL_COMMUNITY): Payer: Self-pay

## 2023-03-26 ENCOUNTER — Ambulatory Visit (HOSPITAL_COMMUNITY)
Admission: RE | Admit: 2023-03-26 | Discharge: 2023-03-26 | Disposition: A | Discharge status: Home / Self Care | Payer: Managed Care, Other (non HMO) | Source: Ambulatory Visit | Attending: Family Medicine | Admitting: Family Medicine

## 2023-03-26 VITALS — BP 154/91 | HR 81 | Temp 98.2°F | Resp 16

## 2023-03-26 DIAGNOSIS — G8929 Other chronic pain: Secondary | ICD-10-CM | POA: Diagnosis not present

## 2023-03-26 DIAGNOSIS — M5441 Lumbago with sciatica, right side: Secondary | ICD-10-CM | POA: Diagnosis not present

## 2023-03-26 MED ORDER — KETOROLAC TROMETHAMINE 30 MG/ML IJ SOLN
30.0000 mg | Freq: Once | INTRAMUSCULAR | Status: AC
  Administered 2023-03-26: 30 mg via INTRAMUSCULAR

## 2023-03-26 MED ORDER — KETOROLAC TROMETHAMINE 10 MG PO TABS
10.0000 mg | ORAL_TABLET | Freq: Four times a day (QID) | ORAL | 0 refills | Status: DC | PRN
Start: 2023-03-26 — End: 2023-07-13

## 2023-03-26 MED ORDER — KETOROLAC TROMETHAMINE 30 MG/ML IJ SOLN
INTRAMUSCULAR | Status: AC
  Filled 2023-03-26: qty 1

## 2023-03-26 NOTE — ED Provider Notes (Signed)
[facility]    CSN: [id number] Arrival date & time: 03/26/23  1849      History   Chief Complaint Chief Complaint  Patient presents with   Back Pain    I need a Toradol shot as a "reset" so my medications can help me. - Entered by patient    HPI Maria Powers is a 47 y.o. female.    Back Pain  Here for increase in her low back pain.  She is on chronic tramadol, tizanidine, and gabapentin.  It is worsened in the last last week or so.  She is no longer taking meloxicam.  She requests a Toradol shot to aid her in feeling better.  She does have chronic pain management follow-up.    No recent fever and no new bowel or bladder incontinence.  The pain is radiating down her right leg Past Medical History:  Diagnosis Date   Anxiety    Bipolar 1 disorder (HCC)    Chronic pain    back, hip - seeing PMR   Depression    Dysmenorrhea    Hypertension    Obesity     Patient Active Problem List   Diagnosis Date Noted   Routine general medical examination at a health care facility 11/03/2022   Bipolar 1 disorder (HCC) 02/20/2022   Insomnia 02/20/2022   Prediabetes 09/10/2020   Class 2 obesity with body mass index (BMI) of 39.0 to 39.9 in adult 10/16/2015   Patellofemoral arthritis of right knee 09/02/2015   Sacroiliac dysfunction 08/14/2014   Muscle pain, myofascial 08/14/2014   Essential hypertension, benign 04/30/2014   Menorrhagia 04/18/2014   Dysmenorrhea 04/18/2014   Migraine headache 07/10/2013   Low back pain radiating to right leg 05/18/2012    Past Surgical History:  Procedure Laterality Date   KIDNEY STONE SURGERY  09/21/2005   KNEE ARTHROSCOPY Right 09/21/2001   KNEE ARTHROSCOPY Right 09/21/2005   SACROILIAC JOINT FUSION  01/20/2022   SHOULDER ARTHROSCOPY Right 09/21/2005   WISDOM TOOTH EXTRACTION  Highschool    OB History     Gravida  1   Para  1   Term  1   Preterm      AB      Living  1      SAB      IAB      Ectopic       Multiple      Live Births  1            Home Medications    Prior to Admission medications   Medication Sig Start Date End Date Taking? Authorizing Provider  ketorolac (TORADOL) 10 MG tablet Take 1 tablet (10 mg total) by mouth every 6 (six) hours as needed (pain). 03/26/23  Yes Zenia Resides, MD  diclofenac (FLECTOR) 1.3 % PT[facility] Place 1 patch onto the skin every 12 (twelve) hours. DX:M54.5 02/02/23   Kirsteins, Victorino Sparrow, MD  fluticasone (FLONASE) 50 MCG/ACT nasal spray Place 2 sprays into both nostrils daily as needed for allergies or rhinitis. 11/23/22   [place], Betty G, MD  gabapentin (NEURONTIN) 600 MG tablet TAKE 1/2  TABLET BY MOUTH THREE TIMES A DAY 03/11/23   Kirsteins, Victorino Sparrow, MD  hydrocortisone (ANUSOL-HC) 2.5 % rectal cream Place 1 Application rectally 2 (two) times daily. 10/08/22   Philip Aspen, Limmie Patricia, MD  levonorgestrel (MIRENA) 20 MCG/24HR IUD 1 each by Intrauterine route once.    [provider]  Multiple Vitamin (MULTIVITAMIN WITH  MINERALS) TABS tablet Take 1 tablet by mouth daily. Patient not taking: Reported on 11/26/2022    [provider]  rizatriptan (MAXALT-MLT) 5 MG disintegrating tablet Take 1 tablet (5 mg total) by mouth as needed. 07/05/18   Terressa Koyanagi, DO  tiZANidine (ZANAFLEX) 2 MG tablet Take 2 tablets (4 mg total) by mouth every 8 (eight) hours as needed for muscle spasms. 03/22/23   Kirsteins, Victorino Sparrow, MD  traMADol (ULTRAM) 50 MG tablet TAKE 1 TABLET BY MOUTH 4 TIMES A DAY 01/19/23   Kirsteins, Victorino Sparrow, MD    Family History Family History  Problem Relation Age of Onset   Addison's disease Mother    Diabetes Mother    Bipolar disorder Mother    Fibroids Maternal Aunt     Social History Social History   Tobacco Use   Smoking status: Former   Smokeless tobacco: Never  Building services engineer Use: Never used  Substance Use Topics   Alcohol use: No    Alcohol/week: 0.0 standard drinks of alcohol   Drug use: No      Allergies   Clindamycin/lincomycin and Nubain [nalbuphine hcl]   Review of Systems Review of Systems  Musculoskeletal:  Positive for back pain.     Physical Exam Triage Vital Signs ED Triage Vitals  Enc Vitals Group     BP 03/26/23 1904 (!) 154/91     Pulse Rate 03/26/23 1904 81     Resp 03/26/23 1904 16     Temp 03/26/23 1904 98.2 F (36.8 C)     Temp Source 03/26/23 1904 Oral     SpO2 03/26/23 1904 98 %     Weight --      Height --      Head Circumference --      Peak Flow --      Pain Score 03/26/23 1906 7     Pain Loc --      Pain Edu? --      Excl. in GC? --    No data found.  Updated Vital Signs BP (!) 154/91 (BP Location: Left Arm)   Pulse 81   Temp 98.2 F (36.8 C) (Oral)   Resp 16   LMP  (LMP Unknown)   SpO2 98%   Visual Acuity Right Eye Distance:   Left Eye Distance:   Bilateral Distance:    Right Eye Near:   Left Eye Near:    Bilateral Near:     Physical Exam Vitals reviewed.  Constitutional:      General: She is not in acute distress.    Appearance: She is not ill-appearing, toxic-appearing or diaphoretic.  Cardiovascular:     Rate and Rhythm: Normal rate and regular rhythm.  Pulmonary:     Breath sounds: Normal breath sounds.  Musculoskeletal:     Comments: She is tender in her lumbosacral area.  Straight leg raise causes pain into her right lower leg  Skin:    Coloration: Skin is not pale.  Neurological:     General: No focal deficit present.     Mental Status: She is alert and oriented to person, place, and time.  Psychiatric:        Behavior: Behavior normal.      UC Treatments / Results  Labs (all labs ordered are listed, but only abnormal results are displayed) Labs Reviewed - No data to display  EKG   Radiology No results found.  Procedures Procedures (including critical care time)  Medications Ordered in UC Medications  ketorolac (TORADOL) 30 MG/ML injection 30 mg (has no administration in time range)     Initial Impression / Assessment and Plan / UC Course  I have reviewed the triage vital signs and the nursing notes.  Pertinent labs & imaging results that were available during my care of the patient were reviewed by me and considered in my medical decision making (see chart for details).        Toradol injection is given here and I have sent her in a small quantity of ketorolac 10 mg tablets also. Final Clinical ImPressions(s) / UC Diagnoses   Final diagnoses:  Chronic right-sided low back pain with right-sided sciatica     Discharge Instructions      You have been given a shot of Toradol 30 mg today.  Ketorolac 10 mg tablets--take 1 tablet every 6 hours as needed for pain.  This is the same medicine that is in the shot we just gave you  You can continue your other medications the same.     ED Prescriptions     Medication Sig Dispense Auth. Provider   ketorolac (TORADOL) 10 MG tablet Take 1 tablet (10 mg total) by mouth every 6 (six) hours as needed (pain). 20 tablet Ashley Bultema, Janace Aris, MD      I have reviewed the PDMP during this encounter.   Zenia Resides, MD 03/26/23 Ernestina [place]

## 2023-03-26 NOTE — ED Triage Notes (Signed)
Pt states lower back pain radiating down her right leg for the past few weeks. Has been taking a muscle relaxer at home with some relief. States she would like a shot of Toradol.

## 2023-03-26 NOTE — Discharge Instructions (Signed)
You have been given a shot of Toradol 30 mg today.  Ketorolac 10 mg tablets--take 1 tablet every 6 hours as needed for pain.  This is the same medicine that is in the shot we just gave you  You can continue your other medications the same.

## 2023-04-16 ENCOUNTER — Encounter: Payer: Self-pay | Admitting: Family Medicine

## 2023-04-16 ENCOUNTER — Telehealth (INDEPENDENT_AMBULATORY_CARE_PROVIDER_SITE_OTHER): Payer: Managed Care, Other (non HMO) | Admitting: Family Medicine

## 2023-04-16 VITALS — Ht 65.0 in

## 2023-04-16 DIAGNOSIS — F319 Bipolar disorder, unspecified: Secondary | ICD-10-CM | POA: Diagnosis not present

## 2023-04-16 DIAGNOSIS — G4700 Insomnia, unspecified: Secondary | ICD-10-CM | POA: Diagnosis not present

## 2023-04-16 MED ORDER — ZOLPIDEM TARTRATE ER 12.5 MG PO TBCR: 12.5000 mg | EXTENDED_RELEASE_TABLET | Freq: Every evening | ORAL | 0 refills | Status: DC | PRN

## 2023-04-16 NOTE — Assessment & Plan Note (Signed)
This is a chronic problem. We discussed possible etiologies. Stressed the importance of adequate sleep hygiene. She has tried numerous medication, includingtrazodone, Ambien, Lunesta, doxepin, Abilify, Seroquel, Xanax, melatonin, and daridorexant, they all have been ineffective. She agrees with trying Ambien CR 20 mg daily at bedtime. We discussed side effects of medications. Instructed to let me know in about 2 to 3 weeks if medication is helping, if not we will recommend psychiatric evaluation and/or sleep study.

## 2023-04-16 NOTE — Progress Notes (Unsigned)
Virtual Visit via Video Note I connected with Maria Powers on 04/16/23 by a video enabled telemedicine application and verified that I am speaking with the correct person using two identifiers. Location patient: home Location provider:work office Persons participating in the virtual visit: patient, provider  I discussed the limitations of evaluation and management by telemedicine and the availability of in person appointments. The patient expressed understanding and agreed to proceed.  Chief Complaint  Patient presents with   Insomnia    Waking up constantly throughout the night, unable to sleep a full night. Has tried Doxepin, Trazodone, Ambien, Melatonin, Abilify & Xanax in the past.    HPI: Maria Powers is a 47 year old female with past medical history of anxiety, bipolar 1 disorder, depression, and chronic pain being seen today with ongoing sleep issues, reporting difficulty both falling asleep and staying asleep throughout the night.  She has a history of trying various medications for sleep management, including doxepin, trazodone, Ambien, melatonin, Abilify, Xanax, daridorexant 25 mg, and Seroquel, with limited success.  She mentions that Seroquel caused weight gain, while trazodone resulted in feeling unwell the following day. She is open to trying Ambien again.  Her typical sleep pattern involves going to bed around 11 PM but struggling to fall asleep until 2 or 3 AM. Additionally, she reports waking up two to three times throughout the night.   No known hx of sleep apnea.  She expresses concern about the potential impact of her sleep issues on an upcoming national certification exam.  Bipolar 1 disorder, she has not seen a psychiatrist in several years.  Negative for fever,chills,abnormal wt loss, CP,SOB,edema,cold/heat intolerance, nocturia, or tremor. Negative for hallucinations.  ROS: See pertinent positives and negatives per HPI.  Past Medical History:  Diagnosis Date    Anxiety    Bipolar 1 disorder (HCC)    Chronic pain    back, hip - seeing PMR   Depression    Dysmenorrhea    Hypertension    Obesity     Past Surgical History:  Procedure Laterality Date   KIDNEY STONE SURGERY  09/21/2005   KNEE ARTHROSCOPY Right 09/21/2001   KNEE ARTHROSCOPY Right 09/21/2005   SACROILIAC JOINT FUSION  01/20/2022   SHOULDER ARTHROSCOPY Right 09/21/2005   WISDOM TOOTH EXTRACTION  Highschool    Family History  Problem Relation Age of Onset   Addison's disease Mother    Diabetes Mother    Bipolar disorder Mother    Fibroids Maternal Aunt     Social History   Socioeconomic History   Marital status: Single    Spouse name: Not on file   Number of children: Not on file   Years of education: Not on file   Highest education level: Not on file  Occupational History   Not on file  Tobacco Use   Smoking status: Former   Smokeless tobacco: Never  Vaping Use   Vaping status: Never Used  Substance and Sexual Activity   Alcohol use: No    Alcohol/week: 0.0 standard drinks of alcohol   Drug use: No   Sexual activity: Not Currently    Partners: Male    Birth control/protection: Abstinence, I.U.D.    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      Spiritual Beliefs:mormon      Lifestyle: no regular exercise; horrible diet  Social Determinants of Health   Financial Resource Strain: Not on file  Food Insecurity: Not on file  Transportation Needs: Not on file  Physical Activity: Not on file  Stress: Not on file  Social Connections: Not on file  Intimate Partner Violence: Not on file   Current Outpatient Medications:    fluticasone (FLONASE) 50 MCG/ACT nasal spray, Place 2 sprays into both nostrils daily as needed for allergies or rhinitis., Disp: 16 g, Rfl: 3   gabapentin (NEURONTIN) 600 MG tablet,  TAKE 1/2  TABLET BY MOUTH THREE TIMES A DAY, Disp: 45 tablet, Rfl: 2   hydrocortisone (ANUSOL-HC) 2.5 % rectal cream, Place 1 Application rectally 2 (two) times daily., Disp: 30 g, Rfl: 2   ketorolac (TORADOL) 10 MG tablet, Take 1 tablet (10 mg total) by mouth every 6 (six) hours as needed (pain)., Disp: 20 tablet, Rfl: 0   levonorgestrel (MIRENA) 20 MCG/24HR IUD, 1 each by Intrauterine route once., Disp: , Rfl:    Multiple Vitamin (MULTIVITAMIN WITH MINERALS) TABS tablet, Take 1 tablet by mouth daily., Disp: , Rfl:    rizatriptan (MAXALT-MLT) 5 MG disintegrating tablet, Take 1 tablet (5 mg total) by mouth as needed., Disp: 10 tablet, Rfl: 0   tiZANidine (ZANAFLEX) 2 MG tablet, Take 2 tablets (4 mg total) by mouth every 8 (eight) hours as needed for muscle spasms., Disp: 90 tablet, Rfl: 1   traMADol (ULTRAM) 50 MG tablet, TAKE 1 TABLET BY MOUTH 4 TIMES A DAY, Disp: 120 tablet, Rfl: 5  EXAM:  VITALS per patient if applicable:Ht 5\' 5"  (1.651 m)   LMP  (LMP Unknown)   BMI 40.10 kg/m   GENERAL: alert, oriented, appears well and in no acute distress  HEENT: atraumatic, conjunctiva clear, no obvious abnormalities on inspection.  NECK: normal movements of the head and neck  LUNGS: on inspection no signs of respiratory distress, breathing rate appears normal, no obvious gross SOB, gasping or wheezing  CV: no obvious cyanosis  MS: moves all visible extremities without noticeable abnormality  PSYCH/NEURO: pleasant and cooperative, no obvious depression or anxiety, speech and thought processing grossly intact  ASSESSMENT AND PLAN:  Discussed the following assessment and plan:  Insomnia, unspecified type Assessment & Plan: This is a chronic problem. We discussed possible etiologies. Stressed the importance of adequate sleep hygiene. She has tried numerous medication, includingtrazodone, Ambien, Lunesta, doxepin, Abilify, Seroquel, Xanax, melatonin, and daridorexant, they all have been  ineffective. She agrees with trying Ambien CR 20 mg daily at bedtime. We discussed side effects of medications. Instructed to let me know in about 2 to 3 weeks if medication is helping, if not we will recommend psychiatric evaluation and/or sleep study.  Orders: -     Zolpidem Tartrate ER; Take 1 tablet (12.5 mg total) by mouth at bedtime as needed for sleep.  Dispense: 30 tablet; Refill: 0  Bipolar 1 disorder (HCC) Assessment & Plan: She has not followed with psychiatrist in years, currently she is not on pharmacologic treatment. This problem could be contributing to her insomnia. If insomnia does not improve with treatment, would recommend reestablishing with psychiatrist.    We discussed possible serious and likely etiologies, options for evaluation and workup, limitations of telemedicine visit vs in person visit, treatment, treatment risks and precautions. The patient was advised to call back or seek an in-person evaluation if the symptoms worsen or if the condition fails to improve as anticipated. I discussed the assessment and treatment plan with the patient. The patient was  provided an opportunity to ask questions and all were answered. The patient agreed with the plan and demonstrated an understanding of the instructions.  Return in about 3 months (around 07/17/2023) for If treatmetn is effective.  Aadan Chenier G. [place], MD  [facility]. [place] office.

## 2023-04-16 NOTE — Assessment & Plan Note (Signed)
She has not followed with psychiatrist in years, currently she is not on pharmacologic treatment. This problem could be contributing to her insomnia. If insomnia does not improve with treatment, would recommend reestablishing with psychiatrist.

## 2023-04-17 ENCOUNTER — Other Ambulatory Visit: Payer: Self-pay | Admitting: Physical Medicine & Rehabilitation

## 2023-04-17 ENCOUNTER — Encounter: Payer: Self-pay | Admitting: Family Medicine

## 2023-05-03 ENCOUNTER — Telehealth: Payer: Self-pay | Admitting: *Deleted

## 2023-05-03 NOTE — Telephone Encounter (Signed)
Patient requesting 90 day supply  tizanidine 2 mg

## 2023-05-07 ENCOUNTER — Other Ambulatory Visit: Payer: Self-pay | Admitting: Physical Medicine & Rehabilitation

## 2023-05-07 ENCOUNTER — Encounter: Payer: Self-pay | Admitting: Physical Medicine & Rehabilitation

## 2023-05-10 ENCOUNTER — Other Ambulatory Visit: Payer: Self-pay | Admitting: Physical Medicine & Rehabilitation

## 2023-05-11 ENCOUNTER — Other Ambulatory Visit: Payer: Self-pay | Admitting: Family Medicine

## 2023-05-11 DIAGNOSIS — G4700 Insomnia, unspecified: Secondary | ICD-10-CM

## 2023-05-11 NOTE — Telephone Encounter (Signed)
Last OV 03/2023, advised to follow up in 3 months if treatment is effective (06/2023) Last filled 04/16/23

## 2023-05-12 ENCOUNTER — Telehealth: Payer: Self-pay | Admitting: Family Medicine

## 2023-05-12 NOTE — Telephone Encounter (Signed)
Signed. BJ

## 2023-05-12 NOTE — Telephone Encounter (Signed)
In your bin to be signed.  

## 2023-05-12 NOTE — Telephone Encounter (Signed)
Patient dropped off document  Ins Wellness Screening Form , to be filled out by provider. Patient requested to send it back via Call Patient to pick up within 7-days. Document is located in providers tray at front office.Please advise at [facility] [phone]

## 2023-05-12 NOTE — Telephone Encounter (Signed)
Form is up front, patient is aware.

## 2023-05-26 NOTE — Progress Notes (Signed)
GYNECOLOGY  VISIT   HPI: 47 y.o.   Single  Caucasian  female   [id number] with Patient's last menstrual period was 04/12/2023.   here for   IUD exchange.  Desires a new Mirena IUD.   Did have a period at the end of July.  Not sexually active since the last annual exam.   UPT negative today.   GYNECOLOGIC HISTORY: Patient's last menstrual period was 04/12/2023. Contraception:  IUD--Mirena 06/21/15 Menopausal hormone therapy:  n/a Last mammogram:  n/a Last pap smear:   02/22/20 neg: HR HPV neg, 07-12-14 Ascus:neg, 11-24-11 Neg         OB History     Gravida  1   Para  1   Term  1   Preterm      AB      Living  1      SAB      IAB      Ectopic      Multiple      Live Births  1              Patient Active Problem List   Diagnosis Date Noted   Routine general medical examination at a health care facility 11/03/2022   Bipolar 1 disorder (HCC) 02/20/2022   Insomnia 02/20/2022   Prediabetes 09/10/2020   Class 2 obesity with body mass index (BMI) of 39.0 to 39.9 in adult 10/16/2015   Patellofemoral arthritis of right knee 09/02/2015   Sacroiliac dysfunction 08/14/2014   Muscle pain, myofascial 08/14/2014   Essential hypertension, benign 04/30/2014   Menorrhagia 04/18/2014   Dysmenorrhea 04/18/2014   Migraine headache 07/10/2013   Low back pain radiating to right leg 05/18/2012    Past Medical History:  Diagnosis Date   Anxiety    Bipolar 1 disorder (HCC)    Chronic pain    back, hip - seeing PMR   Depression    Dysmenorrhea    Hypertension    Obesity     Past Surgical History:  Procedure Laterality Date   KIDNEY STONE SURGERY  09/21/2005   KNEE ARTHROSCOPY Right 09/21/2001   KNEE ARTHROSCOPY Right 09/21/2005   SACROILIAC JOINT FUSION  01/20/2022   SHOULDER ARTHROSCOPY Right 09/21/2005   WISDOM TOOTH EXTRACTION  Highschool    Current Outpatient Medications  Medication Sig Dispense Refill   fluticasone (FLONASE) 50 MCG/ACT nasal spray SPRAY 2  SPRAYS IN EACH NOSTRIL ONCE DAILY AS NEEDED 16 mL 0   gabapentin (NEURONTIN) 600 MG tablet TAKE 1/2  TABLET BY MOUTH THREE TIMES A DAY 45 tablet 2   hydrocortisone (ANUSOL-HC) 2.5 % rectal cream Place 1 Application rectally 2 (two) times daily. 30 g 2   ketorolac (TORADOL) 10 MG tablet Take 1 tablet (10 mg total) by mouth every 6 (six) hours as needed (pain). 20 tablet 0   levonorgestrel (MIRENA) 20 MCG/24HR IUD 1 each by Intrauterine route once.     Multiple Vitamin (MULTIVITAMIN WITH MINERALS) TABS tablet Take 1 tablet by mouth daily.     rizatriptan (MAXALT-MLT) 5 MG disintegrating tablet Take 1 tablet (5 mg total) by mouth as needed. 10 tablet 0   tiZANidine (ZANAFLEX) 2 MG tablet TAKE 2 TABLETS BY MOUTH EVERY 8 HOURS AS NEEDED FOR MUSCLE SPASMS. 90 tablet 0   traMADol (ULTRAM) 50 MG tablet TAKE 1 TABLET BY MOUTH 4 TIMES A DAY 120 tablet 5   zolpidem (AMBIEN CR) 12.5 MG CR tablet TAKE 1 TABLET BY MOUTH EVERY NIGHT AT BEDTIME AS  NEEDED FOR SLEEP 30 tablet 1   No current facility-administered medications for this visit.     ALLERGIES: Clindamycin/lincomycin and Nubain [nalbuphine hcl]  Family History  Problem Relation Age of Onset   Addison's disease Mother    Diabetes Mother    Bipolar disorder Mother    Fibroids Maternal Aunt     Social History   Socioeconomic History   Marital status: Single    Spouse name: Not on file   Number of children: Not on file   Years of education: Not on file   Highest education level: Not on file  Occupational History   Not on file  Tobacco Use   Smoking status: Former   Smokeless tobacco: Never  Vaping Use   Vaping status: Never Used  Substance and Sexual Activity   Alcohol use: No    Alcohol/week: 0.0 standard drinks of alcohol   Drug use: No   Sexual activity: Not Currently    Partners: Male    Birth control/protection: Abstinence, I.U.D.    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   PHYSICAL EXAMINATION:    Wt 240 lb (108.9 kg)   LMP 04/12/2023   BMI 39.94 kg/m     General appearance: alert, cooperative and appears stated age   Pelvic: External genitalia:  no lesions              Urethra:  normal appearing urethra with no masses, tenderness or lesions              Bartholins and Skenes: normal                 Vagina: normal appearing vagina with normal color and discharge, no lesions              Cervix: no lesions.  IUD strings noted.                Bimanual Exam:  Uterus:  normal size, contour, position, consistency, mobility, non-tender              Adnexa: no mass, fullness, tenderness  Consent for IUD removal and reinsertion. Mirena IUD insertion.  Lot  [id number], expiration  Aug 2026. Speculum placed in vagina.  Sterile prep of cervix with Hibiclens.  Paracervical block with 10 cc 1% lidocaine - lot  [id number]      , expiration  Jul 2026. Ring forceps used to grasp IUD strings and remove IUD intact, declined to be seen by patient, and discarded. Tenaculum to anterior cervical lip.  Uterus sounded to    7    cm.  Mirena IUD placed without difficulty.  Strings trimmed. Repeat bimanual exam, no change. No complications.  Minimal EBL.  Assessment: Mirena IUD exchange.  Plan:  Instructions and precautions given.  Back up  contraception discussed. Card given to patient with insertion date, recommended removal date, and lot number.   Follow up for a recheck in 4 weeks, sooner as needed. Annual exam in Feb. 2025.   After visit summary to patient.

## 2023-06-08 ENCOUNTER — Other Ambulatory Visit: Payer: Self-pay | Admitting: Physical Medicine & Rehabilitation

## 2023-06-08 ENCOUNTER — Other Ambulatory Visit: Payer: Self-pay | Admitting: Family Medicine

## 2023-06-08 DIAGNOSIS — J302 Other seasonal allergic rhinitis: Secondary | ICD-10-CM

## 2023-06-09 ENCOUNTER — Ambulatory Visit (INDEPENDENT_AMBULATORY_CARE_PROVIDER_SITE_OTHER): Payer: Managed Care, Other (non HMO) | Admitting: Obstetrics and Gynecology

## 2023-06-09 ENCOUNTER — Encounter: Payer: Self-pay | Admitting: Obstetrics and Gynecology

## 2023-06-09 VITALS — BP 136/80 | HR 68 | Wt 240.0 lb

## 2023-06-09 DIAGNOSIS — Z01812 Encounter for preprocedural laboratory examination: Secondary | ICD-10-CM

## 2023-06-09 DIAGNOSIS — Z30433 Encounter for removal and reinsertion of intrauterine contraceptive device: Secondary | ICD-10-CM

## 2023-06-09 DIAGNOSIS — Z308 Encounter for other contraceptive management: Secondary | ICD-10-CM

## 2023-06-09 LAB — PREGNANCY, URINE: Preg Test, Ur: NEGATIVE

## 2023-06-09 NOTE — Patient Instructions (Signed)
Intrauterine Device Insertion An intrauterine device (IUD) is a medical device that is inserted into the uterus to prevent pregnancy. It is a small, T-shaped device that has one or two nylon strings hanging down from it. The strings hang out of the lower part of the uterus (cervix) to allow for future IUD removal. There are two types of IUDs: Hormone IUD. This type of IUD is made of plastic and contains the hormone progestin (synthetic progesterone). A hormone IUD may last 3-5 years, depending on which one you have. Synthetic progesterone prevents pregnancy by: Thickening cervical mucus to prevent sperm from entering the uterus. Thinning the uterine lining to prevent a fertilized egg from implanting there. Copper IUD. This type of IUD has copper wire wrapped around it. A copper IUD may last up to 10 years. Copper prevents pregnancy by making the uterus and fallopian tubes produce a fluid that kills sperm. Tell a health care provider about: Any allergies you have. All medicines you are taking, including vitamins, herbs, eye drops, creams, and over-the-counter medicines. Any surgeries you have had. Any medical conditions you have, including any sexually transmitted infections (STIs) you may have. Whether you are pregnant or may be pregnant. What are the risks? Generally, this is a safe procedure. However, problems may occur, including: Infection. Bleeding. Allergic reactions to medicines. Puncture (perforation) of the uterus or damage to other structures or organs. Accidental placement of the IUD either in the muscle layer of the uterus (myometrium) or outside the uterus. The IUD falling out of the uterus (expulsion). This is more common among women who have recently had a child. Higher risk of an egg being fertilized outside your uterus (ectopic pregnancy).This is rare. Pelvic inflammatory disease (PID), which is an infection in the uterus and fallopian tubes. The IUD does not cause the  infection. The infection is usually from an unknown sexually transmitted infection (STI). This is rare, and it usually happens during the first 20 days after the IUD is inserted. What happens before the procedure? Ask your health care provider about: Changing or stopping your regular medicines. This is especially important if you are taking diabetes medicines or blood thinners. Taking over-the-counter medicines, vitamins, herbs, and supplements. Talk with your health care provider about when to schedule your IUD placement. Your health care provider may recommend taking over-the-counter pain medicines before the procedure. These medicines include ibuprofen and naproxen. You may have tests for: Pregnancy. A pregnancy test involves having a urine or blood sample taken. Sexually transmitted infections (STIs). Placing an IUD in someone who has an STI can make the infection worse. Cervical cancer. You may have a Pap test to check for this type of cancer. This means collecting cells from your cervix to be checked under a microscope. You may have a physical exam to determine the size and position of your uterus. What happens during the procedure? A tool (speculum) will be placed in your vagina and widened so that your health care provider can see your cervix. Medicine, or antiseptic, may be applied to your cervix to help lower your risk of infection. You may be given an anesthetic medicine to numb each side of your cervix. This medicine is usually given by an injection into the cervix. A tool called a uterine sound will be inserted into your uterus to check the length of your uterus and the direction that your uterus may be tilted. A slim instrument or tube (IUD inserter) that holds the IUD will be inserted into your vagina,  through your cervical canal, and into your uterus. The IUD will be placed in the uterus, and the IUD inserter will be removed. The strings that are attached to the IUD will be trimmed  so that they lie just below the cervix. The speculum will be removed. The procedure may vary among health care providers and hospitals. What can I expect after procedure? You may have bleeding after the procedure. This is normal. It varies from light bleeding (spotting) for a few days to menstrual-like bleeding. You may have cramping and pain in the abdomen. You may feel dizzy or light-headed. You may have lower back pain. You may have headaches and nausea. Follow these instructions at home: Before resuming sexual activity, check to make sure that you can feel the IUD string or strings. You should be able to feel the end of the string below the opening of your cervix. If your IUD string is in place, you may resume sexual activity. If you had a hormonal IUD inserted more than 7 days after your most recent period started, you will need to use a backup method of birth control for 7 days after IUD insertion. Ask your health care provider whether this applies to you. Continue to check that the IUD is still in place by feeling for the strings after every menstrual period, or once a month. An IUD will not protect you from sexually transmitted infections (STIs). Use methods to prevent the exchange of body fluids between partners (barrier protection) every time you have sex. Barrier protection can be used during oral, vaginal, or anal sex. Commonly used barrier methods include: Female condom. Female condom. Dental dam. Take over-the-counter and prescription medicines only as told by your health care provider. Keep all follow-up visits. This is important. Contact a health care provider if: You feel light-headed or weak. You have any of the following problems with your IUD string or strings: The string bothers or hurts you or your sexual partner. You cannot feel the string. The string has gotten longer. You can feel the IUD in your vagina. You think you may be pregnant, or you miss your menstrual  period. You think you may have a sexually transmitted infection (STI). Get help right away if you: You have flu-like symptoms, such as tiredness (fatigue) and muscle aches. You have a fever and chills. You have bleeding that is heavier or lasts longer than a normal menstrual cycle. You have abnormal or bad-smelling discharge from your vagina. You develop abdominal pain that is new, is getting worse, or is not in the same area of earlier cramping and pain. You have pain during sexual activity. Summary An intrauterine device (IUD) is a small, T-shaped device that has one or two nylon strings hanging down from it. You may have a copper IUD or a hormone IUD. Ask your health care provider what you need to do before the procedure. You may have some tests and you may have to change or stop some medicines. You may have bleeding after the procedure. This is normal. It varies from light spotting for a few days to menstrual-like bleeding. Check to make sure that you can feel the IUD strings before you resume sexual activity. Check the strings after every menstrual period or once a month. An IUD does not protect against STIs. Use other methods to protect yourself against infections. This information is not intended to replace advice given to you by your health care provider. Make sure you discuss any questions you have with  your health care provider. Document Revised: 03/20/2020 Document Reviewed: 03/20/2020 Elsevier Patient Education  2024 [employer].

## 2023-06-24 ENCOUNTER — Other Ambulatory Visit: Payer: Self-pay | Admitting: Physical Medicine & Rehabilitation

## 2023-06-29 NOTE — Progress Notes (Signed)
GYNECOLOGY  VISIT   HPI: 47 y.o.   Single  Caucasian  female   G1P1001 with No LMP recorded. (Menstrual status: IUD).   here for   4 week IUD check.  Mirena IUD placed 06/09/23.  Bleeding from IUD placement stopped a few days ago.  No pain.    Not sexually active.   States she is maybe passing a kidney stone.  Pain started on her left side one week ago and shooting to her bladder area.  AZO is helpful.  No blood in her urine.  Has sharp stabbing pain with urination.  Hx UTIs.   No fever or shaking chills.  No nausea or vomiting.   Hx renal stone treated with surgical removal in 2007.   She saw Dr. Wynn Banker today for sacroiliac dysfunction.   GYNECOLOGIC HISTORY: No LMP recorded. (Menstrual status: IUD). Contraception:  IUD--Mirena 06/09/23 Menopausal hormone therapy:  n/a Last mammogram:  n/a Last pap smear:    02/22/20 neg: HR HPV neg, 07-12-14 Ascus:neg, 11-24-11 Neg         OB History     Gravida  1   Para  1   Term  1   Preterm      AB      Living  1      SAB      IAB      Ectopic      Multiple      Live Births  1              Patient Active Problem List   Diagnosis Date Noted   Routine general medical examination at a health care facility 11/03/2022   Bipolar 1 disorder (HCC) 02/20/2022   Insomnia 02/20/2022   Prediabetes 09/10/2020   Class 2 obesity with body mass index (BMI) of 39.0 to 39.9 in adult 10/16/2015   Patellofemoral arthritis of right knee 09/02/2015   Sacroiliac dysfunction 08/14/2014   Muscle pain, myofascial 08/14/2014   Essential hypertension, benign 04/30/2014   Menorrhagia 04/18/2014   Dysmenorrhea 04/18/2014   Migraine headache 07/10/2013   Low back pain radiating to right leg 05/18/2012    Past Medical History:  Diagnosis Date   Anxiety    Bipolar 1 disorder (HCC)    Chronic pain    back, hip - seeing PMR   Depression    Dysmenorrhea    Hypertension    Obesity     Past Surgical History:  Procedure  Laterality Date   KIDNEY STONE SURGERY  09/21/2005   KNEE ARTHROSCOPY Right 09/21/2001   KNEE ARTHROSCOPY Right 09/21/2005   SACROILIAC JOINT FUSION  01/20/2022   SHOULDER ARTHROSCOPY Right 09/21/2005   WISDOM TOOTH EXTRACTION  Highschool    Current Outpatient Medications  Medication Sig Dispense Refill   fluticasone (FLONASE) 50 MCG/ACT nasal spray SPRAY 2 SPRAYS IN EACH NOSTRIL ONCE DAILY AS NEEDED 16 mL 0   gabapentin (NEURONTIN) 600 MG tablet TAKE 1/2 TABLET BY MOUTH THREE TIMES A DAY 45 tablet 2   hydrocortisone (ANUSOL-HC) 2.5 % rectal cream Place 1 Application rectally 2 (two) times daily. 30 g 2   levonorgestrel (MIRENA) 20 MCG/24HR IUD 1 each by Intrauterine route once.     meloxicam (MOBIC) 7.5 MG tablet Take 1 tablet (7.5 mg total) by mouth daily. 30 tablet 0   Multiple Vitamin (MULTIVITAMIN WITH MINERALS) TABS tablet Take 1 tablet by mouth daily.     rizatriptan (MAXALT-MLT) 5 MG disintegrating tablet Take 1 tablet (5 mg total)  by mouth as needed. 10 tablet 0   tiZANidine (ZANAFLEX) 2 MG tablet TAKE TWO TABLETS BY MOUTH EVERY 8 HOURS AS NEEDED FOR MUSCLE SPASMS 90 tablet 0   traMADol (ULTRAM) 50 MG tablet Take 1 tablet (50 mg total) by mouth 4 (four) times daily. 120 tablet 5   zolpidem (AMBIEN CR) 12.5 MG CR tablet TAKE 1 TABLET BY MOUTH EVERY NIGHT AT BEDTIME AS NEEDED FOR SLEEP 30 tablet 1   No current facility-administered medications for this visit.     ALLERGIES: Clindamycin/lincomycin and Nubain [nalbuphine hcl]  Family History  Problem Relation Age of Onset   Addison's disease Mother    Diabetes Mother    Bipolar disorder Mother    Fibroids Maternal Aunt     Social History   Socioeconomic History   Marital status: Single    Spouse name: Not on file   Number of children: Not on file   Years of education: Not on file   Highest education level: Not on file  Occupational History   Not on file  Tobacco Use   Smoking status: Former   Smokeless tobacco:  Never  Vaping Use   Vaping status: Never Used  Substance and Sexual Activity   Alcohol use: No    Alcohol/week: 0.0 standard drinks of alcohol   Drug use: No   Sexual activity: Not Currently    Partners: Male    Birth control/protection: Abstinence, I.U.D.    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      Spiritual Beliefs:mormon      Lifestyle: no regular exercise; horrible diet            Social Determinants of Health   Financial Resource Strain: Not on file  Food Insecurity: Not on file  Transportation Needs: Not on file  Physical Activity: Not on file  Stress: Not on file  Social Connections: Not on file  Intimate Partner Violence: Not on file    Review of Systems  All other systems reviewed and are negative.   PHYSICAL EXAMINATION:    BP 126/80 (BP Location: Left Arm, Patient Position: Sitting, Cuff Size: Large)   Pulse 91   Ht 5\' 5"  (1.651 m)   Wt 235 lb (106.6 kg)   SpO2 96%   BMI 39.11 kg/m     General appearance: alert, cooperative and appears stated age  Pelvic: External genitalia:  no lesions              Urethra:  normal appearing urethra with no masses, tenderness or lesions              Bartholins and Skenes: normal                 Vagina: normal appearing vagina with normal color and discharge, no lesions              Cervix: no lesions.  IUD strings noted.  No CMS.                Bimanual Exam:  Uterus:  normal size, contour, position, consistency, mobility, non-tender              Adnexa: no mass, fullness, tenderness           Chaperone was present for exam:  Warren Lacy, CMA  ASSESSMENT  IUD  check up.  Bladder pain.  Hx renal stones.    PLAN  Reassurance regarding IUD.  Urinalysis 1.025, ph 5.5, 1+ protein, 0 - 5 WBC, NS RBC, 6 - 10 epis, few bacteria, moderate calcium oxylate stones, moderate  mucus.  UC sent.  Bactrim DS po bid x 3 days.  Hydrate well.  Ok to continue AZO for 2 more days.  Tylenol prn.  If pain does not improve or if worsens, I recommend an ER visit for further evaluation and tx.  Annual exam in March, 2025 per patient preference.     23 min  total time was spent for this patient encounter, including preparation, face-to-face counseling with the patient, coordination of care, and documentation of the encounter.

## 2023-07-08 ENCOUNTER — Other Ambulatory Visit: Payer: Self-pay | Admitting: Physical Medicine & Rehabilitation

## 2023-07-13 ENCOUNTER — Encounter
Payer: Managed Care, Other (non HMO) | Attending: Physical Medicine & Rehabilitation | Admitting: Physical Medicine & Rehabilitation

## 2023-07-13 ENCOUNTER — Encounter: Payer: Self-pay | Admitting: Obstetrics and Gynecology

## 2023-07-13 ENCOUNTER — Ambulatory Visit: Payer: Managed Care, Other (non HMO) | Admitting: Obstetrics and Gynecology

## 2023-07-13 ENCOUNTER — Encounter: Payer: Self-pay | Admitting: Physical Medicine & Rehabilitation

## 2023-07-13 VITALS — BP 126/80 | HR 91 | Ht 65.0 in | Wt 235.0 lb

## 2023-07-13 VITALS — BP 169/101 | HR 91 | Ht 65.0 in | Wt 234.8 lb

## 2023-07-13 DIAGNOSIS — M533 Sacrococcygeal disorders, not elsewhere classified: Secondary | ICD-10-CM | POA: Diagnosis not present

## 2023-07-13 DIAGNOSIS — Z30431 Encounter for routine checking of intrauterine contraceptive device: Secondary | ICD-10-CM | POA: Diagnosis not present

## 2023-07-13 DIAGNOSIS — R3989 Other symptoms and signs involving the genitourinary system: Secondary | ICD-10-CM

## 2023-07-13 MED ORDER — MELOXICAM 7.5 MG PO TABS: 7.5000 mg | ORAL_TABLET | Freq: Every day | ORAL | 0 refills | Status: DC

## 2023-07-13 MED ORDER — SULFAMETHOXAZOLE-TRIMETHOPRIM 800-160 MG PO TABS: 1.0000 | ORAL_TABLET | Freq: Two times a day (BID) | ORAL | 0 refills | Status: DC

## 2023-07-13 MED ORDER — TRAMADOL HCL 50 MG PO TABS: 50.0000 mg | ORAL_TABLET | Freq: Four times a day (QID) | ORAL | 5 refills | Status: DC

## 2023-07-13 NOTE — Progress Notes (Addendum)
Subjective:    Patient ID: Maria Powers, female    DOB: May 13, 1976, 47 y.o.   MRN: [id number]  HPI 47 yo female with hx of chronic right sacroiliac pain who has received short term relief with sacroiliac joint injections under fluoro,intermediate (28m) relief with sacroiliac RFA , who underwent RIght SI fusion per Dr Yevette Edwards ~18 mo ago.  She has had good relief since her fusion surgery but unable to reduce her dose of tramadol  Tizanidine helpful with muscle spasm takes 4-6 per day  Meloxicam bottle found started taking very helpful for pains in general   Pain Inventory Average Pain 5 Pain Right Now 2 My pain is intermittent, constant, sharp, burning, dull, stabbing, and aching  In the last 24 hours, has pain interfered with the following? General activity 0 Relation with others 0 Enjoyment of life 0 What TIME of day is your pain at its worst? evening and night Sleep (in general) Fair  Pain is worse with: unsure Pain improves with: rest and medication Relief from Meds: 8  Family History  Problem Relation Age of Onset   Addison's disease Mother    Diabetes Mother    Bipolar disorder Mother    Fibroids Maternal Aunt    Social History   Socioeconomic History   Marital status: Single    Spouse name: Not on file   Number of children: Not on file   Years of education: Not on file   Highest education level: Not on file  Occupational History   Not on file  Tobacco Use   Smoking status: Former   Smokeless tobacco: Never  Vaping Use   Vaping status: Never Used  Substance and Sexual Activity   Alcohol use: No    Alcohol/week: 0.0 standard drinks of alcohol   Drug use: No   Sexual activity: Not Currently    Partners: Male    Birth control/protection: Abstinence, I.U.D.    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      Spiritual  Beliefs:mormon      Lifestyle: no regular exercise; horrible diet            Social Determinants of Health   Financial Resource Strain: Not on file  Food Insecurity: Not on file  Transportation Needs: Not on file  Physical Activity: Not on file  Stress: Not on file  Social Connections: Not on file   Past Surgical History:  Procedure Laterality Date   KIDNEY STONE SURGERY  09/21/2005   KNEE ARTHROSCOPY Right 09/21/2001   KNEE ARTHROSCOPY Right 09/21/2005   SACROILIAC JOINT FUSION  01/20/2022   SHOULDER ARTHROSCOPY Right 09/21/2005   WISDOM TOOTH EXTRACTION  Highschool   Past Surgical History:  Procedure Laterality Date   KIDNEY STONE SURGERY  09/21/2005   KNEE ARTHROSCOPY Right 09/21/2001   KNEE ARTHROSCOPY Right 09/21/2005   SACROILIAC JOINT FUSION  01/20/2022   SHOULDER ARTHROSCOPY Right 09/21/2005   WISDOM TOOTH EXTRACTION  Highschool   Past Medical History:  Diagnosis Date   Anxiety    Bipolar 1 disorder (HCC)    Chronic pain    back, hip - seeing PMR   Depression    Dysmenorrhea    Hypertension    Obesity    BP (!) 165/103   Pulse 91   Ht 5\' 5"  (1.651 m)   Wt  234 lb 12.8 oz (106.5 kg)   SpO2 96%   BMI 39.07 kg/m    recheck 169/101  Opioid Risk Score:   Fall Risk Score:  `1  Depression screen [facility] 2/9     07/13/2023   10:25 AM 11/26/2022   10:27 AM 11/03/2022    9:12 AM 03/04/2022    3:30 PM 09/29/2021    8:23 AM 08/29/2021   11:16 AM 07/10/2021    2:04 PM  Depression screen PHQ 2/9  Decreased Interest 0 0 1 0 3 0 0  Down, Depressed, Hopeless 0 0 1 0  0 0  PHQ - 2 Score 0 0 2 0 3 0 0  Altered sleeping   3 3 1     Tired, decreased energy   2 1 2     Change in appetite   0 0 0    Feeling bad or failure about yourself    1 0 3    Trouble concentrating   1 0 1    Moving slowly or fidgety/restless   0 0 0    Suicidal thoughts   1 0 3    PHQ-9 Score   10 4 13     Difficult doing work/chores   Somewhat difficult Somewhat difficult       Review of  Systems  Constitutional: Negative.   HENT: Negative.    Eyes: Negative.   Respiratory: Negative.    Cardiovascular: Negative.   Gastrointestinal: Negative.   Endocrine: Negative.   Genitourinary:  Positive for flank pain.  Musculoskeletal:  Positive for back pain.  Skin: Negative.   Allergic/Immunologic: Negative.   Neurological: Negative.   Hematological: Negative.   Psychiatric/Behavioral: Negative.    All other systems reviewed and are negative.      Objective:   Physical Exam General No acute distress Mood and affect are appropriate  Sacral thrust (prone) : Positive Lateral compression: Negative FABER's: Positive right Distraction (supine): Negative Thigh thrust test: Negative  Negative straight leg raise Mild tenderness palpation lumbar paraspinal on the right side. Some tenderness in the gluteal region bilaterally. Motor strength is 5/5 bilateral hip flexor knee extensor ankle dorsiflexor Ambulates without assistive device no evidence of toe drag or knee instability     Assessment & Plan:  #1.  Chronic sacroiliac pain right side status post right sacroiliac fusion.  Overall doing well from a functional standpoint continues to work.  Unfortunately unable to reduce tramadol dose.  She would like to go back on meloxicam we discussed risks and benefits of NSAID use.  Will start with 7.5 mg/day to minimize side effects.  We specifically discussed GI upset kidney function issues.  She has her lab work through her PCP office last BUN/creatinine were normal next scheduled in July. Continue tramadol 50 mg 4 times daily, UDS next visit in 6 months with nurse practitioner PDMP reviewed appropriate

## 2023-07-14 ENCOUNTER — Other Ambulatory Visit: Payer: Self-pay | Admitting: Family Medicine

## 2023-07-14 DIAGNOSIS — G4700 Insomnia, unspecified: Secondary | ICD-10-CM

## 2023-07-15 LAB — URINALYSIS, COMPLETE W/RFL CULTURE
Glucose, UA: NEGATIVE
Hgb urine dipstick: NEGATIVE
Hyaline Cast: NONE SEEN /[LPF]
Leukocyte Esterase: NEGATIVE
Nitrites, Initial: NEGATIVE
RBC / HPF: NONE SEEN /[HPF] (ref 0–2)
Specific Gravity, Urine: 1.025 (ref 1.001–1.035)
pH: 5.5 (ref 5.0–8.0)

## 2023-07-15 LAB — URINE CULTURE
MICRO NUMBER:: 15626530
Result:: NO GROWTH
SPECIMEN QUALITY:: ADEQUATE

## 2023-07-15 LAB — CULTURE INDICATED

## 2023-07-16 NOTE — Telephone Encounter (Signed)
Pt checking on progress of this refill.  says she is out and cannot sleep without it

## 2023-07-18 ENCOUNTER — Encounter: Payer: Self-pay | Admitting: Family Medicine

## 2023-07-19 NOTE — Telephone Encounter (Signed)
zolpidem zolpidem (AMBIEN CR) 12.5 MG CR tablet  Pt called to say she still has not received this Rx and she has been unable to sleep in several days, and is having difficulty managing her days with being so tired.   Pt is asking that this refill please be sent, as soon as possible.

## 2023-07-28 ENCOUNTER — Other Ambulatory Visit: Payer: Self-pay | Admitting: Physical Medicine & Rehabilitation

## 2023-07-30 ENCOUNTER — Encounter: Payer: Self-pay | Admitting: Physical Medicine & Rehabilitation

## 2023-07-30 MED ORDER — TIZANIDINE HCL 2 MG PO CAPS: 2.0000 mg | ORAL_CAPSULE | Freq: Three times a day (TID) | ORAL | 1 refills | Status: DC | PRN

## 2023-08-04 ENCOUNTER — Encounter: Payer: Self-pay | Admitting: Family Medicine

## 2023-08-04 ENCOUNTER — Ambulatory Visit (INDEPENDENT_AMBULATORY_CARE_PROVIDER_SITE_OTHER): Payer: Managed Care, Other (non HMO)

## 2023-08-04 ENCOUNTER — Ambulatory Visit: Payer: Managed Care, Other (non HMO) | Admitting: Family Medicine

## 2023-08-04 VITALS — BP 128/80 | HR 94 | Temp 98.4°F | Resp 12 | Ht 65.0 in | Wt 230.1 lb

## 2023-08-04 DIAGNOSIS — G4700 Insomnia, unspecified: Secondary | ICD-10-CM | POA: Diagnosis not present

## 2023-08-04 DIAGNOSIS — R109 Unspecified abdominal pain: Secondary | ICD-10-CM

## 2023-08-04 MED ORDER — ZOLPIDEM TARTRATE ER 12.5 MG PO TBCR: 12.5000 mg | EXTENDED_RELEASE_TABLET | Freq: Every evening | ORAL | 3 refills | Status: DC | PRN

## 2023-08-04 NOTE — Patient Instructions (Addendum)
A few things to remember from today's visit:  Insomnia, unspecified type - Plan: zolpidem (AMBIEN CR) 12.5 MG CR tablet  Flank pain - Plan: DG Abd 1 View  If you need refills for medications you take chronically, please call your pharmacy. Do not use My Chart to request refills or for acute issues that need immediate attention. If you send a my chart message, it may take a few days to be addressed, specially if I am not in the office.  Please be sure medication list is accurate. If a new problem present, please set up appointment sooner than planned today.

## 2023-08-04 NOTE — Progress Notes (Signed)
Chief Complaint  Patient presents with   Medical Management of Chronic Issues   HPI: Maria Powers is a 47 y.o. female with a PMHx significant for migraines, HTN, bipolar disorder, insomnia, and chronic pain, who is here today for follow up.  Last seen on video on 04/16/2023  Insomnia:  She is currently taking Ambien CR 12.5 mg daily, started last visit. She says it is helping significantly. She says she sleeps ~6 hours when she has the medication, and 0 without.  In the past she has tried different medications: trazodone, Ambien IR, Lunesta, doxepin, Abilify, Seroquel, Xanax, melatonin, and daridorexan  She says she doesn't feel drowsy the next day.  It has been well tolerated and has not noted significant side effects. She mentions she has had some muscle cramps in her legs, but they have become less frequent since she started taking the medication. Negative for edema or erythema. She follows with DR. Wynn Banker for chronic lower back pain, SI dysfunction.  Nephrolithiases:  She mentions she had a kidney stone recently which she believes she passed because her symptoms have dissipated.  She was having pain in her back and left side, radiated to LUQ and LLQ abdomen. Pain has resolved. No associated nausea,vomiting,or changes in bowel movements. She saw her gynecologist for this problem on 07/13/23, UA showed no blood, + calcium Oxalate crystals. Treated recently for UTI with Sulfamethoxazole-Trimethoprim bid x 3 d. Ucx no growth.  Lab Results  Component Value Date   NA 139 11/03/2022   CL 106 11/03/2022   K 3.9 11/03/2022   CO2 24 11/03/2022   BUN 17 11/03/2022   CREATININE 0.78 11/03/2022   GFR 91.49 11/03/2022   CALCIUM 9.3 11/03/2022   ALBUMIN 4.2 11/03/2022   GLUCOSE 90 11/03/2022   Negative for dysuria,gross hematuria,or changes in urinary frequency. She had one in the past in 2007, similar symptoms. She has not had imaging done nor has seen urologist.  She is  not having menstrual periods, she has an IUD, changed on 06/09/23 .   Review of Systems  Constitutional:  Negative for activity change, appetite change, chills and fever.  Respiratory:  Negative for cough and shortness of breath.   Gastrointestinal:  Negative for abdominal pain.  Endocrine: Negative for cold intolerance and heat intolerance.  Musculoskeletal:  Positive for back pain. Negative for gait problem.  Neurological:  Negative for syncope, weakness and headaches.  Psychiatric/Behavioral:  Negative for confusion and hallucinations.   See other pertinent positives and negatives in HPI.  Current Outpatient Medications on File Prior to Visit  Medication Sig Dispense Refill   fluticasone (FLONASE) 50 MCG/ACT nasal spray SPRAY 2 SPRAYS IN EACH NOSTRIL ONCE DAILY AS NEEDED 16 mL 0   gabapentin (NEURONTIN) 600 MG tablet TAKE 1/2 TABLET BY MOUTH THREE TIMES A DAY 45 tablet 2   hydrocortisone (ANUSOL-HC) 2.5 % rectal cream Place 1 Application rectally 2 (two) times daily. 30 g 2   levonorgestrel (MIRENA) 20 MCG/24HR IUD 1 each by Intrauterine route once.     meloxicam (MOBIC) 7.5 MG tablet Take 1 tablet (7.5 mg total) by mouth daily. 30 tablet 0   Multiple Vitamin (MULTIVITAMIN WITH MINERALS) TABS tablet Take 1 tablet by mouth daily.     rizatriptan (MAXALT-MLT) 5 MG disintegrating tablet Take 1 tablet (5 mg total) by mouth as needed. 10 tablet 0   tizanidine (ZANAFLEX) 2 MG capsule Take 1 capsule (2 mg total) by mouth 3 (three) times daily as needed for muscle  spasms. 180 capsule 1   traMADol (ULTRAM) 50 MG tablet Take 1 tablet (50 mg total) by mouth 4 (four) times daily. 120 tablet 5   No current facility-administered medications on file prior to visit.    Past Medical History:  Diagnosis Date   Anxiety    Bipolar 1 disorder (HCC)    Chronic pain    back, hip - seeing PMR   Depression    Dysmenorrhea    Hypertension    Obesity    Allergies  Allergen Reactions    Clindamycin/Lincomycin Nausea And Vomiting   Nubain [Nalbuphine Hcl] Other (See Comments)    *feels like something crawling on her*    Social History   Socioeconomic History   Marital status: Single    Spouse name: Not on file   Number of children: Not on file   Years of education: Not on file   Highest education level: Some college, no degree  Occupational History   Not on file  Tobacco Use   Smoking status: Former   Smokeless tobacco: Never  Vaping Use   Vaping status: Never Used  Substance and Sexual Activity   Alcohol use: No    Alcohol/week: 0.0 standard drinks of alcohol   Drug use: No   Sexual activity: Not Currently    Partners: Male    Birth control/protection: Abstinence, I.U.D.    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      Spiritual Beliefs:mormon      Lifestyle: no regular exercise; horrible diet            Social Determinants of Health   Financial Resource Strain: High Risk (08/03/2023)   Overall Financial Resource Strain (CARDIA)    Difficulty of Paying Living Expenses: Very hard  Food Insecurity: Patient Declined (08/03/2023)   Hunger Vital Sign    Worried About Running Out of Food in the Last Year: Patient declined    Ran Out of Food in the Last Year: Patient declined  Transportation Needs: No Transportation Needs (08/03/2023)   PRAPARE - Administrator, Civil Service (Medical): No    Lack of Transportation (Non-Medical): No  Physical Activity: Unknown (08/03/2023)   Exercise Vital Sign    Days of Exercise per Week: 0 days    Minutes of Exercise per Session: Not on file  Stress: Stress Concern Present (08/03/2023)   [employer] of Occupational Health - Occupational Stress Questionnaire    Feeling of Stress : Very much  Social Connections: Socially Isolated (08/03/2023)   Social  Connection and Isolation Panel [NHANES]    Frequency of Communication with Friends and Family: Once a week    Frequency of Social Gatherings with Friends and Family: Once a week    Attends Religious Services: Never    Database administrator or Organizations: No    Attends Engineer, structural: Not on file    Marital Status: Never married   Vitals:   08/04/23 1012  BP: 128/80  Pulse: 94  Resp: 12  Temp: 98.4 F (36.9 C)  SpO2: 98%   Body mass index is 38.29 kg/m.  Physical Exam Vitals and nursing note reviewed.  Constitutional:      General: She is not in acute distress.    Appearance: She is well-developed.  HENT:     Head:  Normocephalic and atraumatic.  Eyes:     Conjunctiva/sclera: Conjunctivae normal.  Cardiovascular:     Rate and Rhythm: Normal rate and regular rhythm.     Heart sounds: No murmur heard. Pulmonary:     Effort: Pulmonary effort is normal. No respiratory distress.     Breath sounds: Normal breath sounds.  Abdominal:     Palpations: Abdomen is soft. There is no mass.     Tenderness: There is no abdominal tenderness. There is no right CVA tenderness or left CVA tenderness.  Skin:    General: Skin is warm.     Findings: No erythema or rash.  Neurological:     General: No focal deficit present.     Mental Status: She is alert and oriented to person, place, and time.     Gait: Gait normal.  Psychiatric:        Mood and Affect: Mood and affect normal.    ASSESSMENT AND PLAN:  Ms. Sluiter was seen today to discuss sleep medication.   Insomnia, unspecified type Assessment & Plan: Problem has improved, sleeping about 6 hours when she takes Ambien CR 12.5 mg. No changes in current management. We reviewed some side effects. Continue good sleep hygiene. PDMP reviewed. Rx sent. F/U in 6 months, before if needed.  Orders: -     Zolpidem Tartrate ER; Take 1 tablet (12.5 mg total) by mouth at bedtime as needed. for sleep  Dispense: 30 tablet;  Refill: 3  Flank pain Episode has resolved, lasted about 10-14 days. She assumes it was caused by kidney stones, UA negative for blood during episode. She agrees with having a KUB done today. If she develops symptoms again, she will let me know, we may need an abdominal CT. Continue adequate hydration.  -     DG Abd 1 View; Future  Return in about 6 months (around 02/01/2024) for CPE.  I, Suanne Marker, acting as a scribe for Lydia Toren [place], MD., have documented all relevant documentation on the behalf of Myra Weng [place], MD, as directed by  Gianluca Chhim [place], MD while in the presence of Farra Nikolic [place], MD.   I, Ernestine Rohman [place], MD, have reviewed all documentation for this visit. The documentation on 08/05/23 for the exam, diagnosis, procedures, and orders are all accurate and complete.  Kamilya Wakeman G. [place], MD  [facility]. [place] office.

## 2023-08-05 NOTE — Assessment & Plan Note (Signed)
Problem has improved, sleeping about 6 hours when she takes Ambien CR 12.5 mg. No changes in current management. We reviewed some side effects. Continue good sleep hygiene. PDMP reviewed. Rx sent. F/U in 6 months, before if needed.

## 2023-08-10 ENCOUNTER — Other Ambulatory Visit: Payer: Self-pay | Admitting: Physical Medicine & Rehabilitation

## 2023-08-11 ENCOUNTER — Other Ambulatory Visit: Payer: Self-pay | Admitting: Physical Medicine & Rehabilitation

## 2023-08-14 ENCOUNTER — Other Ambulatory Visit: Payer: Self-pay | Admitting: Family Medicine

## 2023-08-14 DIAGNOSIS — G4700 Insomnia, unspecified: Secondary | ICD-10-CM

## 2023-08-27 ENCOUNTER — Ambulatory Visit
Admission: RE | Admit: 2023-08-27 | Discharge: 2023-08-27 | Disposition: A | Discharge status: Home / Self Care | Payer: Managed Care, Other (non HMO) | Source: Ambulatory Visit | Attending: Physical Medicine & Rehabilitation | Admitting: Physical Medicine & Rehabilitation

## 2023-08-27 ENCOUNTER — Encounter
Payer: Managed Care, Other (non HMO) | Attending: Physical Medicine & Rehabilitation | Admitting: Physical Medicine & Rehabilitation

## 2023-08-27 ENCOUNTER — Encounter: Payer: Self-pay | Admitting: Physical Medicine & Rehabilitation

## 2023-08-27 ENCOUNTER — Telehealth: Payer: Self-pay

## 2023-08-27 ENCOUNTER — Ambulatory Visit: Payer: Managed Care, Other (non HMO)

## 2023-08-27 VITALS — BP 189/110 | HR 79 | Ht 65.0 in | Wt 235.0 lb

## 2023-08-27 VITALS — BP 128/80

## 2023-08-27 DIAGNOSIS — M545 Low back pain, unspecified: Secondary | ICD-10-CM | POA: Insufficient documentation

## 2023-08-27 DIAGNOSIS — G8929 Other chronic pain: Secondary | ICD-10-CM | POA: Insufficient documentation

## 2023-08-27 DIAGNOSIS — M533 Sacrococcygeal disorders, not elsewhere classified: Secondary | ICD-10-CM | POA: Diagnosis present

## 2023-08-27 DIAGNOSIS — I10 Essential (primary) hypertension: Secondary | ICD-10-CM

## 2023-08-27 MED ORDER — TIZANIDINE HCL 4 MG PO TABS: 4.0000 mg | ORAL_TABLET | Freq: Three times a day (TID) | ORAL | 3 refills | Status: DC | PRN

## 2023-08-27 MED ORDER — KETOROLAC TROMETHAMINE 60 MG/2ML IM SOLN
60.0000 mg | Freq: Once | INTRAMUSCULAR | Status: AC
  Administered 2023-08-27: 60 mg via INTRAMUSCULAR

## 2023-08-27 NOTE — Progress Notes (Signed)
Subjective:    Patient ID: Maria Powers, female    DOB: 01/28/1976, 47 y.o.   MRN: [id number]  HPI C/o increased low back pain increased over the last 2 wk No fall or injury  Pt c/o bilateral LBP , has radiating pain down RLE to ankle  No numbness or tingling  Takes tramadol QID Tizanidine 2mg  TID Mobic 7.5 mg every day   Pain Inventory Average Pain 3 Pain Right Now 8 My pain is constant, sharp, stabbing, and aching  In the last 24 hours, has pain interfered with the following? General activity 8 Relation with others 8 Enjoyment of life 8 What TIME of day is your pain at its worst? morning , daytime, evening, and night Sleep (in general) Fair  Pain is worse with: walking, bending, sitting, inactivity, and standing Pain improves with: medication Relief from Meds: 3  Family History  Problem Relation Age of Onset   Addison's disease Mother    Diabetes Mother    Bipolar disorder Mother    Fibroids Maternal Aunt    Social History   Socioeconomic History   Marital status: Single    Spouse name: Not on file   Number of children: Not on file   Years of education: Not on file   Highest education level: Some college, no degree  Occupational History   Not on file  Tobacco Use   Smoking status: Former   Smokeless tobacco: Never  Vaping Use   Vaping status: Never Used  Substance and Sexual Activity   Alcohol use: No    Alcohol/week: 0.0 standard drinks of alcohol   Drug use: No   Sexual activity: Not Currently    Partners: Male    Birth control/protection: Abstinence, I.U.D.    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      Spiritual Beliefs:mormon      Lifestyle: no regular exercise; horrible diet            Social Determinants of Health   Financial Resource Strain: High Risk (08/03/2023)   Overall Financial  Resource Strain (CARDIA)    Difficulty of Paying Living Expenses: Very hard  Food Insecurity: Patient Declined (08/03/2023)   Hunger Vital Sign    Worried About Running Out of Food in the Last Year: Patient declined    Ran Out of Food in the Last Year: Patient declined  Transportation Needs: No Transportation Needs (08/03/2023)   PRAPARE - Administrator, Civil Service (Medical): No    Lack of Transportation (Non-Medical): No  Physical Activity: Unknown (08/03/2023)   Exercise Vital Sign    Days of Exercise per Week: 0 days    Minutes of Exercise per Session: Not on file  Stress: Stress Concern Present (08/03/2023)   [employer] of Occupational Health - Occupational Stress Questionnaire    Feeling of Stress : Very much  Social Connections: Socially Isolated (08/03/2023)   Social Connection and Isolation Panel [NHANES]    Frequency of Communication with Friends and Family: Once a week    Frequency of Social Gatherings with Friends and Family: Once a week    Attends Religious Services: Never    Database administrator or Organizations: No    Attends Engineer, structural: Not on file    Marital Status: Never married   Past Surgical  History:  Procedure Laterality Date   KIDNEY STONE SURGERY  09/21/2005   KNEE ARTHROSCOPY Right 09/21/2001   KNEE ARTHROSCOPY Right 09/21/2005   SACROILIAC JOINT FUSION  01/20/2022   SHOULDER ARTHROSCOPY Right 09/21/2005   WISDOM TOOTH EXTRACTION  Highschool   Past Surgical History:  Procedure Laterality Date   KIDNEY STONE SURGERY  09/21/2005   KNEE ARTHROSCOPY Right 09/21/2001   KNEE ARTHROSCOPY Right 09/21/2005   SACROILIAC JOINT FUSION  01/20/2022   SHOULDER ARTHROSCOPY Right 09/21/2005   WISDOM TOOTH EXTRACTION  Highschool   Past Medical History:  Diagnosis Date   Anxiety    Bipolar 1 disorder (HCC)    Chronic pain    back, hip - seeing PMR   Depression    Dysmenorrhea    Hypertension    Obesity    BP (!)  191/111   Pulse 79   Ht 5\' 5"  (1.651 m)   Wt 235 lb (106.6 kg)   SpO2 98%   BMI 39.11 kg/m   Opioid Risk Score:   Fall Risk Score:  `1  Depression screen [facility] 2/9     08/27/2023    9:16 AM 07/13/2023   10:25 AM 11/26/2022   10:27 AM 11/03/2022    9:12 AM 03/04/2022    3:30 PM 09/29/2021    8:23 AM 08/29/2021   11:16 AM  Depression screen PHQ 2/9  Decreased Interest 0 0 0 1 0 3 0  Down, Depressed, Hopeless 0 0 0 1 0  0  PHQ - 2 Score 0 0 0 2 0 3 0  Altered sleeping    3 3 1    Tired, decreased energy    2 1 2    Change in appetite    0 0 0   Feeling bad or failure about yourself     1 0 3   Trouble concentrating    1 0 1   Moving slowly or fidgety/restless    0 0 0   Suicidal thoughts    1 0 3   PHQ-9 Score    10 4 13    Difficult doing work/chores    Somewhat difficult Somewhat difficult      Review of Systems  Musculoskeletal:  Positive for back pain.       Left leg pain from  back down to left foot      Objective:   Physical Exam  Sensation mildly reduced R L4 General No acute distress Mood and affect appropriate Extremities without edema Motor strength is 5/5 bilateral hip flexor knee extensor ankle dorsiflexor. Negative straight leg raising bilaterally Ambulates without assistive device no evidence of toe drag or knee instability Lumbar spine has tenderness palpation right greater than left lumbar paraspinals t from L1-L5.  There is also PSIS tenderness on the right side. Sacral thrust (prone) : Positive right Lateral compression: Negative FABER's: Positive right however this is more in the lumbar area above the waistline Distraction (supine): Negative Thigh thrust test: Positive on the right     Assessment & Plan:   #1.  Acute exacerbation of chronic low back pain.  Likely multifactorial.  She does have chronic sacroiliac pain which has persisted even after her sacroiliac fusion.  She did say that the sacroiliac fusion was helpful in alleviating some of the  deeper pain.  She also has pain in the upper lumbar area, difficult to say with that this is muscular or has some underlying degenerative changes in the lumbar spine.  Will check some x-rays.  Give Toradol 60 mg IM x 1 today Follow-up in 1 month.  Consider physical therapy if no improvement Also the patient states that she switched to tizanidine capsules and is taking 2 mg 3 times a day rather than 4 mg.  This may have some factor with her pain complaints.  She states that the pharmacy now stocks the tablets once again we will write a prescription for tizanidine 4 mg tablets 1 p.o. 3 times daily as needed Continue tramadol UDS next visit

## 2023-08-27 NOTE — Telephone Encounter (Signed)
Received message from patient's pain management provider that her BP was high during OV today. I called and spoke with patient. Her BP is usually always high at their office. Patient denies any chest pain, headache or palpitations. Pt will stop by office on her way home to have BP re-checked.

## 2023-08-27 NOTE — Progress Notes (Signed)
Pt came into have her BP checked after having high reading at her pain management appt. Re-check here in office was 128/80. PCP is aware. Pt denied any chest pain, palpitations, or headaches.

## 2023-08-30 ENCOUNTER — Encounter: Payer: Self-pay | Admitting: Physical Medicine & Rehabilitation

## 2023-08-30 MED ORDER — PREDNISONE 10 MG (21) PO TBPK: ORAL_TABLET | Freq: Every day | ORAL | 1 refills | Status: DC

## 2023-08-30 MED ORDER — PREDNISONE 10 MG PO TABS: ORAL_TABLET | ORAL | 1 refills | Status: DC

## 2023-08-30 NOTE — Addendum Note (Signed)
Addended by: Doreene Eland on: 08/30/2023 01:37 PM   Modules accepted: Orders

## 2023-08-31 ENCOUNTER — Other Ambulatory Visit: Payer: Self-pay | Admitting: Physical Medicine & Rehabilitation

## 2023-09-08 ENCOUNTER — Other Ambulatory Visit: Payer: Self-pay | Admitting: Physical Medicine & Rehabilitation

## 2023-09-24 ENCOUNTER — Ambulatory Visit: Payer: Managed Care, Other (non HMO) | Admitting: Physical Medicine & Rehabilitation

## 2023-10-15 ENCOUNTER — Encounter: Payer: Self-pay | Admitting: Physical Medicine & Rehabilitation

## 2023-10-15 ENCOUNTER — Encounter
Payer: Managed Care, Other (non HMO) | Attending: Physical Medicine & Rehabilitation | Admitting: Physical Medicine & Rehabilitation

## 2023-10-15 VITALS — BP 134/85 | HR 67 | Ht 65.0 in | Wt 229.0 lb

## 2023-10-15 DIAGNOSIS — G8929 Other chronic pain: Secondary | ICD-10-CM | POA: Diagnosis present

## 2023-10-15 DIAGNOSIS — M545 Low back pain, unspecified: Secondary | ICD-10-CM | POA: Insufficient documentation

## 2023-10-15 DIAGNOSIS — M533 Sacrococcygeal disorders, not elsewhere classified: Secondary | ICD-10-CM | POA: Diagnosis not present

## 2023-10-15 DIAGNOSIS — M79604 Pain in right leg: Secondary | ICD-10-CM | POA: Diagnosis not present

## 2023-10-15 NOTE — Progress Notes (Signed)
Subjective:    Patient ID: Maria Powers, female    DOB: 11/28/1975, 48 y.o.   MRN: [id number]  HPI 48 year old female with history of chronic sacroiliac pain.  She had a flareup of her pain with increased radiation down the right lower extremity last month. Flare up in RLE last month responded well to prednisone  THe pt has persistent numbness in the area adjacent to surgical incision right knee.   No falls or new trauma.  She continues on her medication regimen for pain which includes Meloxicam 7.5 mg daily we discussed the overall goal to get off of this medication if possible.  Her kidney functions were reviewed and look okay.  No GI discomfort Tizanidine 4 mg 3 times daily Tramadol 50 mg 4 times daily Gabapentin 300 mg 3 times daily Pain Inventory Average Pain 4 Pain Right Now 4 My pain is sharp, burning, dull, stabbing, tingling, and aching  In the last 24 hours, has pain interfered with the following? General activity 0 Relation with others 0 Enjoyment of life 0 What TIME of day is your pain at its worst? varies Sleep (in general) Fair  Pain is worse with: walking, bending, standing, and some activites Pain improves with: rest and medication Relief from Meds: 6  Family History  Problem Relation Age of Onset   Addison's disease Mother    Diabetes Mother    Bipolar disorder Mother    Fibroids Maternal Aunt    Social History   Socioeconomic History   Marital status: Single    Spouse name: Not on file   Number of children: Not on file   Years of education: Not on file   Highest education level: Some college, no degree  Occupational History   Not on file  Tobacco Use   Smoking status: Former   Smokeless tobacco: Never  Vaping Use   Vaping status: Never Used  Substance and Sexual Activity   Alcohol use: No    Alcohol/week: 0.0 standard drinks of alcohol   Drug use: No   Sexual activity: Not Currently    Partners: Male    Birth control/protection:  Abstinence, I.U.D.    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      Spiritual Beliefs:mormon      Lifestyle: no regular exercise; horrible diet            Social Drivers of Health   Financial Resource Strain: High Risk (08/03/2023)   Overall Financial Resource Strain (CARDIA)    Difficulty of Paying Living Expenses: Very hard  Food Insecurity: Patient Declined (08/03/2023)   Hunger Vital Sign    Worried About Running Out of Food in the Last Year: Patient declined    Ran Out of Food in the Last Year: Patient declined  Transportation Needs: No Transportation Needs (08/03/2023)   PRAPARE - Administrator, Civil Service (Medical): No    Lack of Transportation (Non-Medical): No  Physical Activity: Unknown (08/03/2023)   Exercise Vital Sign    Days of Exercise per Week: 0 days    Minutes of Exercise per Session: Not on file  Stress: Stress Concern Present (08/03/2023)   [employer] of Occupational Health - Occupational Stress Questionnaire    Feeling of Stress : Very much  Social Connections: Socially Isolated (08/03/2023)   Social Connection and  Isolation Panel [NHANES]    Frequency of Communication with Friends and Family: Once a week    Frequency of Social Gatherings with Friends and Family: Once a week    Attends Religious Services: Never    Database administrator or Organizations: No    Attends Engineer, structural: Not on file    Marital Status: Never married   Past Surgical History:  Procedure Laterality Date   KIDNEY STONE SURGERY  09/21/2005   KNEE ARTHROSCOPY Right 09/21/2001   KNEE ARTHROSCOPY Right 09/21/2005   SACROILIAC JOINT FUSION  01/20/2022   SHOULDER ARTHROSCOPY Right 09/21/2005   WISDOM TOOTH EXTRACTION  Highschool   Past Surgical History:  Procedure Laterality Date   KIDNEY STONE  SURGERY  09/21/2005   KNEE ARTHROSCOPY Right 09/21/2001   KNEE ARTHROSCOPY Right 09/21/2005   SACROILIAC JOINT FUSION  01/20/2022   SHOULDER ARTHROSCOPY Right 09/21/2005   WISDOM TOOTH EXTRACTION  Highschool   Past Medical History:  Diagnosis Date   Anxiety    Bipolar 1 disorder (HCC)    Chronic pain    back, hip - seeing PMR   Depression    Dysmenorrhea    Hypertension    Obesity    BP 134/85   Pulse 67   Ht 5\' 5"  (1.651 m)   Wt 229 lb (103.9 kg)   SpO2 96%   BMI 38.11 kg/m   Opioid Risk Score:   Fall Risk Score:  `1  Depression screen [facility] 2/9     10/15/2023   10:35 AM 08/27/2023    9:16 AM 07/13/2023   10:25 AM 11/26/2022   10:27 AM 11/03/2022    9:12 AM 03/04/2022    3:30 PM 09/29/2021    8:23 AM  Depression screen PHQ 2/9  Decreased Interest 0 0 0 0 1 0 3  Down, Depressed, Hopeless 0 0 0 0 1 0   PHQ - 2 Score 0 0 0 0 2 0 3  Altered sleeping     3 3 1   Tired, decreased energy     2 1 2   Change in appetite     0 0 0  Feeling bad or failure about yourself      1 0 3  Trouble concentrating     1 0 1  Moving slowly or fidgety/restless     0 0 0  Suicidal thoughts     1 0 3  PHQ-9 Score     10 4 13   Difficult doing work/chores     Somewhat difficult Somewhat difficult       Review of Systems  Musculoskeletal:  Positive for back pain.  All other systems reviewed and are negative.     Objective:   Physical Exam Vitals and nursing note reviewed.  Constitutional:      Appearance: She is obese.  HENT:     Head: Normocephalic and atraumatic.  Eyes:     Extraocular Movements: Extraocular movements intact.     Conjunctiva/sclera: Conjunctivae normal.     Pupils: Pupils are equal, round, and reactive to light.  Musculoskeletal:        General: No swelling or tenderness.     Right lower leg: No edema.     Left lower leg: No edema.     Comments: Tenderness palpation in the lumbar paraspinal region right greater than left side this is around L4 and even L3 area.   There is no tenderness around the PSIS at this time. Pain in  the back increases with lumbar extension as well as right lateral bending. No pain with forward flexion.  Normal forward flexion range of motion. No pain with hip internal/external rotation.  No pain with knee or ankle range of motion  Neurological:     Mental Status: She is alert and oriented to person, place, and time.  Psychiatric:        Mood and Affect: Mood normal.        Behavior: Behavior normal.    Sensation intact to pinprick in both lower limbs except a patch at the right lateral knee       Assessment & Plan:   1.  History of chronic sacroiliac pain improved after fusion 2.  Lumbar spondylosis without myelopathy her last MRI was in 2023 which I do not have access to.  Question if she has any foraminal stenosis that could cause some intermittent sciatic symptoms. For the patch of reduced sensation at the right lateral knee is likely related to the knee incision. At this point she is doing reasonably well we will continue with current medication regimen.  If she does have some worsening of her low back pain I would consider lumbar medial branch blocks on the right side L3-4-5

## 2023-10-15 NOTE — Patient Instructions (Signed)
Back Exercises These exercises help to make your trunk and back strong. They also help to keep the lower back flexible. Doing these exercises can help to prevent or lessen pain in your lower back. If you have back pain, try to do these exercises 2-3 times each day or as told by your doctor. As you get better, do the exercises once each day. Repeat the exercises more often as told by your doctor. To stop back pain from coming back, do the exercises once each day, or as told by your doctor. Do exercises exactly as told by your doctor. Stop right away if you feel sudden pain or your pain gets worse. Exercises Single knee to chest Do these steps 3-5 times in a row for each leg: Lie on your back on a firm bed or the floor with your legs stretched out. Bring one knee to your chest. Grab your knee or thigh with both hands and hold it in place. Pull on your knee until you feel a gentle stretch in your lower back or butt. Keep doing the stretch for 10-30 seconds. Slowly let go of your leg and straighten it. Pelvic tilt Do these steps 5-10 times in a row: Lie on your back on a firm bed or the floor with your legs stretched out. Bend your knees so they point up to the ceiling. Your feet should be flat on the floor. Tighten your lower belly (abdomen) muscles to press your lower back against the floor. This will make your tailbone point up to the ceiling instead of pointing down to your feet or the floor. Stay in this position for 5-10 seconds while you gently tighten your muscles and breathe evenly.   Bridges Do these steps 10 times in a row: Lie on your back on a firm bed or the floor. Bend your knees so they point up to the ceiling. Your feet should be flat on the floor. Your arms should be flat at your sides, next to your body. Tighten your butt muscles and lift your butt off the floor until your waist is almost as high as your knees. If you do not feel the muscles working in your butt and the back  of your thighs, slide your feet 1-2 inches (2.5-5 cm) farther away from your butt. Stay in this position for 3-5 seconds. Slowly lower your butt to the floor, and let your butt muscles relax. If this exercise is too easy, try doing it with your arms crossed over your chest. Belly crunches Do these steps 5-10 times in a row: Lie on your back on a firm bed or the floor with your legs stretched out. Bend your knees so they point up to the ceiling. Your feet should be flat on the floor. Cross your arms over your chest. Tip your chin a little bit toward your chest, but do not bend your neck. Tighten your belly muscles and slowly raise your chest just enough to lift your shoulder blades a tiny bit off the floor. Avoid raising your body higher than that because it can put too much stress on your lower back. Slowly lower your chest and your head to the floor.  Contact a doctor if: Your back pain gets a lot worse when you do an exercise. Your back pain does not get better within 2 hours after you exercise. If you have any of these problems, stop doing the exercises. Do not do them again unless your doctor says it is okay. Get  help right away if: You have sudden, very bad back pain. If this happens, stop doing the exercises. Do not do them again unless your doctor says it is okay. This information is not intended to replace advice given to you by your health care provider. Make sure you discuss any questions you have with your health care provider. Document Revised: 11/20/2020 Document Reviewed: 11/20/2020 Elsevier Patient Education  2024 [employer].

## 2023-10-25 ENCOUNTER — Encounter: Payer: Self-pay | Admitting: Obstetrics and Gynecology

## 2023-10-25 NOTE — Progress Notes (Deleted)
 48 y.o. G94P1001 Single Caucasian female here for annual exam.    PCP: [place], Betty G, MD   No LMP recorded. (Menstrual status: IUD).           Sexually active: No.  The current method of family planning is IUD--Mirena 06/09/23.    Menopausal hormone therapy:  n/a Exercising: {yes no:314532}  {types:19826} Smoker:  former  OB History  Gravida Para Term Preterm AB Living  1 1 1   1   SAB IAB Ectopic Multiple Live Births      1    # Outcome Date GA Lbr Len/2nd Weight Sex Type Anes PTL Lv  1 Term 09/1998 [redacted]w[redacted]d   F Vag-Spont   LIV     Birth Comments: Put up for adoption     HEALTH MAINTENANCE: Last 2 paps:  02/22/20 neg: HR HPV neg, 07-12-14 Ascus:neg  History of abnormal Pap or positive HPV:  yes, 07-12-14 Ascus:neg  Mammogram:   n/a Colonoscopy:  n/a Bone Density:  n/a  Result  n/a   Immunization History  Administered Date(s) Administered   Influenza Split 08/10/2012   Influenza,inj,Quad PF,6+ Mos 07/10/2013, 07/05/2014, 07/24/2015, 07/17/2017, 07/06/2018, 06/09/2019, 06/09/2019   Influenza-Unspecified 07/26/2016, 07/06/2018, 07/24/2020, 06/07/2021, 07/16/2022, 07/02/2023   PFIZER(Purple Top)SARS-COV-2 Vaccination 12/14/2019, 01/09/2020   Pfizer Covid-19 Vaccine Bivalent Booster 33yrs & up 06/07/2021   Tdap 07/12/2014      reports that she has quit smoking. She has never used smokeless tobacco. She reports that she does not drink alcohol and does not use drugs.  Past Medical History:  Diagnosis Date   Anxiety    Bipolar 1 disorder (HCC)    Chronic pain    back, hip - seeing PMR   Depression    Dysmenorrhea    Hypertension    Obesity     Past Surgical History:  Procedure Laterality Date   KIDNEY STONE SURGERY  09/21/2005   KNEE ARTHROSCOPY Right 09/21/2001   KNEE ARTHROSCOPY Right 09/21/2005   SACROILIAC JOINT FUSION  01/20/2022   SHOULDER ARTHROSCOPY Right 09/21/2005   WISDOM TOOTH EXTRACTION  Highschool    Current Outpatient Medications  Medication Sig  Dispense Refill   fluticasone (FLONASE) 50 MCG/ACT nasal spray SPRAY 2 SPRAYS IN EACH NOSTRIL ONCE DAILY AS NEEDED 16 mL 0   gabapentin (NEURONTIN) 600 MG tablet TAKE 1/2 TABLET BY MOUTH THREE TIMES A DAY 45 tablet 2   hydrocortisone (ANUSOL-HC) 2.5 % rectal cream Place 1 Application rectally 2 (two) times daily. 30 g 2   levonorgestrel (MIRENA) 20 MCG/24HR IUD 1 each by Intrauterine route once.     meloxicam (MOBIC) 7.5 MG tablet TAKE 1 TABLET BY MOUTH DAILY 30 tablet 3   Multiple Vitamin (MULTIVITAMIN WITH MINERALS) TABS tablet Take 1 tablet by mouth daily.     predniSONE (DELTASONE) 10 MG tablet Take by mouth daily. 4 tablets by mouth every morning for 3 days then 3 tablets by mouth every morning for 3 days then 2 tablets by mouth every morning for 3 days then 1 tablet by mouth every morning for 3 days then Stop 30 tablet 1   rizatriptan (MAXALT-MLT) 5 MG disintegrating tablet Take 1 tablet (5 mg total) by mouth as needed. 10 tablet 0   tiZANidine (ZANAFLEX) 4 MG tablet Take 1 tablet (4 mg total) by mouth every 8 (eight) hours as needed for muscle spasms. 90 tablet 3   traMADol (ULTRAM) 50 MG tablet Take 1 tablet (50 mg total) by mouth 4 (four) times daily.  120 tablet 5   zolpidem (AMBIEN CR) 12.5 MG CR tablet Take 1 tablet (12.5 mg total) by mouth at bedtime as needed. for sleep 30 tablet 3   No current facility-administered medications for this visit.    ALLERGIES: Clindamycin/lincomycin and Nubain [nalbuphine hcl]  Family History  Problem Relation Age of Onset   Addison's disease Mother    Diabetes Mother    Bipolar disorder Mother    Fibroids Maternal Aunt     Review of Systems  PHYSICAL EXAM:  There were no vitals taken for this visit.    General appearance: alert, cooperative and appears stated age Head: normocephalic, without obvious abnormality, atraumatic Neck: no adenopathy, supple, symmetrical, trachea midline and thyroid normal to inspection and palpation Lungs:  clear to auscultation bilaterally Breasts: normal appearance, no masses or tenderness, No nipple retraction or dimpling, No nipple discharge or bleeding, No axillary adenopathy Heart: regular rate and rhythm Abdomen: soft, non-tender; no masses, no organomegaly Extremities: extremities normal, atraumatic, no cyanosis or edema Skin: skin color, texture, turgor normal. No rashes or lesions Lymph nodes: cervical, supraclavicular, and axillary nodes normal. Neurologic: grossly normal  Pelvic: External genitalia:  no lesions              No abnormal inguinal nodes palpated.              Urethra:  normal appearing urethra with no masses, tenderness or lesions              Bartholins and Skenes: normal                 Vagina: normal appearing vagina with normal color and discharge, no lesions              Cervix: no lesions              Pap taken: {yes no:314532} Bimanual Exam:  Uterus:  normal size, contour, position, consistency, mobility, non-tender              Adnexa: no mass, fullness, tenderness              Rectal exam: {yes no:314532}.  Confirms.              Anus:  normal sphincter tone, no lesions  Chaperone was present for exam:  {BSCHAPERONE:31226::"Marycatherine Maniscalco F, CMA"}  ASSESSMENT: Well woman visit with gynecologic exam  ***  PLAN: Mammogram screening discussed. Self breast awareness reviewed. Pap and HRV collected:  {yes no:314532} Guidelines for Calcium, Vitamin D, regular exercise program including cardiovascular and weight bearing exercise. Medication refills:  *** {LABS (Optional):23779} Follow up:  ***    Additional counseling given.  {yes [id number]. ***  total time was spent for this patient encounter, including preparation, face-to-face counseling with the patient, coordination of care, and documentation of the encounter in addition to doing the well woman visit with gynecologic exam.

## 2023-11-08 ENCOUNTER — Ambulatory Visit: Payer: Managed Care, Other (non HMO) | Admitting: Obstetrics and Gynecology

## 2023-11-08 ENCOUNTER — Ambulatory Visit (INDEPENDENT_AMBULATORY_CARE_PROVIDER_SITE_OTHER): Payer: Managed Care, Other (non HMO) | Admitting: Family Medicine

## 2023-11-08 VITALS — BP 128/80 | HR 82 | Resp 12 | Ht 65.0 in | Wt 225.1 lb

## 2023-11-08 DIAGNOSIS — Z1211 Encounter for screening for malignant neoplasm of colon: Secondary | ICD-10-CM | POA: Diagnosis not present

## 2023-11-08 DIAGNOSIS — R7303 Prediabetes: Secondary | ICD-10-CM

## 2023-11-08 DIAGNOSIS — E785 Hyperlipidemia, unspecified: Secondary | ICD-10-CM | POA: Insufficient documentation

## 2023-11-08 DIAGNOSIS — G4700 Insomnia, unspecified: Secondary | ICD-10-CM

## 2023-11-08 DIAGNOSIS — Z0000 Encounter for general adult medical examination without abnormal findings: Secondary | ICD-10-CM

## 2023-11-08 MED ORDER — DOXEPIN HCL 25 MG PO CAPS: 25.0000 mg | ORAL_CAPSULE | Freq: Every evening | ORAL | 1 refills | Status: DC | PRN

## 2023-11-08 NOTE — Patient Instructions (Addendum)
 A few things to remember from today's visit:  Routine general medical examination at a health care facility  Colon cancer screening - Plan: Ambulatory referral to Gastroenterology  Prediabetes  Hyperlipidemia, unspecified hyperlipidemia type  Insomnia, unspecified type - Plan: doxepin (SINEQUAN) 25 MG capsule  Please let me know if adding Doxepin is not helping and which psychiatrist is under you insurance net work.  If you need refills for medications you take chronically, please call your pharmacy. Do not use My Chart to request refills or for acute issues that need immediate attention. If you send a my chart message, it may take a few days to be addressed, specially if I am not in the office.  Please be sure medication list is accurate. If a new problem present, please set up appointment sooner than planned today.  Health Maintenance, Female Adopting a healthy lifestyle and getting preventive care are important in promoting health and wellness. Ask your health care provider about: The right schedule for you to have regular tests and exams. Things you can do on your own to prevent diseases and keep yourself healthy. What should I know about diet, weight, and exercise? Eat a healthy diet  Eat a diet that includes plenty of vegetables, fruits, low-fat dairy products, and lean protein. Do not eat a lot of foods that are high in solid fats, added sugars, or sodium. Maintain a healthy weight Body mass index (BMI) is used to identify weight problems. It estimates body fat based on height and weight. Your health care provider can help determine your BMI and help you achieve or maintain a healthy weight. Get regular exercise Get regular exercise. This is one of the most important things you can do for your health. Most adults should: Exercise for at least 150 minutes each week. The exercise should increase your heart rate and make you sweat (moderate-intensity exercise). Do strengthening  exercises at least twice a week. This is in addition to the moderate-intensity exercise. Spend less time sitting. Even light physical activity can be beneficial. Watch cholesterol and blood lipids Have your blood tested for lipids and cholesterol at 47 years of age, then have this test every 5 years. Have your cholesterol levels checked more often if: Your lipid or cholesterol levels are high. You are older than 47 years of age. You are at high risk for heart disease. What should I know about cancer screening? Depending on your health history and family history, you may need to have cancer screening at various ages. This may include screening for: Breast cancer. Cervical cancer. Colorectal cancer. Skin cancer. Lung cancer. What should I know about heart disease, diabetes, and high blood pressure? Blood pressure and heart disease High blood pressure causes heart disease and increases the risk of stroke. This is more likely to develop in people who have high blood pressure readings or are overweight. Have your blood pressure checked: Every 3-5 years if you are 24-55 years of age. Every year if you are 37 years old or older. Diabetes Have regular diabetes screenings. This checks your fasting blood sugar level. Have the screening done: Once every three years after age 66 if you are at a normal weight and have a low risk for diabetes. More often and at a younger age if you are overweight or have a high risk for diabetes. What should I know about preventing infection? Hepatitis B If you have a higher risk for hepatitis B, you should be screened for this virus. Talk with your  health care provider to find out if you are at risk for hepatitis B infection. Hepatitis C Testing is recommended for: Everyone born from [birth year] through 1964. Anyone with known risk factors for hepatitis C. Sexually transmitted infections (STIs) Get screened for STIs, including gonorrhea and chlamydia, if: You are  sexually active and are younger than 47 years of age. You are older than 47 years of age and your health care provider tells you that you are at risk for this type of infection. Your sexual activity has changed since you were last screened, and you are at increased risk for chlamydia or gonorrhea. Ask your health care provider if you are at risk. Ask your health care provider about whether you are at high risk for HIV. Your health care provider may recommend a prescription medicine to help prevent HIV infection. If you choose to take medicine to prevent HIV, you should first get tested for HIV. You should then be tested every 3 months for as long as you are taking the medicine. Pregnancy If you are about to stop having your period (premenopausal) and you may become pregnant, seek counseling before you get pregnant. Take 400 to 800 micrograms (mcg) of folic acid every day if you become pregnant. Ask for birth control (contraception) if you want to prevent pregnancy. Osteoporosis and menopause Osteoporosis is a disease in which the bones lose minerals and strength with aging. This can result in bone fractures. If you are 54 years old or older, or if you are at risk for osteoporosis and fractures, ask your health care provider if you should: Be screened for bone loss. Take a calcium or vitamin D supplement to lower your risk of fractures. Be given hormone replacement therapy (HRT) to treat symptoms of menopause. Follow these instructions at home: Alcohol use Do not drink alcohol if: Your health care provider tells you not to drink. You are pregnant, may be pregnant, or are planning to become pregnant. If you drink alcohol: Limit how much you have to: 0-1 drink a day. Know how much alcohol is in your drink. In the [place] one drink equals one 12 oz bottle of beer (355 mL), one 5 oz glass of wine (148 mL), or one 1 oz glass of hard liquor (44 mL). Lifestyle Do not use any products that contain  nicotine or tobacco. These products include cigarettes, chewing tobacco, and vaping devices, such as e-cigarettes. If you need help quitting, ask your health care provider. Do not use street drugs. Do not share needles. Ask your health care provider for help if you need support or information about quitting drugs. General instructions Schedule regular health, dental, and eye exams. Stay current with your vaccines. Tell your health care provider if: You often feel depressed. You have ever been abused or do not feel safe at home. Summary Adopting a healthy lifestyle and getting preventive care are important in promoting health and wellness. Follow your health care provider's instructions about healthy diet, exercising, and getting tested or screened for diseases. Follow your health care provider's instructions on monitoring your cholesterol and blood pressure. This information is not intended to replace advice given to you by your health care provider. Make sure you discuss any questions you have with your health care provider. Document Revised: 01/27/2021 Document Reviewed: 01/27/2021 Elsevier Patient Education  2024 [employer].

## 2023-11-08 NOTE — Progress Notes (Signed)
 HPI: [name] is a 48 y.o. female with a PMHx significant for migraines, HTN, bipolar disorder, insomnia, and chronic pain, who is here today for her routine physical.  Last CPE: 11/03/2022  Exercise: Patient admits she has not been exercising, but is active at work.  Diet: She frequently eats bread and salad from work. She eats vegetables daily.  Sleep: She is only sleeping 2-3 hours per night with the Ambien CR.  Alcohol Use: none Smoking: none Vision: She is due for an appointment with her eye doctor.   Dental: Also due for an appointment with her dentist.  She follows regularly with gynecology, but missed an appointment with them this morning.   Immunization History  Administered Date(s) Administered   Influenza Split 08/10/2012   Influenza,inj,Quad PF,6+ Mos 07/10/2013, 07/05/2014, 07/24/2015, 07/17/2017, 07/06/2018, 06/09/2019, 06/09/2019   Influenza-Unspecified 07/26/2016, 07/06/2018, 07/24/2020, 06/07/2021, 07/16/2022, 07/02/2023   PFIZER(Purple Top)SARS-COV-2 Vaccination 12/14/2019, 01/09/2020   Pfizer Covid-19 Vaccine Bivalent Booster 69yrs & up 06/07/2021   Tdap 07/12/2014   Health Maintenance  Topic Date Due   Colonoscopy  Never done   HIV Screening  07/23/2025 (Originally 01/20/1991)   DTaP/Tdap/Td (2 - Td or Tdap) 07/12/2024   Cervical Cancer Screening (HPV/Pap Cotest)  02/21/2025   INFLUENZA VACCINE  Completed   COVID-19 Vaccine  Completed   Hepatitis C Screening  Completed   HPV VACCINES  Aged Out   Chronic medical problems:   Insomnia:  Currently on Ambien CR 12.5 mg at bedtime prn.  She doesn's believe it is helping very much, and is only sleeping 2-3 hours per night for the past month or two.  She doesn't believe her sleeping difficulty is related to pain. She has not been told she is having symptoms of OSA.  Over the last couple weeks, she has also been taking promethazine.   She has also taken melatonin, trazodone, Seroquel, doxepin, and  other sleep medications but stopped do to ineffectiveness or side-effects.   Chronic pain:  Currently on tramadol 50 mg four times daily, Tizanidine 4 mg three times daily prn, and gabapentin 300 mg three times daily.  She follows with physical rehabilitation medicine every 6 months.   Review of Systems  Current Outpatient Medications on File Prior to Visit  Medication Sig Dispense Refill   fluticasone (FLONASE) 50 MCG/ACT nasal spray SPRAY 2 SPRAYS IN EACH NOSTRIL ONCE DAILY AS NEEDED 16 mL 0   gabapentin (NEURONTIN) 600 MG tablet TAKE 1/2 TABLET BY MOUTH THREE TIMES A DAY 45 tablet 2   hydrocortisone (ANUSOL-HC) 2.5 % rectal cream Place 1 Application rectally 2 (two) times daily. 30 g 2   levonorgestrel (MIRENA) 20 MCG/24HR IUD 1 each by Intrauterine route once.     meloxicam (MOBIC) 7.5 MG tablet TAKE 1 TABLET BY MOUTH DAILY 30 tablet 3   Multiple Vitamin (MULTIVITAMIN WITH MINERALS) TABS tablet Take 1 tablet by mouth daily.     rizatriptan (MAXALT-MLT) 5 MG disintegrating tablet Take 1 tablet (5 mg total) by mouth as needed. 10 tablet 0   tiZANidine (ZANAFLEX) 4 MG tablet Take 1 tablet (4 mg total) by mouth every 8 (eight) hours as needed for muscle spasms. 90 tablet 3   traMADol (ULTRAM) 50 MG tablet Take 1 tablet (50 mg total) by mouth 4 (four) times daily. 120 tablet 5   zolpidem (AMBIEN CR) 12.5 MG CR tablet Take 1 tablet (12.5 mg total) by mouth at bedtime as needed. for sleep 30 tablet 3   No current  facility-administered medications on file prior to visit.    Past Medical History:  Diagnosis Date   Allergy    Anxiety    Arthritis    Bipolar 1 disorder (HCC)    Chronic pain    back, hip - seeing PMR   Depression    Dysmenorrhea    Hypertension    Neuromuscular disorder (HCC)    Obesity     Past Surgical History:  Procedure Laterality Date   KIDNEY STONE SURGERY  09/21/2005   KNEE ARTHROSCOPY Right 09/21/2001   KNEE ARTHROSCOPY Right 09/21/2005   SACROILIAC JOINT  FUSION  01/20/2022   SHOULDER ARTHROSCOPY Right 09/21/2005   SPINE SURGERY  01/2022   Sacroiliac Joint Fusion   WISDOM TOOTH EXTRACTION  Highschool    Allergies  Allergen Reactions   Clindamycin/Lincomycin Nausea And Vomiting   Nubain [Nalbuphine Hcl] Other (See Comments)    *feels like something crawling on her*    Family History  Problem Relation Age of Onset   Addison's disease Mother    Diabetes Mother    Bipolar disorder Mother    Depression Mother    Early death Mother    Obesity Mother    Fibroids Maternal Aunt    Vision loss Maternal Grandmother    Depression Sister     Social History   Socioeconomic History   Marital status: Single    Spouse name: Not on file   Number of children: Not on file   Years of education: Not on file   Highest education level: Some college, no degree  Occupational History   Not on file  Tobacco Use   Smoking status: Former    Types: Cigarettes   Smokeless tobacco: Never  Vaping Use   Vaping status: Never Used  Substance and Sexual Activity   Alcohol use: No   Drug use: No   Sexual activity: Not Currently    Partners: Male    Birth control/protection: Abstinence, I.U.D.    Comment: Mirena IUD-06/09/23  Other Topics Concern   Not on file  Social History Narrative   Work or School: Pharmacist, hospital      Home Situation: roommate      Spiritual Beliefs:mormon      Lifestyle: no regular exercise; horrible diet            Social Drivers of Health   Financial Resource Strain: Patient Declined (11/08/2023)   Overall Financial Resource Strain (CARDIA)    Difficulty of Paying Living Expenses: Patient declined  Food Insecurity: Patient Declined (11/08/2023)   Hunger Vital Sign    Worried About Running Out of Food in the Last Year: Patient declined    Ran Out of Food in the Last Year: Patient declined  Transportation Needs: No Transportation Needs (11/08/2023)   PRAPARE - Administrator, Civil Service  (Medical): No    Lack of Transportation (Non-Medical): No  Physical Activity: Unknown (11/08/2023)   Exercise Vital Sign    Days of Exercise per Week: 0 days    Minutes of Exercise per Session: Not on file  Stress: Stress Concern Present (11/08/2023)   [employer] of Occupational Health - Occupational Stress Questionnaire    Feeling of Stress : Very much  Social Connections: Unknown (11/08/2023)   Social Connection and Isolation Panel [NHANES]    Frequency of Communication with Friends and Family: Once a week    Frequency of Social Gatherings with Friends and Family: Patient declined    Attends Religious  Services: Never    Active Member of Clubs or Organizations: No    Attends Engineer, structural: Not on file    Marital Status: Never married    There were no vitals filed for this visit. Body mass index is 38.11 kg/m.  Wt Readings from Last 3 Encounters:  10/15/23 229 lb (103.9 kg)  08/27/23 235 lb (106.6 kg)  08/04/23 230 lb 2 oz (104.4 kg)    Physical Exam Vitals and nursing note reviewed.  Constitutional:      General: She is not in acute distress.    Appearance: She is well-developed.  HENT:     Head: Normocephalic and atraumatic.     Right Ear: Hearing, tympanic membrane, ear canal and external ear normal.     Left Ear: Hearing, tympanic membrane, ear canal and external ear normal.     Mouth/Throat:     Mouth: Mucous membranes are moist.     Pharynx: Oropharynx is clear. Uvula midline.  Eyes:     Extraocular Movements: Extraocular movements intact.     Conjunctiva/sclera: Conjunctivae normal.     Pupils: Pupils are equal, round, and reactive to light.  Neck:     Thyroid: No thyroid mass or thyromegaly.     Trachea: No tracheal deviation.  Cardiovascular:     Rate and Rhythm: Normal rate and regular rhythm.     Pulses:          Dorsalis pedis pulses are 2+ on the right side and 2+ on the left side.     Heart sounds: No murmur heard. Pulmonary:      Effort: Pulmonary effort is normal. No respiratory distress.     Breath sounds: Normal breath sounds.  Abdominal:     Palpations: Abdomen is soft. There is no hepatomegaly or mass.     Tenderness: There is no abdominal tenderness.  Genitourinary:    Comments: Deferred to gyn. Musculoskeletal:     Right lower leg: No edema.     Left lower leg: No edema.     Comments: No major deformity or signs of synovitis appreciated.  Lymphadenopathy:     Cervical: No cervical adenopathy.     Upper Body:     Right upper body: No supraclavicular adenopathy.     Left upper body: No supraclavicular adenopathy.  Skin:    General: Skin is warm.     Findings: No erythema or rash.  Neurological:     General: No focal deficit present.     Mental Status: She is alert and oriented to person, place, and time.     Cranial Nerves: No cranial nerve deficit.     Sensory: No sensory deficit.     Motor: No weakness.     Coordination: Coordination normal.     Gait: Gait normal.     Deep Tendon Reflexes:     Reflex Scores:      Bicep reflexes are 2+ on the right side and 2+ on the left side.      Patellar reflexes are 2+ on the right side and 2+ on the left side. Psychiatric:     Comments: Well groomed, good eye contact.    ASSESSMENT AND PLAN:  Ms. Maria Powers was here today for her annual physical examination.  No orders of the defined types were placed in this encounter.   @ASSESSPLAN @  There are no diagnoses linked to this encounter.  No follow-ups on file.  Trula Ore, acting as a Neurosurgeon  for Patrik Turnbaugh [place], MD., have documented all relevant documentation on the behalf of Sostenes Kauffmann [place], MD, as directed by  Zita Ozimek [place], MD while in the presence of Usman Millett [place], MD.   I, Damauri Minion [place], MD, have reviewed all documentation for this visit. The documentation on 11/08/23 for the exam, diagnosis, procedures, and orders are all accurate and complete.  Avyay Coger G. [place], MD  [facility]. [place] office.

## 2023-11-09 NOTE — Assessment & Plan Note (Addendum)
 We discussed the importance of regular physical activity and healthy diet for prevention of chronic illness and/or complications. Preventive guidelines reviewed. Vaccination up to date. Continue her female preventive care with her gyn, she needs to re-schedule appt. She is not fasting today, so we will be back tomorrow for fasting labs. Next CPE in a year.

## 2023-11-09 NOTE — Assessment & Plan Note (Signed)
 Consistency with a healthy lifestyle encouraged for diabetes prevention. Last hemoglobin A1c was 5.9 in 10/2022.

## 2023-11-09 NOTE — Assessment & Plan Note (Signed)
 Problem is not well controlled, Ambien CR 12.5 mg is not longer helping. She ahs tried multiple medications in the past, either did not tolerate well or did not help. This could be related to psychiatric disorder, recommend establishing with psychiatrist. She agrees with adding Doxepin 25 mg daily. No changes in Ambien CT dose. Stressed the importance of a good sleep hygiene. She will let me know psychiatrist services covered by her health insurance.

## 2023-11-09 NOTE — Assessment & Plan Note (Signed)
 Non pharmacologic treatment recommended for now. Further recommendations will be given according to 10 years CVD risk score and lipid panel numbers.

## 2023-11-10 ENCOUNTER — Other Ambulatory Visit: Payer: Managed Care, Other (non HMO)

## 2023-11-16 ENCOUNTER — Other Ambulatory Visit (INDEPENDENT_AMBULATORY_CARE_PROVIDER_SITE_OTHER): Payer: Managed Care, Other (non HMO)

## 2023-11-16 ENCOUNTER — Encounter: Payer: Self-pay | Admitting: Family Medicine

## 2023-11-16 DIAGNOSIS — R7303 Prediabetes: Secondary | ICD-10-CM

## 2023-11-16 DIAGNOSIS — G4700 Insomnia, unspecified: Secondary | ICD-10-CM | POA: Diagnosis not present

## 2023-11-16 DIAGNOSIS — E785 Hyperlipidemia, unspecified: Secondary | ICD-10-CM | POA: Diagnosis not present

## 2023-11-16 LAB — LIPID PANEL
Cholesterol: 182 mg/dL (ref 0–200)
HDL: 51.3 mg/dL (ref >39.00)
LDL Cholesterol: 113 mg/dL — ABNORMAL HIGH (ref 0–99)
NonHDL: 130.98
Total CHOL/HDL Ratio: 4
Triglycerides: 89 mg/dL (ref 0.0–149.0)
VLDL: 17.8 mg/dL (ref 0.0–40.0)

## 2023-11-16 LAB — COMPREHENSIVE METABOLIC PANEL
ALT: 16 U/L (ref 0–35)
AST: 17 U/L (ref 0–37)
Albumin: 4 g/dL (ref 3.5–5.2)
Alkaline Phosphatase: 41 U/L (ref 39–117)
BUN: 22 mg/dL (ref 6–23)
CO2: 26 meq/L (ref 19–32)
Calcium: 9 mg/dL (ref 8.4–10.5)
Chloride: 105 meq/L (ref 96–112)
Creatinine, Ser: 0.86 mg/dL (ref 0.40–1.20)
GFR: 80.79 mL/min (ref >60.00)
Glucose, Bld: 100 mg/dL — ABNORMAL HIGH (ref 70–99)
Potassium: 4.1 meq/L (ref 3.5–5.1)
Sodium: 139 meq/L (ref 135–145)
Total Bilirubin: 0.6 mg/dL (ref 0.2–1.2)
Total Protein: 6.7 g/dL (ref 6.0–8.3)

## 2023-11-16 LAB — HEMOGLOBIN A1C: Hgb A1c MFr Bld: 6 % (ref 4.6–6.5)

## 2023-11-16 LAB — TSH: TSH: 1 u[IU]/mL (ref 0.35–5.50)

## 2023-11-17 ENCOUNTER — Encounter: Payer: Self-pay | Admitting: Family Medicine

## 2023-11-18 ENCOUNTER — Telehealth: Payer: Self-pay | Admitting: Family Medicine

## 2023-11-18 NOTE — Telephone Encounter (Signed)
 Patient dropped off document  Wellness Screening , to be filled out by provider. Patient requested to send it back via Call Patient to pick up within 7-days. Document is located in providers tray at front office.Please advise at [facility] [phone]

## 2023-11-19 ENCOUNTER — Encounter: Payer: Self-pay | Admitting: Physical Medicine & Rehabilitation

## 2023-11-19 ENCOUNTER — Other Ambulatory Visit: Payer: Self-pay | Admitting: Physical Medicine & Rehabilitation

## 2023-11-19 ENCOUNTER — Encounter: Payer: Self-pay | Admitting: Family Medicine

## 2023-11-19 DIAGNOSIS — Z0279 Encounter for issue of other medical certificate: Secondary | ICD-10-CM

## 2023-11-19 NOTE — Telephone Encounter (Signed)
 In your bin to be signed.

## 2023-11-19 NOTE — Telephone Encounter (Signed)
Done. Thanks, BJ 

## 2023-11-19 NOTE — Telephone Encounter (Signed)
 I called and spoke with patient. Advised that form is ready for pick up. Pt is requesting a letter for her job to be able to keep her water with her. Advised I would complete the letter and have it sent to her via mychart as she has had a recent visit & exam.

## 2023-11-22 MED ORDER — GABAPENTIN 400 MG PO CAPS: 400.0000 mg | ORAL_CAPSULE | Freq: Three times a day (TID) | ORAL | 2 refills | Status: DC

## 2023-11-23 ENCOUNTER — Telehealth: Payer: Self-pay

## 2023-11-23 NOTE — Telephone Encounter (Signed)
 Copied from CRM [id number]. Topic: Clinical - Medication Question >> Nov 23, 2023  4:54 PM Corin V wrote: Reason for CRM: Patient called in as she has not heard back about her MyChart message from 11/17/23. She stated " The doxepin 25mg  did not seem to have any effect on my sleep until I took two of them. Would it be possible to have the doxepin increased to 2 or 50mg  at bedtime?" Call disconnect and patient did not answer when agent attempted to call back.

## 2023-11-24 ENCOUNTER — Other Ambulatory Visit: Payer: Self-pay | Admitting: Family Medicine

## 2023-11-24 MED ORDER — DOXEPIN HCL 50 MG PO CAPS: 50.0000 mg | ORAL_CAPSULE | Freq: Every evening | ORAL | 1 refills | Status: DC | PRN

## 2023-11-24 NOTE — Telephone Encounter (Signed)
 See mychart encounter. PCP sent new Rx.

## 2023-11-25 ENCOUNTER — Other Ambulatory Visit: Payer: Self-pay | Admitting: Physical Medicine & Rehabilitation

## 2023-12-02 ENCOUNTER — Other Ambulatory Visit: Payer: Self-pay | Admitting: Family Medicine

## 2023-12-02 DIAGNOSIS — G4700 Insomnia, unspecified: Secondary | ICD-10-CM

## 2023-12-12 ENCOUNTER — Ambulatory Visit (HOSPITAL_COMMUNITY)
Admission: EM | Admit: 2023-12-12 | Discharge: 2023-12-12 | Disposition: A | Discharge status: Home / Self Care | Attending: Emergency Medicine | Admitting: Emergency Medicine

## 2023-12-12 ENCOUNTER — Encounter (HOSPITAL_COMMUNITY): Payer: Self-pay | Admitting: Emergency Medicine

## 2023-12-12 ENCOUNTER — Other Ambulatory Visit: Payer: Self-pay

## 2023-12-12 DIAGNOSIS — M5442 Lumbago with sciatica, left side: Secondary | ICD-10-CM | POA: Diagnosis not present

## 2023-12-12 DIAGNOSIS — M5441 Lumbago with sciatica, right side: Secondary | ICD-10-CM

## 2023-12-12 DIAGNOSIS — G8929 Other chronic pain: Secondary | ICD-10-CM

## 2023-12-12 DIAGNOSIS — Z3202 Encounter for pregnancy test, result negative: Secondary | ICD-10-CM

## 2023-12-12 LAB — POCT URINE PREGNANCY: Preg Test, Ur: NEGATIVE

## 2023-12-12 MED ORDER — KETOROLAC TROMETHAMINE 10 MG PO TABS: 10.0000 mg | ORAL_TABLET | Freq: Four times a day (QID) | ORAL | 0 refills | Status: DC | PRN

## 2023-12-12 MED ORDER — KETOROLAC TROMETHAMINE 30 MG/ML IJ SOLN
60.0000 mg | Freq: Once | INTRAMUSCULAR | Status: AC
  Administered 2023-12-12: 60 mg via INTRAMUSCULAR

## 2023-12-12 MED ORDER — KETOROLAC TROMETHAMINE 60 MG/2ML IM SOLN
INTRAMUSCULAR | Status: AC
  Filled 2023-12-12: qty 2

## 2023-12-12 NOTE — Discharge Instructions (Signed)
 The Toradol injection given today should start to work in about 30 minutes. Please do not use any NSAIDs (ibuprofen/Advil, naproxen/Aleve, etc) for the next 12 hours. You can safely use tylenol.   Pause your meloxicam if you choose to take the oral ketorolac pills  Call your back specialist for follow up

## 2023-12-12 NOTE — ED Provider Notes (Signed)
 [facility]    CSN: [id number] Arrival date & time: 12/12/23  1611      History   Chief Complaint Chief Complaint  Patient presents with   Back Pain    HPI Maria Powers is a 48 y.o. female.  2-week history of low back pain.  She does have history of chronic pain and follows with pain medicine.  Has been using all of her medicines as directed including tizanidine, tramadol, gabapentin, meloxicam.  She has received Toradol injections in the past and is requesting one today as they usually are helpful.  She is not currently having any weakness or paresthesias of the extremities. No bladder or bowel dysfunction  LMP unknown, IUD  Past Medical History:  Diagnosis Date   Allergy    Anxiety    Arthritis    Bipolar 1 disorder (HCC)    Chronic pain    back, hip - seeing PMR   Depression    Dysmenorrhea    Hypertension    Neuromuscular disorder (HCC)    Obesity     Patient Active Problem List   Diagnosis Date Noted   Hyperlipidemia 11/08/2023   Routine general medical examination at a health care facility 11/03/2022   Insomnia 02/20/2022   Prediabetes 09/10/2020   Class 2 obesity with body mass index (BMI) of 39.0 to 39.9 in adult 10/16/2015   Patellofemoral arthritis of right knee 09/02/2015   Sacroiliac dysfunction 08/14/2014   Muscle pain, myofascial 08/14/2014   Essential hypertension, benign 04/30/2014   Menorrhagia 04/18/2014   Dysmenorrhea 04/18/2014   Migraine headache 07/10/2013   Low back pain radiating to right leg 05/18/2012    Past Surgical History:  Procedure Laterality Date   KIDNEY STONE SURGERY  09/21/2005   KNEE ARTHROSCOPY Right 09/21/2001   KNEE ARTHROSCOPY Right 09/21/2005   SACROILIAC JOINT FUSION  01/20/2022   SHOULDER ARTHROSCOPY Right 09/21/2005   SPINE SURGERY  01/2022   Sacroiliac Joint Fusion   WISDOM TOOTH EXTRACTION  Highschool    OB History     Gravida  1   Para  1   Term  1   Preterm      AB      Living   1      SAB      IAB      Ectopic      Multiple      Live Births  1            Home Medications    Prior to Admission medications   Medication Sig Start Date End Date Taking? Authorizing Provider  doxepin (SINEQUAN) 50 MG capsule Take 1 capsule (50 mg total) by mouth at bedtime as needed. 11/24/23   [place], Betty G, MD  ketorolac (TORADOL) 10 MG tablet Take 1 tablet (10 mg total) by mouth every 6 (six) hours as needed for moderate pain (pain score 4-6) or severe pain (pain score 7-10). 12/12/23  Yes Elnita Surprenant, PA-C  fluticasone (FLONASE) 50 MCG/ACT nasal spray SPRAY 2 SPRAYS IN EACH NOSTRIL ONCE DAILY AS NEEDED 06/09/23   [place], Betty G, MD  gabapentin (NEURONTIN) 400 MG capsule Take 1 capsule (400 mg total) by mouth 3 (three) times daily. 11/22/23   Kirsteins, Victorino Sparrow, MD  hydrocortisone (ANUSOL-HC) 2.5 % rectal cream Place 1 Application rectally 2 (two) times daily. 10/08/22   Philip Aspen, Limmie Patricia, MD  levonorgestrel (MIRENA) 20 MCG/24HR IUD 1 each by Intrauterine route once.    [provider]  meloxicam (MOBIC) 7.5 MG tablet TAKE 1 TABLET BY MOUTH DAILY 09/09/23   Kirsteins, Victorino Sparrow, MD  Multiple Vitamin (MULTIVITAMIN WITH MINERALS) TABS tablet Take 1 tablet by mouth daily.    [provider]  rizatriptan (MAXALT-MLT) 5 MG disintegrating tablet Take 1 tablet (5 mg total) by mouth as needed. 07/05/18   Terressa Koyanagi, DO  tiZANidine (ZANAFLEX) 4 MG tablet Take 1 tablet (4 mg total) by mouth every 8 (eight) hours as needed for muscle spasms. 08/27/23   Kirsteins, Victorino Sparrow, MD  traMADol (ULTRAM) 50 MG tablet Take 1 tablet (50 mg total) by mouth 4 (four) times daily. 07/13/23   Kirsteins, Victorino Sparrow, MD  zolpidem (AMBIEN CR) 12.5 MG CR tablet TAKE 1 TABLET BY MOUTH EVERY NIGHT AT BEDTIME AS NEEDED FOR SLEEP 12/06/23   [place], Betty G, MD    Family History Family History  Problem Relation Age of Onset   Addison's disease Mother    Diabetes Mother     Bipolar disorder Mother    Depression Mother    Early death Mother    Obesity Mother    Fibroids Maternal Aunt    Vision loss Maternal Grandmother    Depression Sister     Social History Social History   Tobacco Use   Smoking status: Former    Types: Cigarettes   Smokeless tobacco: Never  Vaping Use   Vaping status: Never Used  Substance Use Topics   Alcohol use: No   Drug use: No     Allergies   Clindamycin/lincomycin and Nubain [nalbuphine hcl]   Review of Systems Review of Systems  Musculoskeletal:  Positive for back pain.   Per HPI  Physical Exam Triage Vital Signs ED Triage Vitals  Encounter Vitals Group     BP --      Systolic BP Percentile --      Diastolic BP Percentile --      Pulse Rate 12/12/23 1642 84     Resp 12/12/23 1642 18     Temp 12/12/23 1642 98.3 F (36.8 C)     Temp Source 12/12/23 1642 Oral     SpO2 12/12/23 1642 99 %     Weight --      Height --      Head Circumference --      Peak Flow --      Pain Score 12/12/23 1643 8     Pain Loc --      Pain Education --      Exclude from Growth Chart --    No data found.  Updated Vital Signs Pulse 84   Temp 98.3 F (36.8 C) (Oral)   Resp 18   SpO2 99%   Physical Exam Vitals and nursing note reviewed.  Constitutional:      General: She is not in acute distress. HENT:     Mouth/Throat:     Mouth: Mucous membranes are moist.     Pharynx: Oropharynx is clear.  Eyes:     Extraocular Movements: Extraocular movements intact.     Conjunctiva/sclera: Conjunctivae normal.     Pupils: Pupils are equal, round, and reactive to light.  Cardiovascular:     Rate and Rhythm: Normal rate and regular rhythm.     Heart sounds: Normal heart sounds.  Pulmonary:     Effort: Pulmonary effort is normal.     Breath sounds: Normal breath sounds.  Musculoskeletal:        General: Normal range of motion.  Cervical back: Normal range of motion. No rigidity or tenderness.     Comments: Low back  paraspinal tenderness with palpation.  No bony tenderness.  No bruising, swelling, deformity noted  Skin:    General: Skin is warm and dry.  Neurological:     General: No focal deficit present.     Mental Status: She is alert and oriented to person, place, and time.     Cranial Nerves: Cranial nerves 2-12 are intact. No cranial nerve deficit.     Sensory: Sensation is intact.     Motor: Motor function is intact. No weakness.     Coordination: Coordination is intact.     Gait: Gait is intact.     Deep Tendon Reflexes: Reflexes are normal and symmetric.     Comments: Strength 5/5. Sensation intact throughout      UC Treatments / Results  Labs (all labs ordered are listed, but only abnormal results are displayed) Labs Reviewed  POCT URINE PREGNANCY    EKG  Radiology No results found.  Procedures Procedures (including critical care time)  Medications Ordered in UC Medications  ketorolac (TORADOL) 30 MG/ML injection 60 mg (60 mg Intramuscular Given 12/12/23 1749)    Initial Impression / Assessment and Plan / UC Course  I have reviewed the triage vital signs and the nursing notes.  Pertinent labs & imaging results that were available during my care of the patient were reviewed by me and considered in my medical decision making (see chart for details).  UPT negative  Stable vitals, neurologically intact, paraspinal tenderness on exam.  No indication for imaging today.  Chronic back pain with acute flare.  Patient requesting Toradol IM which is given.  She also does well with the oral Toradol pills.  Discussed discontinuing her meloxicam if she does decide to take the ketorolac.  She will contact her pain management specialist for further evaluation.  Return and ED precautions discussed.  Final Clinical Impressions(s) / UC Diagnoses   Final diagnoses:  Chronic bilateral low back pain with bilateral sciatica     Discharge Instructions      The Toradol injection given today  should start to work in about 30 minutes. Please do not use any NSAIDs (ibuprofen/Advil, naproxen/Aleve, etc) for the next 12 hours. You can safely use tylenol.   Pause your meloxicam if you choose to take the oral ketorolac pills  Call your back specialist for follow up    ED Prescriptions     Medication Sig Dispense Auth. Provider   ketorolac (TORADOL) 10 MG tablet Take 1 tablet (10 mg total) by mouth every 6 (six) hours as needed for moderate pain (pain score 4-6) or severe pain (pain score 7-10). 15 tablet Jeanne Diefendorf, Lurena Joiner, PA-C      PDMP not reviewed this encounter.   Marlow Baars, [place] 12/12/23 1807

## 2023-12-12 NOTE — ED Triage Notes (Signed)
 Pt c/o lower back pain for the past 2 weeks. States her pain medication is not helping and is getting even difficult to walk. Pt states "I just need a shot of Toradol."

## 2023-12-13 ENCOUNTER — Encounter: Payer: Self-pay | Admitting: Physical Medicine & Rehabilitation

## 2023-12-13 MED ORDER — METAXALONE 800 MG PO TABS: 400.0000 mg | ORAL_TABLET | Freq: Three times a day (TID) | ORAL | 2 refills | Status: DC

## 2023-12-14 ENCOUNTER — Other Ambulatory Visit: Payer: Self-pay | Admitting: Physical Medicine & Rehabilitation

## 2023-12-17 ENCOUNTER — Other Ambulatory Visit: Payer: Self-pay | Admitting: Physical Medicine & Rehabilitation

## 2023-12-17 MED ORDER — CYCLOBENZAPRINE HCL 5 MG PO TABS
5.0000 mg | ORAL_TABLET | Freq: Three times a day (TID) | ORAL | 0 refills | Status: DC | PRN
Start: 2023-12-17 — End: 2023-12-30

## 2023-12-19 ENCOUNTER — Other Ambulatory Visit: Payer: Self-pay | Admitting: Physical Medicine & Rehabilitation

## 2023-12-30 ENCOUNTER — Encounter: Attending: Physical Medicine & Rehabilitation | Admitting: Physical Medicine & Rehabilitation

## 2023-12-30 ENCOUNTER — Encounter: Payer: Self-pay | Admitting: Physical Medicine & Rehabilitation

## 2023-12-30 VITALS — BP 148/85 | HR 87 | Ht 65.0 in | Wt 233.0 lb

## 2023-12-30 DIAGNOSIS — G894 Chronic pain syndrome: Secondary | ICD-10-CM | POA: Insufficient documentation

## 2023-12-30 DIAGNOSIS — Z5181 Encounter for therapeutic drug level monitoring: Secondary | ICD-10-CM | POA: Diagnosis not present

## 2023-12-30 DIAGNOSIS — M7918 Myalgia, other site: Secondary | ICD-10-CM | POA: Diagnosis present

## 2023-12-30 DIAGNOSIS — M5416 Radiculopathy, lumbar region: Secondary | ICD-10-CM | POA: Insufficient documentation

## 2023-12-30 DIAGNOSIS — Z79891 Long term (current) use of opiate analgesic: Secondary | ICD-10-CM | POA: Insufficient documentation

## 2023-12-30 MED ORDER — LIDOCAINE HCL 1 % IJ SOLN: 5.0000 mL | Freq: Once | INTRAMUSCULAR | Status: AC

## 2023-12-30 MED ORDER — CYCLOBENZAPRINE HCL 10 MG PO TABS: 5.0000 mg | ORAL_TABLET | Freq: Three times a day (TID) | ORAL | 1 refills | Status: DC | PRN

## 2023-12-30 MED ORDER — MELOXICAM 15 MG PO TABS: 15.0000 mg | ORAL_TABLET | Freq: Every day | ORAL | 1 refills | Status: DC

## 2023-12-30 NOTE — Progress Notes (Signed)
 Subjective:    Patient ID: Maria Powers, female    DOB: April 29, 1976, 48 y.o.   MRN: [id number]  HPI CC:  Right post hip and buttock pain 48 year old female with chronic low back and buttock pain is here for exacerbation of pain.  She has had some change in the location of her pain and characteristics.  She has had no discrete injury.  She did go to urgent care for this. X-ray and prior CT of the sacroiliac joint was reviewed today both reports and actual images Pain is fairly constant but worsens with forward   No numbness and tingling in thigh or foot but has an ice cold burn which is partially relieved by Gabapentin Saw urgent care and tried toradol shot which did not help  Used to take tizanidine but could not tolerate drowsiness  Hx of right SI fusion ~2 years ago, the deep pain that she experienced prior to procedure has abated and has not returned    Pain Inventory Average Pain 5 Pain Right Now 5 My pain is constant, sharp, burning, stabbing, and aching  In the last 24 hours, has pain interfered with the following? General activity 6 Relation with others 6 Enjoyment of life 6 What TIME of day is your pain at its worst? morning , daytime, evening, and night Sleep (in general) Fair  Pain is worse with: bending Pain improves with: medication Relief from Meds: 4  Family History  Problem Relation Age of Onset   Addison's disease Mother    Diabetes Mother    Bipolar disorder Mother    Depression Mother    Early death Mother    Obesity Mother    Fibroids Maternal Aunt    Vision loss Maternal Grandmother    Depression Sister    Social History   Socioeconomic History   Marital status: Single    Spouse name: Not on file   Number of children: Not on file   Years of education: Not on file   Highest education level: Some college, no degree  Occupational History   Not on file  Tobacco Use   Smoking status: Former    Types: Cigarettes   Smokeless tobacco: Never   Vaping Use   Vaping status: Never Used  Substance and Sexual Activity   Alcohol use: No   Drug use: No   Sexual activity: Not Currently    Partners: Male    Birth control/protection: Abstinence, I.U.D.    Comment: Mirena IUD-06/09/23  Other Topics Concern   Not on file  Social History Narrative   Work or School: Pharmacist, hospital      Home Situation: roommate      Spiritual Beliefs:mormon      Lifestyle: no regular exercise; horrible diet            Social Drivers of Health   Financial Resource Strain: Patient Declined (11/08/2023)   Overall Financial Resource Strain (CARDIA)    Difficulty of Paying Living Expenses: Patient declined  Food Insecurity: Patient Declined (11/08/2023)   Hunger Vital Sign    Worried About Running Out of Food in the Last Year: Patient declined    Ran Out of Food in the Last Year: Patient declined  Transportation Needs: No Transportation Needs (11/08/2023)   PRAPARE - Administrator, Civil Service (Medical): No    Lack of Transportation (Non-Medical): No  Physical Activity: Unknown (11/08/2023)   Exercise Vital Sign    Days of Exercise per Week: 0  days    Minutes of Exercise per Session: Not on file  Stress: Stress Concern Present (11/08/2023)   [employer] of Occupational Health - Occupational Stress Questionnaire    Feeling of Stress : Very much  Social Connections: Unknown (11/08/2023)   Social Connection and Isolation Panel [NHANES]    Frequency of Communication with Friends and Family: Once a week    Frequency of Social Gatherings with Friends and Family: Patient declined    Attends Religious Services: Never    Database administrator or Organizations: No    Attends Engineer, structural: Not on file    Marital Status: Never married   Past Surgical History:  Procedure Laterality Date   KIDNEY STONE SURGERY  09/21/2005   KNEE ARTHROSCOPY Right 09/21/2001   KNEE ARTHROSCOPY Right 09/21/2005    SACROILIAC JOINT FUSION  01/20/2022   SHOULDER ARTHROSCOPY Right 09/21/2005   SPINE SURGERY  01/2022   Sacroiliac Joint Fusion   WISDOM TOOTH EXTRACTION  Highschool   Past Surgical History:  Procedure Laterality Date   KIDNEY STONE SURGERY  09/21/2005   KNEE ARTHROSCOPY Right 09/21/2001   KNEE ARTHROSCOPY Right 09/21/2005   SACROILIAC JOINT FUSION  01/20/2022   SHOULDER ARTHROSCOPY Right 09/21/2005   SPINE SURGERY  01/2022   Sacroiliac Joint Fusion   WISDOM TOOTH EXTRACTION  Highschool   Past Medical History:  Diagnosis Date   Allergy    Anxiety    Arthritis    Bipolar 1 disorder (HCC)    Chronic pain    back, hip - seeing PMR   Depression    Dysmenorrhea    Hypertension    Neuromuscular disorder (HCC)    Obesity    Ht 5\' 5"  (1.651 m)   Wt 233 lb (105.7 kg)   BMI 38.77 kg/m   Opioid Risk Score:   Fall Risk Score:  `1  Depression screen [facility] 2/9     12/30/2023    9:20 AM 10/15/2023   10:35 AM 08/27/2023    9:16 AM 07/13/2023   10:25 AM 11/26/2022   10:27 AM 11/03/2022    9:12 AM 03/04/2022    3:30 PM  Depression screen PHQ 2/9  Decreased Interest 0 0 0 0 0 1 0  Down, Depressed, Hopeless 0 0 0 0 0 1 0  PHQ - 2 Score 0 0 0 0 0 2 0  Altered sleeping      3 3  Tired, decreased energy      2 1  Change in appetite      0 0  Feeling bad or failure about yourself       1 0  Trouble concentrating      1 0  Moving slowly or fidgety/restless      0 0  Suicidal thoughts      1 0  PHQ-9 Score      10 4  Difficult doing work/chores      Somewhat difficult Somewhat difficult    Review of Systems  Musculoskeletal:  Positive for back pain.  All other systems reviewed and are negative.      Objective:   Physical Exam General No acute distress Mood affect appropriate Extremities without edema Positive straight leg raise on the right side shooting pain into the back and buttock area Sensation normal bilateral lower limbs Deep tendon reflexes are absent at the knees and  ankles bilaterally Motor strength is normal bilateral lower extremities Ambulates without assistive device no evidence of toe  drag or knee stability Lumbar spine has tenderness along the lumbar paraspinals L4-S1 there is also tenderness at the right PSIS  Sacral thrust (prone) : Positive Lateral compression: Negative FABER's: No groin pain, positive SI pain right side Distraction (supine): Positive right Thigh thrust test: Negative        Assessment & Plan:  1.  Right lumbar radiculitis, imaging studies demonstrate decreased disc space L5-S1 I suspect the straight leg raise is positive because of herniated nucleus pulposis impinging upon the L5 nerve root however patient has multiple potential pain generators lumbar MRI would be helpful this has been going on for several months.  This has not responded to dosage escalations of her gabapentin.  Will increase the meloxicam to 15 mg I will see her back after the lumbar MRI 2.  Myofascial pain lumbar area this may be a secondary phenomenon but certainly may be contributing.  Would recommend trigger point injections Trigger Point Injection  Indication: right lumbar Myofascial pain not relieved by medication management and other conservative care.  Informed consent was obtained after describing risk and benefits of the procedure with the patient, this includes bleeding, bruising, infection and medication side effects.  The patient wishes to proceed and has given written consent.  The patient was placed in a standing forward flexed position.  The RIght L4,5,S1 paraspinal area was marked and prepped with Betadine.  It was entered with a 25-gauge 1-1/2 inch needle and 1 mL of 1% lidocaine was injected into each of 3  trigger points, after negative draw back for blood.  The patient tolerated the procedure well.  Post procedure instructions were given.

## 2024-01-02 ENCOUNTER — Encounter: Payer: Self-pay | Admitting: Family Medicine

## 2024-01-03 ENCOUNTER — Other Ambulatory Visit: Payer: Self-pay | Admitting: Physical Medicine & Rehabilitation

## 2024-01-03 ENCOUNTER — Other Ambulatory Visit: Payer: Self-pay | Admitting: Family Medicine

## 2024-01-04 ENCOUNTER — Other Ambulatory Visit: Payer: Self-pay | Admitting: Family Medicine

## 2024-01-04 LAB — TOXASSURE SELECT,+ANTIDEPR,UR

## 2024-01-05 ENCOUNTER — Encounter: Payer: Self-pay | Admitting: Physical Medicine & Rehabilitation

## 2024-01-06 ENCOUNTER — Ambulatory Visit (HOSPITAL_BASED_OUTPATIENT_CLINIC_OR_DEPARTMENT_OTHER)

## 2024-01-06 MED ORDER — LIDOCAINE 5 % EX PTCH: 1.0000 | MEDICATED_PATCH | CUTANEOUS | 2 refills | Status: DC

## 2024-01-08 ENCOUNTER — Ambulatory Visit
Admission: RE | Admit: 2024-01-08 | Discharge: 2024-01-08 | Disposition: A | Discharge status: Home / Self Care | Source: Ambulatory Visit | Attending: Physical Medicine & Rehabilitation | Admitting: Physical Medicine & Rehabilitation

## 2024-01-08 DIAGNOSIS — M5416 Radiculopathy, lumbar region: Secondary | ICD-10-CM

## 2024-01-11 ENCOUNTER — Other Ambulatory Visit: Payer: Self-pay | Admitting: Family Medicine

## 2024-01-11 MED ORDER — KETOCONAZOLE 2 % EX CREA: 1.0000 | TOPICAL_CREAM | Freq: Two times a day (BID) | CUTANEOUS | 0 refills | Status: AC | PRN

## 2024-01-12 ENCOUNTER — Ambulatory Visit: Payer: Managed Care, Other (non HMO) | Admitting: Registered Nurse

## 2024-01-14 ENCOUNTER — Other Ambulatory Visit: Payer: Self-pay | Admitting: Physical Medicine & Rehabilitation

## 2024-01-20 ENCOUNTER — Encounter: Payer: Self-pay | Admitting: Physical Medicine & Rehabilitation

## 2024-01-20 ENCOUNTER — Encounter: Attending: Physical Medicine & Rehabilitation | Admitting: Physical Medicine & Rehabilitation

## 2024-01-20 ENCOUNTER — Encounter: Admitting: Physical Medicine & Rehabilitation

## 2024-01-20 VITALS — BP 161/99 | HR 81 | Ht 65.0 in | Wt 232.0 lb

## 2024-01-20 DIAGNOSIS — G8929 Other chronic pain: Secondary | ICD-10-CM | POA: Diagnosis present

## 2024-01-20 DIAGNOSIS — M545 Low back pain, unspecified: Secondary | ICD-10-CM | POA: Diagnosis present

## 2024-01-20 DIAGNOSIS — M79604 Pain in right leg: Secondary | ICD-10-CM | POA: Diagnosis present

## 2024-01-20 DIAGNOSIS — M533 Sacrococcygeal disorders, not elsewhere classified: Secondary | ICD-10-CM | POA: Insufficient documentation

## 2024-01-20 MED ORDER — LIDOCAINE 5 % EX PTCH: 1.0000 | MEDICATED_PATCH | CUTANEOUS | 0 refills | Status: DC

## 2024-01-20 MED ORDER — CELECOXIB 100 MG PO CAPS
100.0000 mg | ORAL_CAPSULE | Freq: Two times a day (BID) | ORAL | 2 refills | Status: DC
Start: 2024-01-20 — End: 2024-04-19

## 2024-01-20 NOTE — Patient Instructions (Signed)
 Please call Dr Jackee Marus office to get follow up appt to check your painful area  Would recommend aquatic exercise   Trying Celecoxib  instead of meloxicam   We are increasing lidoderm  patch to 2 at a time

## 2024-01-20 NOTE — Progress Notes (Signed)
 Subjective:    Patient ID: Maria Powers, female    DOB: [birth year], 48 y.o.   MRN: [id number]  HPI  48 year old female with chronic right sacroiliac pain who has undergone right sacroiliac fusion by Dr. Jackee Marus in 2023.  Over the last several months she has had increasing pain in the right lower extremity.  She states that this pain is in the thigh going down to the foot.  She has no persistent numbness or tingling.  She has no progressive weakness.  No bowel or bladder dysfunction.  She has had no recent fevers or weight loss. She remains independent with all self-care and mobility.  She is able to work She does not feel the meloxicam  is working as well as it used to.  We discussed other alternatives such as celecoxib .  She does not feel the muscle relaxer is doing much good. Her lower extremity pain is not the main issue and we discussed that raising the gabapentin  would likely not have much effect.   Wants to try different NSAID  Cyclobenzaprine   MR LUMBAR SPINE WITHOUT IV CONTRAST  COMPARISON: MRI lumbar spine report from 10/23/2021  CLINICAL HISTORY: Lumbar radiculopathy  TECHNIQUE: SAG T2, SAG T1, SAG STIR, AX T2, AX T1 without IV contrast.  FINDINGS: There is normal alignment of the lumbar spine. Mild disc desiccation. There is no vertebral body height loss, subluxation or marrow replacing process. Right sacroiliac screws are identified. Otherwise, the visualized sacrum and SI joints are unremarkable. Conus and cauda equina are unremarkable.  T12-L1: There is no focal disc protrusion, foraminal or spinal stenosis.  L1-2: There is no focal disc protrusion, foraminal or spinal stenosis.  L2-3: There is a minimal broad-based disc osteophyte with small right-sided disc osteophyte slightly abutting the exiting right L4 nerve. Correlation for mild right L2 radiculopathy. This is best seen on sagittal image 6 and 7 and axial image 16 of series 113. No significant spinal  stenosis or left foraminal narrowing is present. Mild facet arthrosis.  L3-4: Minimal broad-based disc osteophyte without significant foraminal or spinal stenosis. Mild facet arthrosis.  L4-5: Mild broad-based disc osteophyte and mild facet arthrosis. There is a small left paracentral protrusion abutting the descending left L5 nerve root in the left lateral recess. This is best seen on sagittal image 11 and axial image 31 and 32. Mild facet arthrosis is present. No significant foraminal narrowing is appreciated.  L5-S1: Mild disc desiccation and mild facet arthrosis. No significant foraminal or spinal stenosis.  The retroperitoneal structures demonstrate no significant abnormality.  IMPRESSION: Mild degenerative spondylosis with disc desiccation and mild facet arthrosis as above. Small right foraminal protrusion at L2-3 slightly abutting the exiting right L2 nerve. No definite impingement is seen.  Small left paracentral bulge at L4-5 abutting the descending left L5 nerve root in the lateral recess.  No high-grade spinal or foraminal stenosis. No significant interval change when compared with the prior report.  Electronically signed by: Adrien Alberta MD 01/17/2024 12:32 PM EDT RP Workstation: [id number]  Pain Inventory Average Pain 7 Pain Right Now 5 My pain is constant, sharp, burning, dull, stabbing, tingling, and aching  In the last 24 hours, has pain interfered with the following? General activity 7 Relation with others 0 Enjoyment of life 0 What TIME of day is your pain at its worst? night and varies Sleep (in general) Fair  Pain is worse with: walking, bending, sitting, standing, and some activites Pain improves with: medication and injections Relief from Meds: 4  Family History  Problem Relation Age of Onset   Addison's disease Mother    Diabetes Mother    Bipolar disorder Mother    Depression Mother    Early death Mother    Obesity Mother    Depression  Sister    Vision loss Maternal Grandmother    Fibroids Maternal Aunt    Social History   Socioeconomic History   Marital status: Single    Spouse name: Not on file   Number of children: Not on file   Years of education: Not on file   Highest education level: Some college, no degree  Occupational History   Not on file  Tobacco Use   Smoking status: Former    Types: Cigarettes   Smokeless tobacco: Never  Vaping Use   Vaping status: Never Used  Substance and Sexual Activity   Alcohol use: No   Drug use: No   Sexual activity: Not Currently    Partners: Male    Birth control/protection: Abstinence, I.U.D.    Comment: Mirena  IUD-06/09/23  Other Topics Concern   Not on file  Social History Narrative   Work or School: Pharmacist, hospital      Home Situation: roommate      Spiritual Beliefs:mormon      Lifestyle: no regular exercise; horrible diet            Social Drivers of Health   Financial Resource Strain: Patient Declined (11/08/2023)   Overall Financial Resource Strain (CARDIA)    Difficulty of Paying Living Expenses: Patient declined  Food Insecurity: Patient Declined (11/08/2023)   Hunger Vital Sign    Worried About Running Out of Food in the Last Year: Patient declined    Ran Out of Food in the Last Year: Patient declined  Transportation Needs: No Transportation Needs (11/08/2023)   PRAPARE - Administrator, Civil Service (Medical): No    Lack of Transportation (Non-Medical): No  Physical Activity: Unknown (11/08/2023)   Exercise Vital Sign    Days of Exercise per Week: 0 days    Minutes of Exercise per Session: Not on file  Stress: Stress Concern Present (11/08/2023)   [employer] of Occupational Health - Occupational Stress Questionnaire    Feeling of Stress : Very much  Social Connections: Unknown (11/08/2023)   Social Connection and Isolation Panel [NHANES]    Frequency of Communication with Friends and Family: Once a week     Frequency of Social Gatherings with Friends and Family: Patient declined    Attends Religious Services: Never    Database administrator or Organizations: No    Attends Engineer, structural: Not on file    Marital Status: Never married   Past Surgical History:  Procedure Laterality Date   KIDNEY STONE SURGERY  09/21/2005   KNEE ARTHROSCOPY Right 09/21/2001   KNEE ARTHROSCOPY Right 09/21/2005   SACROILIAC JOINT FUSION  01/20/2022   SHOULDER ARTHROSCOPY Right 09/21/2005   SPINE SURGERY  01/2022   Sacroiliac Joint Fusion   WISDOM TOOTH EXTRACTION  Highschool   Past Surgical History:  Procedure Laterality Date   KIDNEY STONE SURGERY  09/21/2005   KNEE ARTHROSCOPY Right 09/21/2001   KNEE ARTHROSCOPY Right 09/21/2005   SACROILIAC JOINT FUSION  01/20/2022   SHOULDER ARTHROSCOPY Right 09/21/2005   SPINE SURGERY  01/2022   Sacroiliac Joint Fusion   WISDOM TOOTH EXTRACTION  Highschool   Past Medical History:  Diagnosis Date   Allergy    Anxiety  Arthritis    Bipolar 1 disorder (HCC)    Chronic pain    back, hip - seeing PMR   Depression    Dysmenorrhea    Hypertension    Neuromuscular disorder (HCC)    Obesity    Ht 5\' 5"  (1.651 m)   Wt 232 lb (105.2 kg)   BMI 38.61 kg/m   Opioid Risk Score:   Fall Risk Score:  `1  Depression screen [facility] 2/9     01/20/2024    2:56 PM 12/30/2023    9:20 AM 10/15/2023   10:35 AM 08/27/2023    9:16 AM 07/13/2023   10:25 AM 11/26/2022   10:27 AM 11/03/2022    9:12 AM  Depression screen PHQ 2/9  Decreased Interest 1 0 0 0 0 0 1  Down, Depressed, Hopeless 1 0 0 0 0 0 1  PHQ - 2 Score 2 0 0 0 0 0 2  Altered sleeping       3  Tired, decreased energy       2  Change in appetite       0  Feeling bad or failure about yourself        1  Trouble concentrating       1  Moving slowly or fidgety/restless       0  Suicidal thoughts       1  PHQ-9 Score       10  Difficult doing work/chores       Somewhat difficult    Review of Systems   Musculoskeletal:  Positive for back pain.       Pain in the right down to the right hip  All other systems reviewed and are negative.      Objective:   Physical Exam General No acute distress Mood and affect appropriate Extremities without edema Negative straight leg raising bilaterally Sensation normal in the lower extremities Strength is normal in lower extremities There is tenderness over the center part of the sacrum.  Sacral thrust: Positive Lateral compression: Negative FABER's: Negative Distraction (supine): Positive Thigh thrust test: Negative      Assessment & Plan:  1.  Acute exacerbation of chronic lumbosacral pain.  This has been going on for couple months now her MRI did not show any explanation for her lower extremity radiating pain.  No significant lumbar spinal stenosis.  Her pain today appears to be more sacral.  Does not appear to have much of lumbar component. Would like to have patient evaluated with Dr. Jackee Marus have instructed patient to call his office given that she is within 3 years of surgery, no referral necessary. Will DC Mobic  Start celecoxib  100 mg twice daily Discontinue cyclobenzaprine  Continue gabapentin  400 mg 3 times daily 2.  I do think patient has been less active than optimal.  This would also cause an exacerbation of her chronic pain.  Will make referral to aquatic therapy.  She states that if co-pays are excessive she may not be able to do this. Return to clinic in 2 months

## 2024-01-21 ENCOUNTER — Encounter: Payer: Self-pay | Admitting: Physical Medicine & Rehabilitation

## 2024-01-24 MED ORDER — LIDOCAINE 5 % EX PTCH: 2.0000 | MEDICATED_PATCH | CUTANEOUS | 1 refills | Status: DC

## 2024-02-15 ENCOUNTER — Encounter: Payer: Self-pay | Admitting: Physical Medicine & Rehabilitation

## 2024-02-15 ENCOUNTER — Other Ambulatory Visit: Payer: Self-pay | Admitting: Physical Medicine & Rehabilitation

## 2024-02-16 ENCOUNTER — Other Ambulatory Visit: Payer: Self-pay | Admitting: Physical Medicine & Rehabilitation

## 2024-02-18 ENCOUNTER — Telehealth: Admitting: Physical Medicine and Rehabilitation

## 2024-02-23 ENCOUNTER — Other Ambulatory Visit: Payer: Self-pay | Admitting: Physical Medicine & Rehabilitation

## 2024-03-09 ENCOUNTER — Encounter: Admitting: Physical Medicine & Rehabilitation

## 2024-03-14 ENCOUNTER — Other Ambulatory Visit: Payer: Self-pay | Admitting: Physical Medicine & Rehabilitation

## 2024-03-31 ENCOUNTER — Other Ambulatory Visit: Payer: Self-pay | Admitting: Family Medicine

## 2024-03-31 DIAGNOSIS — G4700 Insomnia, unspecified: Secondary | ICD-10-CM

## 2024-04-05 ENCOUNTER — Ambulatory Visit: Admitting: Family Medicine

## 2024-04-05 ENCOUNTER — Other Ambulatory Visit: Payer: Self-pay | Admitting: Physical Medicine & Rehabilitation

## 2024-04-13 ENCOUNTER — Encounter: Payer: Managed Care, Other (non HMO) | Admitting: Physical Medicine & Rehabilitation

## 2024-04-14 ENCOUNTER — Encounter: Payer: Self-pay | Admitting: Physical Medicine & Rehabilitation

## 2024-04-14 ENCOUNTER — Other Ambulatory Visit: Payer: Self-pay | Admitting: Physical Medicine & Rehabilitation

## 2024-04-16 ENCOUNTER — Other Ambulatory Visit: Payer: Self-pay | Admitting: Physical Medicine & Rehabilitation

## 2024-04-18 ENCOUNTER — Other Ambulatory Visit: Payer: Self-pay | Admitting: *Deleted

## 2024-04-19 ENCOUNTER — Encounter: Payer: Self-pay | Admitting: Family Medicine

## 2024-04-19 ENCOUNTER — Ambulatory Visit: Admitting: Family Medicine

## 2024-04-19 VITALS — BP 136/82 | HR 100 | Temp 98.9°F | Resp 12 | Ht 65.0 in | Wt 234.0 lb

## 2024-04-19 DIAGNOSIS — G4700 Insomnia, unspecified: Secondary | ICD-10-CM | POA: Diagnosis not present

## 2024-04-19 DIAGNOSIS — R7303 Prediabetes: Secondary | ICD-10-CM | POA: Diagnosis not present

## 2024-04-19 DIAGNOSIS — E66812 Obesity, class 2: Secondary | ICD-10-CM

## 2024-04-19 DIAGNOSIS — Z1211 Encounter for screening for malignant neoplasm of colon: Secondary | ICD-10-CM

## 2024-04-19 DIAGNOSIS — Z6839 Body mass index (BMI) 39.0-39.9, adult: Secondary | ICD-10-CM

## 2024-04-19 LAB — POCT GLYCOSYLATED HEMOGLOBIN (HGB A1C): Hemoglobin A1C: 5.6 % (ref 4.0–5.6)

## 2024-04-19 MED ORDER — ZOLPIDEM TARTRATE ER 12.5 MG PO TBCR: 12.5000 mg | EXTENDED_RELEASE_TABLET | Freq: Every evening | ORAL | 3 refills | Status: DC | PRN

## 2024-04-19 NOTE — Assessment & Plan Note (Signed)
 Problem is well-controlled. Continue Ambien  12.5 mg daily at bedtime as needed and doxepin  50 mg daily as needed. Good sleep hygiene is also recommended. Follow-up in 6 months, before if needed.

## 2024-04-19 NOTE — Patient Instructions (Signed)
 A few things to remember from today's visit:  Insomnia, unspecified type  Class 2 severe obesity with serious comorbidity and body mass index (BMI) of 39.0 to 39.9 in adult, unspecified obesity type (HCC)  Prediabetes  Colon cancer screening - Plan: Cologuard  No changes today. Let me know if you want to establish with healthy wt and wellness clinic.  If you need refills for medications you take chronically, please call your pharmacy. Do not use My Chart to request refills or for acute issues that need immediate attention. If you send a my chart message, it may take a few days to be addressed, specially if I am not in the office.  Please be sure medication list is accurate. If a new problem present, please set up appointment sooner than planned today.

## 2024-04-19 NOTE — Assessment & Plan Note (Signed)
 Last hemoglobin A1c 6.0 in 10/2023. Encouraged consistency with a healthy lifestyle for diabetes prevention. Today hemoglobin A1c improved, 5.6.

## 2024-04-19 NOTE — Assessment & Plan Note (Addendum)
 She has gained some weight since her last visit in 10/2023, about 9 pounds. Some of her medications can be contributing factors. We discussed the benefits of wt loss as well as adverse effects of obesity. Consistency with healthy diet and physical activity encouraged. For now she would like to hold on referral to healthy weight and wellness clinic, she will let me know if she changes up her mind.

## 2024-04-19 NOTE — Progress Notes (Signed)
 HPI: [name] is a 48 y.o. female with a PMHx significant for migraines, HTN, bipolar disorder, insomnia, and chronic pain, who is here today for chronic disease management.  Last seen on 11/08/2023 for annual visit, in the interim has been seen at Physical Medicine for chronic pain management.   Insomnia: Currently managed on Ambien  CR 12.5 mg nightly as needed and Doxepin  50 mg at bedtime as needed. She says this current regimen has been working fairly well w/o any side effects: she is sleeping 6-7 hours nightly, but does not wake up feeling well rested; regardless she would like to continue with this regimen. Denies daytime hypersomnolence  instances of falling asleep while driving, waking up in the middle of the night gasping for air/apnea mentioned by others. In the past she has tried different medications: trazodone, Ambien  IR, Lunesta, Abilify, Seroquel, Xanax, melatonin, and daridorexan but stopped due to ineffectiveness or side effects.  Obesity; BMI 39+: Also concerned about wt gain. She also mentions that lately she has been feeling hungrier though hasn't changed her diet much - is on a tight budget when it comes to food, so tries not to change up her purchases too much. She does not cook. Regarding her activity, she says that she does lots of walking at work, but no targeted exercises, and has been limited in activity s/p her back surgery in 2023.  Since January of this year when she weighed 229 lbs, she has gained 5 pounds and today weighs 234 lbs.  She is on Gabapentin  for lumbar radiculopathy.  Prediabetes: Negative for polyuria and polydipsia.  Lab Results  Component Value Date   HGBA1C 6.0 11/16/2023   Review of Systems  Constitutional:  Positive for appetite change (increased hunger) and fatigue. Negative for activity change and fever.  HENT:  Negative for sore throat and trouble swallowing.   Respiratory:  Negative for shortness of breath.    Cardiovascular:  Negative for chest pain, palpitations and leg swelling.  Gastrointestinal:  Negative for abdominal pain, nausea and vomiting.  Endocrine: Negative for cold intolerance and heat intolerance.  Genitourinary:  Negative for decreased urine volume, dysuria and hematuria.  Musculoskeletal:  Positive for back pain.  Neurological:  Negative for syncope, weakness and headaches.  Psychiatric/Behavioral:  Positive for sleep disturbance. Negative for confusion and hallucinations.   See other pertinent positives and negatives in HPI.  Current Outpatient Medications on File Prior to Visit  Medication Sig Dispense Refill   doxepin  (SINEQUAN ) 50 MG capsule Take 1 capsule (50 mg total) by mouth at bedtime as needed. 90 capsule 1   fluticasone  (FLONASE ) 50 MCG/ACT nasal spray SPRAY 2 SPRAYS IN EACH NOSTRIL ONCE DAILY AS NEEDED 16 mL 0   gabapentin  (NEURONTIN ) 400 MG capsule TAKE 1 CAPSULE BY MOUTH 3 TIMES A DAY 90 capsule 2   hydrocortisone  (ANUSOL -HC) 2.5 % rectal cream Place 1 Application rectally 2 (two) times daily. 30 g 2   ketoconazole  (NIZORAL ) 2 % cream Apply 1 Application topically 2 (two) times daily as needed for irritation. 15 g 0   levonorgestrel  (MIRENA ) 20 MCG/24HR IUD 1 each by Intrauterine route once.     lidocaine  (LIDODERM ) 5 % APPLY 2 PATCHES TO RIGHT SIDE LOW BACK FOR 12 HOURS IN A 24 HOUR PERIOD 60 patch 1   meloxicam  (MOBIC ) 15 MG tablet TAKE 1 TABLET BY MOUTH DAILY 30 tablet 1   Multiple Vitamin (MULTIVITAMIN WITH MINERALS) TABS tablet Take 1 tablet by mouth daily.  tizanidine  (ZANAFLEX ) 2 MG capsule Take 2 mg by mouth 3 (three) times daily as needed for muscle spasms.     tiZANidine  (ZANAFLEX ) 4 MG tablet TAKE 1 TABLET BY MOUTH EVERY 8 HOURS AS NEEDED FOR MUSCLE SPASMS. 90 tablet 3   traMADol  (ULTRAM ) 50 MG tablet TAKE 1 TABLET BY MOUTH 4 TIMES A DAY 120 tablet 5   zolpidem  (AMBIEN  CR) 12.5 MG CR tablet TAKE 1 TABLET BY MOUTH EVERY NIGHT AT BEDTIME AS NEEDED FOR  SLEEP 30 tablet 0   Current Facility-Administered Medications on File Prior to Visit  Medication Dose Route Frequency Provider Last Rate Last Admin   lidocaine  (XYLOCAINE ) 1 % (with pres) injection 5 mL  5 mL Infiltration Once         Past Medical History:  Diagnosis Date   Allergy    Anxiety    Arthritis    Bipolar 1 disorder (HCC)    Chronic pain    back, hip - seeing PMR   Depression    Dysmenorrhea    Hypertension    Neuromuscular disorder (HCC)    Obesity    Allergies  Allergen Reactions   Clindamycin/Lincomycin Nausea And Vomiting   Nubain [Nalbuphine Hcl] Other (See Comments)    *feels like something crawling on her*    Social History   Socioeconomic History   Marital status: Single    Spouse name: Not on file   Number of children: Not on file   Years of education: Not on file   Highest education level: Some college, no degree  Occupational History   Not on file  Tobacco Use   Smoking status: Former    Types: Cigarettes   Smokeless tobacco: Never  Vaping Use   Vaping status: Never Used  Substance and Sexual Activity   Alcohol use: No   Drug use: No   Sexual activity: Not Currently    Partners: Male    Birth control/protection: Abstinence, I.U.D.    Comment: Mirena  IUD-06/09/23  Other Topics Concern   Not on file  Social History Narrative   Work or School: Pharmacist, hospital      Home Situation: roommate      Spiritual Beliefs:mormon      Lifestyle: no regular exercise; horrible diet            Social Drivers of Health   Financial Resource Strain: Medium Risk (04/04/2024)   Overall Financial Resource Strain (CARDIA)    Difficulty of Paying Living Expenses: Somewhat hard  Food Insecurity: Patient Declined (04/04/2024)   Hunger Vital Sign    Worried About Running Out of Food in the Last Year: Patient declined    Ran Out of Food in the Last Year: Patient declined  Transportation Needs: No Transportation Needs (04/04/2024)   PRAPARE  - Administrator, Civil Service (Medical): No    Lack of Transportation (Non-Medical): No  Physical Activity: Inactive (04/04/2024)   Exercise Vital Sign    Days of Exercise per Week: 0 days    Minutes of Exercise per Session: Not on file  Stress: Stress Concern Present (04/04/2024)   [employer] of Occupational Health - Occupational Stress Questionnaire    Feeling of Stress: Rather much  Social Connections: Unknown (04/04/2024)   Social Connection and Isolation Panel    Frequency of Communication with Friends and Family: Once a week    Frequency of Social Gatherings with Friends and Family: Patient declined    Attends Religious Services: Never  Active Member of Clubs or Organizations: No    Attends Banker Meetings: Not on file    Marital Status: Never married    Vitals:   04/19/24 1006  BP: 136/82  Pulse: 100  Resp: 12  Temp: 98.9 F (37.2 C)  SpO2: 99%   Body mass index is 38.94 kg/m.  Physical Exam Vitals and nursing note reviewed.  Constitutional:      General: She is not in acute distress.    Appearance: She is well-developed.  HENT:     Head: Normocephalic and atraumatic.     Mouth/Throat:     Mouth: Mucous membranes are moist.     Pharynx: Oropharynx is clear.  Eyes:     Conjunctiva/sclera: Conjunctivae normal.  Cardiovascular:     Rate and Rhythm: Normal rate and regular rhythm.     Heart sounds: No murmur heard. Pulmonary:     Effort: Pulmonary effort is normal. No respiratory distress.     Breath sounds: Normal breath sounds.  Abdominal:     Palpations: Abdomen is soft. There is no mass.     Tenderness: There is no abdominal tenderness.  Musculoskeletal:     Right lower leg: No edema.     Left lower leg: No edema.  Skin:    General: Skin is warm.     Findings: No erythema or rash.  Neurological:     General: No focal deficit present.     Mental Status: She is alert and oriented to person, place, and time.     Gait:  Gait normal.  Psychiatric:        Mood and Affect: Mood and affect normal.   ASSESSMENT AND PLAN: [name] was seen here today for chronic disease management.  Orders Placed This Encounter  Procedures   Cologuard   POC HgB A1c   Lab Results  Component Value Date   HGBA1C 5.6 04/19/2024   Insomnia, unspecified type Assessment & Plan: Problem is well-controlled. Continue Ambien  12.5 mg daily at bedtime as needed and doxepin  50 mg daily as needed. Good sleep hygiene is also recommended. Follow-up in 6 months, before if needed.   Class 2 severe obesity with serious comorbidity and body mass index (BMI) of 39.0 to 39.9 in adult, unspecified obesity type [facility]) Assessment & Plan: She has gained some weight since her last visit in 10/2023, about 9 pounds. Some of her medications can be contributing factors. We discussed the benefits of wt loss as well as adverse effects of obesity. Consistency with healthy diet and physical activity encouraged. For now she would like to hold on referral to healthy weight and wellness clinic, she will let me know if she changes up her mind.   Prediabetes Assessment & Plan: Last hemoglobin A1c 6.0 in 10/2023. Encouraged consistency with a healthy lifestyle for diabetes prevention. Today hemoglobin A1c improved, 5.6.  Orders: -     POCT glycosylated hemoglobin (Hb A1C)  Colon cancer screening -     Cologuard   Return in about 6 months (around 10/20/2024) for CPE, Labs.  I,Emily Lagle,acting as a Neurosurgeon for Cheryl Stabenow [place], MD.,have documented all relevant documentation on the behalf of Gizelle Whetsel [place], MD,as directed by  Katielynn Horan [place], MD while in the presence of Janne Faulk [place], MD.  I, Marianela Mandrell [place], MD, have reviewed all documentation for this visit. The documentation on 04/19/24 for the exam, diagnosis, procedures, and orders are all accurate and complete. Darril Patriarca G. [place], MD  [facility] [place]. [place] [facility].

## 2024-04-26 ENCOUNTER — Other Ambulatory Visit: Payer: Self-pay | Admitting: Physical Medicine & Rehabilitation

## 2024-04-26 ENCOUNTER — Other Ambulatory Visit: Payer: Self-pay | Admitting: Family Medicine

## 2024-05-02 ENCOUNTER — Encounter: Attending: Physical Medicine & Rehabilitation | Admitting: Physical Medicine & Rehabilitation

## 2024-05-02 ENCOUNTER — Encounter: Payer: Self-pay | Admitting: Physical Medicine & Rehabilitation

## 2024-05-02 VITALS — BP 174/100 | HR 73 | Ht 65.0 in | Wt 234.4 lb

## 2024-05-02 DIAGNOSIS — M533 Sacrococcygeal disorders, not elsewhere classified: Secondary | ICD-10-CM | POA: Diagnosis present

## 2024-05-02 MED ORDER — GABAPENTIN 400 MG PO CAPS: 400.0000 mg | ORAL_CAPSULE | Freq: Three times a day (TID) | ORAL | 2 refills | Status: DC

## 2024-05-02 MED ORDER — LIDOCAINE 5 % EX PTCH: 2.0000 | MEDICATED_PATCH | CUTANEOUS | 1 refills | Status: DC

## 2024-05-02 MED ORDER — GABAPENTIN 600 MG PO TABS: 600.0000 mg | ORAL_TABLET | Freq: Three times a day (TID) | ORAL | 1 refills | Status: DC

## 2024-05-02 MED ORDER — MELOXICAM 15 MG PO TABS: 15.0000 mg | ORAL_TABLET | Freq: Every day | ORAL | 2 refills | Status: DC

## 2024-05-02 NOTE — Patient Instructions (Signed)
 Back Exercises These exercises help to make your trunk and back strong. They also help to keep the lower back flexible. Doing these exercises can help to prevent or lessen pain in your lower back. If you have back pain, try to do these exercises 2-3 times each day or as told by your doctor. As you get better, do the exercises once each day. Repeat the exercises more often as told by your doctor. To stop back pain from coming back, do the exercises once each day, or as told by your doctor. Do exercises exactly as told by your doctor. Stop right away if you feel sudden pain or your pain gets worse. Exercises Single knee to chest Do these steps 3-5 times in a row for each leg: Lie on your back on a firm bed or the floor with your legs stretched out. Bring one knee to your chest. Grab your knee or thigh with both hands and hold it in place. Pull on your knee until you feel a gentle stretch in your lower back or butt. Keep doing the stretch for 10-30 seconds. Slowly let go of your leg and straighten it. Pelvic tilt Do these steps 5-10 times in a row: Lie on your back on a firm bed or the floor with your legs stretched out. Bend your knees so they point up to the ceiling. Your feet should be flat on the floor. Tighten your lower belly (abdomen) muscles to press your lower back against the floor. This will make your tailbone point up to the ceiling instead of pointing down to your feet or the floor. Stay in this position for 5-10 seconds while you gently tighten your muscles and breathe evenly. Cat-cow Do these steps until your lower back bends more easily: Get on your hands and knees on a firm bed or the floor. Keep your hands under your shoulders, and keep your knees under your hips. You may put padding under your knees. Let your head hang down toward your chest. Tighten (contract) the muscles in your belly. Point your tailbone toward the floor so your lower back becomes rounded like the back of a  cat. Stay in this position for 5 seconds. Slowly lift your head. Let the muscles of your belly relax. Point your tailbone up toward the ceiling so your back forms a sagging arch like the back of a cow. Stay in this position for 5 seconds.  Press-ups Do these steps 5-10 times in a row: Lie on your belly (face-down) on a firm bed or the floor. Place your hands near your head, about shoulder-width apart. While you keep your back relaxed and keep your hips on the floor, slowly straighten your arms to raise the top half of your body and lift your shoulders. Do not use your back muscles. You may change where you place your hands to make yourself more comfortable. Stay in this position for 5 seconds. Keep your back relaxed. Slowly return to lying flat on the floor.  Bridges Do these steps 10 times in a row: Lie on your back on a firm bed or the floor. Bend your knees so they point up to the ceiling. Your feet should be flat on the floor. Your arms should be flat at your sides, next to your body. Tighten your butt muscles and lift your butt off the floor until your waist is almost as high as your knees. If you do not feel the muscles working in your butt and the back of  your thighs, slide your feet 1-2 inches (2.5-5 cm) farther away from your butt. Stay in this position for 3-5 seconds. Slowly lower your butt to the floor, and let your butt muscles relax. If this exercise is too easy, try doing it with your arms crossed over your chest. Belly crunches Do these steps 5-10 times in a row: Lie on your back on a firm bed or the floor with your legs stretched out. Bend your knees so they point up to the ceiling. Your feet should be flat on the floor. Cross your arms over your chest. Tip your chin a little bit toward your chest, but do not bend your neck. Tighten your belly muscles and slowly raise your chest just enough to lift your shoulder blades a tiny bit off the floor. Avoid raising your body  higher than that because it can put too much stress on your lower back. Slowly lower your chest and your head to the floor. Back lifts Do these steps 5-10 times in a row: Lie on your belly (face-down) with your arms at your sides, and rest your forehead on the floor. Tighten the muscles in your legs and your butt. Slowly lift your chest off the floor while you keep your hips on the floor. Keep the back of your head in line with the curve in your back. Look at the floor while you do this. Stay in this position for 3-5 seconds. Slowly lower your chest and your face to the floor. Contact a doctor if: Your back pain gets a lot worse when you do an exercise. Your back pain does not get better within 2 hours after you exercise. If you have any of these problems, stop doing the exercises. Do not do them again unless your doctor says it is okay. Get help right away if: You have sudden, very bad back pain. If this happens, stop doing the exercises. Do not do them again unless your doctor says it is okay. This information is not intended to replace advice given to you by your health care provider. Make sure you discuss any questions you have with your health care provider. Document Revised: 11/20/2020 Document Reviewed: 11/20/2020 Elsevier Patient Education  2024 [employer].

## 2024-05-02 NOTE — Progress Notes (Signed)
 Subjective:    Patient ID: Maria Powers, female    DOB: 02-24-1976, 48 y.o.   MRN: [id number]  HPI  48 year old female with history of chronic right sacroiliac dysfunction.  Her pain improved short-term with sacroiliac injections under fluoroscopic guidance.  She later had benefit from sacroiliac radiofrequency neurotomies however after insurance no longer cover this procedure, the patient was not able to have these.  The patient then was referred to Ortho spine Dr. Beuford for right SI fusion which she underwent.  For the most part her pain has gotten better.  She does have some residual pain in that area and more recently has developed some increasing coccygeal pain.  She denies any falls or trauma to that area.  In terms of medications she tried Celebrex  which caused GI upset and was switched back to meloxicam  15 mg a day which she has been doing better In addition the patient was switched from tizanidine  to cyclobenzaprine  but this was not helpful therefore she was switched back to tizanidine  and takes 4 mg 3 times daily although while at work she only takes 2 mg. He feels like her coccygeal pain is somewhat improved with gabapentin  but has at times needing extra doses for this.  Her dose right now is 400 g 3 times daily but was wondering if this can be increased. She remains on tramadol  1 tablet 4 times a day. Last urine toxicology/06/2024 was consistent with medications prescribed Pain Inventory Average Pain 6 Pain Right Now 5 My pain is constant, sharp, dull, tingling, and aching  In the last 24 hours, has pain interfered with the following? General activity 5 Relation with others 5 Enjoyment of life 5 What TIME of day is your pain at its worst? varies Sleep (in general) Fair  Pain is worse with: unsure Pain improves with: medication Relief from Meds: 5  Family History  Problem Relation Age of Onset   Addison's disease Mother    Diabetes Mother    Bipolar disorder Mother     Depression Mother    Early death Mother    Obesity Mother    Depression Sister    Vision loss Maternal Grandmother    Fibroids Maternal Aunt    Depression Sister    Social History   Socioeconomic History   Marital status: Single    Spouse name: Not on file   Number of children: Not on file   Years of education: Not on file   Highest education level: Some college, no degree  Occupational History   Not on file  Tobacco Use   Smoking status: Former    Types: Cigarettes   Smokeless tobacco: Never  Vaping Use   Vaping status: Never Used  Substance and Sexual Activity   Alcohol use: No   Drug use: No   Sexual activity: Not Currently    Partners: Male    Birth control/protection: Abstinence, I.U.D.    Comment: Mirena  IUD-06/09/23  Other Topics Concern   Not on file  Social History Narrative   Work or School: Pharmacist, hospital      Home Situation: roommate      Spiritual Beliefs:mormon      Lifestyle: no regular exercise; horrible diet            Social Drivers of Health   Financial Resource Strain: Medium Risk (04/04/2024)   Overall Financial Resource Strain (CARDIA)    Difficulty of Paying Living Expenses: Somewhat hard  Food Insecurity: Patient Declined (04/04/2024)  Hunger Vital Sign    Worried About Running Out of Food in the Last Year: Patient declined    Ran Out of Food in the Last Year: Patient declined  Transportation Needs: No Transportation Needs (04/04/2024)   PRAPARE - Administrator, Civil Service (Medical): No    Lack of Transportation (Non-Medical): No  Physical Activity: Inactive (04/04/2024)   Exercise Vital Sign    Days of Exercise per Week: 0 days    Minutes of Exercise per Session: Not on file  Stress: Stress Concern Present (04/04/2024)   [employer] of Occupational Health - Occupational Stress Questionnaire    Feeling of Stress: Rather much  Social Connections: Unknown (04/04/2024)   Social Connection and  Isolation Panel    Frequency of Communication with Friends and Family: Once a week    Frequency of Social Gatherings with Friends and Family: Patient declined    Attends Religious Services: Never    Database administrator or Organizations: No    Attends Engineer, structural: Not on file    Marital Status: Never married   Past Surgical History:  Procedure Laterality Date   KIDNEY STONE SURGERY  09/21/2005   KNEE ARTHROSCOPY Right 09/21/2001   KNEE ARTHROSCOPY Right 09/21/2005   SACROILIAC JOINT FUSION  01/20/2022   SHOULDER ARTHROSCOPY Right 09/21/2005   SPINE SURGERY  01/2022   Sacroiliac Joint Fusion   WISDOM TOOTH EXTRACTION  Highschool   Past Surgical History:  Procedure Laterality Date   KIDNEY STONE SURGERY  09/21/2005   KNEE ARTHROSCOPY Right 09/21/2001   KNEE ARTHROSCOPY Right 09/21/2005   SACROILIAC JOINT FUSION  01/20/2022   SHOULDER ARTHROSCOPY Right 09/21/2005   SPINE SURGERY  01/2022   Sacroiliac Joint Fusion   WISDOM TOOTH EXTRACTION  Highschool   Past Medical History:  Diagnosis Date   Allergy    Anxiety    Arthritis    Bipolar 1 disorder (HCC)    Chronic pain    back, hip - seeing PMR   Depression    Dysmenorrhea    Hypertension    Neuromuscular disorder (HCC)    Obesity    BP (!) 166/102 (BP Location: Left Arm, Patient Position: Sitting, Cuff Size: Large)   Pulse 73   Ht 5' 5 (1.651 m)   Wt 234 lb 6.4 oz (106.3 kg)   SpO2 98%   BMI 39.01 kg/m   Opioid Risk Score:   Fall Risk Score:  `1  Depression screen [facility] 2/9     05/02/2024    1:09 PM 01/20/2024    2:56 PM 12/30/2023    9:20 AM 10/15/2023   10:35 AM 08/27/2023    9:16 AM 07/13/2023   10:25 AM 11/26/2022   10:27 AM  Depression screen PHQ 2/9  Decreased Interest 0 1 0 0 0 0 0  Down, Depressed, Hopeless 0 1 0 0 0 0 0  PHQ - 2 Score 0 2 0 0 0 0 0     Review of Systems  Musculoskeletal:  Positive for back pain and myalgias.       Right back pain with sciatic pain radiating down  right leg  All other systems reviewed and are negative.       Objective:   Physical Exam  General no acute distress Mood and affect appropriate Motor strength is 5/5 in the hip flexor knee extensor ankle dorsiflexor Negative straight leg raising bilaterally Tone is normal bilateral lower extremities Tenderness over the right PSIS There  is also some tenderness over the coccygeal area to palpation.  Mild tenderness over the right gluteus area. Ambulates without assistive device no evidence of toe drag or knee instability. Sacral thrust (prone) : Positive right  FABER's: Positive right Distraction (supine): Negative Thigh thrust test: Negative     Assessment & Plan:   1.  Chronic sacroiliac pain she still has some residual pain after sacroiliac fusion.  We discussed my recommendation for orthopedic follow-up with Dr. Beuford.  Her last x-rays in December 2024 showed no displacement of hardware. 2.  Coccygeal pain no trauma this may be radiating pain from the SI joint but is a relatively new complaint.  Will check x-rays of this.  This has been going on for a couple months. 3.  Lumbar degenerative disc she has potential compression of right L2 nerve root although I do not believe her symptoms reflect this.  She does have some left-sided L4 nerve root impingement but does not have any symptoms on that side.  At this point I really do not think her pain is from nerve root compression. I will see her back in 3 months We discussed physical therapy but she states that her co-pays are very high and she cannot afford this I have given her some back exercises and told her not to do any hyperextension of the back

## 2024-05-11 ENCOUNTER — Encounter: Payer: Self-pay | Admitting: Family Medicine

## 2024-05-15 NOTE — Telephone Encounter (Signed)
 Copied from CRM #[id number]. Topic: Clinical - Medication Question >> May 15, 2024  4:58 PM Jasmin G wrote: Reason for CRM: Pt called regarding the status of her prednisone  request, please refer to Encounter on 8/21, she states that her pharmacy sent over 2 faxes to request the med but has not gotten a response back, I relayed the info that I last seen, being that the info was routed over to her PCP, Ms. [place] with no response back. Please call her back at [phone] to give her a status.

## 2024-05-23 LAB — COLOGUARD: COLOGUARD: NEGATIVE

## 2024-05-24 ENCOUNTER — Ambulatory Visit: Payer: Self-pay | Admitting: Family Medicine

## 2024-06-27 ENCOUNTER — Other Ambulatory Visit: Payer: Self-pay | Admitting: Physical Medicine & Rehabilitation

## 2024-06-28 ENCOUNTER — Other Ambulatory Visit: Payer: Self-pay | Admitting: Physical Medicine & Rehabilitation

## 2024-07-08 ENCOUNTER — Other Ambulatory Visit: Payer: Self-pay | Admitting: Physical Medicine & Rehabilitation

## 2024-08-09 ENCOUNTER — Other Ambulatory Visit: Payer: Self-pay | Admitting: Physical Medicine & Rehabilitation

## 2024-08-19 ENCOUNTER — Other Ambulatory Visit: Payer: Self-pay | Admitting: Physical Medicine & Rehabilitation

## 2024-08-23 ENCOUNTER — Other Ambulatory Visit: Payer: Self-pay | Admitting: Family Medicine

## 2024-08-23 DIAGNOSIS — G4700 Insomnia, unspecified: Secondary | ICD-10-CM

## 2024-08-24 NOTE — Telephone Encounter (Signed)
 Second request from the [employer]

## 2024-08-29 ENCOUNTER — Other Ambulatory Visit: Payer: Self-pay | Admitting: Physical Medicine & Rehabilitation

## 2024-09-27 ENCOUNTER — Telehealth: Payer: Self-pay

## 2024-09-27 NOTE — Telephone Encounter (Signed)
 PA FOR LIDOCAINE  PATCH CREATED IN COVER MY MED

## 2024-09-29 NOTE — Telephone Encounter (Signed)
 PA for Lidocaine  5 % patch denied twice.  Patient has been informed.   Insurance will only approve it for  back pain relief associated with post herpetic neuralgia PHN.  A copy of the decision is in Dr. Clorinda  box for review. Patient has been informed.

## 2024-10-18 ENCOUNTER — Other Ambulatory Visit: Payer: Self-pay | Admitting: Physical Medicine & Rehabilitation

## 2024-11-02 ENCOUNTER — Ambulatory Visit: Admitting: Physical Medicine & Rehabilitation
